# Patient Record
Sex: Female | Born: 1960 | Race: Black or African American | Hispanic: No | Marital: Single | Smoking: Never smoker
Health system: Southern US, Community
[De-identification: ages and names within clinical notes are randomized; demographics above are authoritative.]

## PROBLEM LIST (undated history)

## (undated) DIAGNOSIS — E039 Hypothyroidism, unspecified: Secondary | ICD-10-CM

## (undated) DIAGNOSIS — E079 Disorder of thyroid, unspecified: Secondary | ICD-10-CM

## (undated) DIAGNOSIS — K219 Gastro-esophageal reflux disease without esophagitis: Secondary | ICD-10-CM

## (undated) DIAGNOSIS — E785 Hyperlipidemia, unspecified: Secondary | ICD-10-CM

## (undated) DIAGNOSIS — G4733 Obstructive sleep apnea (adult) (pediatric): Secondary | ICD-10-CM

## (undated) DIAGNOSIS — M1990 Unspecified osteoarthritis, unspecified site: Secondary | ICD-10-CM

## (undated) HISTORY — PX: CHOLECYSTECTOMY: SHX55

## (undated) HISTORY — PX: ABDOMINAL HYSTERECTOMY: SHX81

---

## 1998-03-23 ENCOUNTER — Other Ambulatory Visit: Admission: RE | Admit: 1998-03-23 | Discharge: 1998-03-23 | Payer: Self-pay | Admitting: Internal Medicine

## 1998-05-13 ENCOUNTER — Other Ambulatory Visit: Admission: RE | Admit: 1998-05-13 | Discharge: 1998-05-13 | Payer: Self-pay | Admitting: Family Medicine

## 1998-08-08 ENCOUNTER — Emergency Department (HOSPITAL_COMMUNITY): Admission: EM | Admit: 1998-08-08 | Discharge: 1998-08-08 | Payer: Self-pay | Admitting: Emergency Medicine

## 1998-08-09 ENCOUNTER — Inpatient Hospital Stay (HOSPITAL_COMMUNITY): Admission: EM | Admit: 1998-08-09 | Discharge: 1998-08-12 | Payer: Self-pay | Admitting: Emergency Medicine

## 1998-11-03 ENCOUNTER — Emergency Department (HOSPITAL_COMMUNITY): Admission: EM | Admit: 1998-11-03 | Discharge: 1998-11-03 | Payer: Self-pay | Admitting: Emergency Medicine

## 1998-11-03 ENCOUNTER — Encounter: Payer: Self-pay | Admitting: Emergency Medicine

## 1999-10-02 ENCOUNTER — Emergency Department (HOSPITAL_COMMUNITY): Admission: EM | Admit: 1999-10-02 | Discharge: 1999-10-02 | Payer: Self-pay | Admitting: Emergency Medicine

## 2000-01-20 ENCOUNTER — Other Ambulatory Visit: Admission: RE | Admit: 2000-01-20 | Discharge: 2000-01-20 | Payer: Self-pay | Admitting: Family Medicine

## 2000-02-29 ENCOUNTER — Emergency Department (HOSPITAL_COMMUNITY): Admission: EM | Admit: 2000-02-29 | Discharge: 2000-02-29 | Payer: Self-pay | Admitting: Emergency Medicine

## 2001-12-19 ENCOUNTER — Emergency Department (HOSPITAL_COMMUNITY): Admission: EM | Admit: 2001-12-19 | Discharge: 2001-12-19 | Payer: Self-pay

## 2003-05-11 ENCOUNTER — Emergency Department (HOSPITAL_COMMUNITY): Admission: EM | Admit: 2003-05-11 | Discharge: 2003-05-11 | Payer: Self-pay | Admitting: Emergency Medicine

## 2003-05-11 ENCOUNTER — Encounter: Payer: Self-pay | Admitting: *Deleted

## 2003-09-14 ENCOUNTER — Emergency Department (HOSPITAL_COMMUNITY): Admission: AD | Admit: 2003-09-14 | Discharge: 2003-09-14 | Payer: Self-pay | Admitting: Emergency Medicine

## 2003-09-14 ENCOUNTER — Encounter: Payer: Self-pay | Admitting: Emergency Medicine

## 2004-08-23 ENCOUNTER — Ambulatory Visit: Payer: Self-pay | Admitting: Family Medicine

## 2004-08-24 ENCOUNTER — Ambulatory Visit (HOSPITAL_COMMUNITY): Admission: RE | Admit: 2004-08-24 | Discharge: 2004-08-24 | Payer: Self-pay | Admitting: Internal Medicine

## 2004-09-19 ENCOUNTER — Emergency Department (HOSPITAL_COMMUNITY): Admission: EM | Admit: 2004-09-19 | Discharge: 2004-09-20 | Payer: Self-pay | Admitting: Emergency Medicine

## 2005-03-02 ENCOUNTER — Emergency Department (HOSPITAL_COMMUNITY): Admission: EM | Admit: 2005-03-02 | Discharge: 2005-03-02 | Payer: Self-pay | Admitting: Emergency Medicine

## 2005-05-29 ENCOUNTER — Ambulatory Visit: Payer: Self-pay | Admitting: Family Medicine

## 2005-08-14 ENCOUNTER — Ambulatory Visit: Payer: Self-pay | Admitting: Family Medicine

## 2005-11-15 ENCOUNTER — Ambulatory Visit: Payer: Self-pay | Admitting: Family Medicine

## 2006-02-01 ENCOUNTER — Ambulatory Visit: Payer: Self-pay | Admitting: Family Medicine

## 2006-03-19 ENCOUNTER — Ambulatory Visit: Payer: Self-pay | Admitting: Family Medicine

## 2006-05-18 ENCOUNTER — Ambulatory Visit: Payer: Self-pay | Admitting: Family Medicine

## 2006-06-26 ENCOUNTER — Ambulatory Visit: Payer: Self-pay | Admitting: Family Medicine

## 2006-07-17 ENCOUNTER — Emergency Department (HOSPITAL_COMMUNITY): Admission: EM | Admit: 2006-07-17 | Discharge: 2006-07-17 | Payer: Self-pay | Admitting: Emergency Medicine

## 2006-10-03 ENCOUNTER — Ambulatory Visit: Payer: Self-pay | Admitting: Family Medicine

## 2006-10-08 ENCOUNTER — Ambulatory Visit (HOSPITAL_COMMUNITY): Admission: RE | Admit: 2006-10-08 | Discharge: 2006-10-08 | Payer: Self-pay | Admitting: Internal Medicine

## 2006-10-11 ENCOUNTER — Ambulatory Visit: Payer: Self-pay | Admitting: Family Medicine

## 2006-11-29 ENCOUNTER — Ambulatory Visit: Payer: Self-pay | Admitting: Obstetrics and Gynecology

## 2007-01-31 ENCOUNTER — Ambulatory Visit: Payer: Self-pay | Admitting: Obstetrics & Gynecology

## 2007-03-25 ENCOUNTER — Ambulatory Visit: Payer: Self-pay | Admitting: Family Medicine

## 2007-04-11 ENCOUNTER — Ambulatory Visit: Payer: Self-pay | Admitting: Family Medicine

## 2007-04-25 ENCOUNTER — Ambulatory Visit: Payer: Self-pay | Admitting: Family Medicine

## 2007-05-27 ENCOUNTER — Ambulatory Visit: Payer: Self-pay | Admitting: Family Medicine

## 2007-06-26 ENCOUNTER — Ambulatory Visit: Payer: Self-pay | Admitting: Obstetrics and Gynecology

## 2007-07-02 ENCOUNTER — Ambulatory Visit (HOSPITAL_COMMUNITY): Admission: RE | Admit: 2007-07-02 | Discharge: 2007-07-02 | Payer: Self-pay | Admitting: Obstetrics and Gynecology

## 2007-07-03 ENCOUNTER — Ambulatory Visit: Payer: Self-pay | Admitting: Obstetrics & Gynecology

## 2007-08-08 ENCOUNTER — Ambulatory Visit: Payer: Self-pay | Admitting: Gynecology

## 2007-08-08 ENCOUNTER — Other Ambulatory Visit: Admission: RE | Admit: 2007-08-08 | Discharge: 2007-08-08 | Payer: Self-pay | Admitting: Gynecology

## 2007-08-08 ENCOUNTER — Encounter (INDEPENDENT_AMBULATORY_CARE_PROVIDER_SITE_OTHER): Payer: Self-pay | Admitting: Gynecology

## 2007-08-17 ENCOUNTER — Emergency Department (HOSPITAL_COMMUNITY): Admission: EM | Admit: 2007-08-17 | Discharge: 2007-08-17 | Payer: Self-pay | Admitting: Emergency Medicine

## 2007-08-26 ENCOUNTER — Emergency Department (HOSPITAL_COMMUNITY): Admission: EM | Admit: 2007-08-26 | Discharge: 2007-08-26 | Payer: Self-pay | Admitting: Emergency Medicine

## 2007-09-04 ENCOUNTER — Ambulatory Visit: Payer: Self-pay | Admitting: Internal Medicine

## 2007-09-16 ENCOUNTER — Ambulatory Visit: Payer: Self-pay | Admitting: Gynecology

## 2007-09-16 ENCOUNTER — Inpatient Hospital Stay (HOSPITAL_COMMUNITY): Admission: RE | Admit: 2007-09-16 | Discharge: 2007-09-18 | Payer: Self-pay | Admitting: Gynecology

## 2007-09-16 ENCOUNTER — Encounter (INDEPENDENT_AMBULATORY_CARE_PROVIDER_SITE_OTHER): Payer: Self-pay | Admitting: Gynecology

## 2007-09-19 ENCOUNTER — Ambulatory Visit: Payer: Self-pay | Admitting: *Deleted

## 2007-09-19 ENCOUNTER — Inpatient Hospital Stay (HOSPITAL_COMMUNITY): Admission: AD | Admit: 2007-09-19 | Discharge: 2007-09-19 | Payer: Self-pay | Admitting: Family Medicine

## 2007-10-02 ENCOUNTER — Ambulatory Visit: Payer: Self-pay | Admitting: Obstetrics & Gynecology

## 2007-10-30 ENCOUNTER — Ambulatory Visit: Payer: Self-pay | Admitting: Obstetrics & Gynecology

## 2008-01-20 ENCOUNTER — Ambulatory Visit: Payer: Self-pay | Admitting: Internal Medicine

## 2008-02-05 ENCOUNTER — Ambulatory Visit: Payer: Self-pay | Admitting: Family Medicine

## 2008-07-24 ENCOUNTER — Inpatient Hospital Stay (HOSPITAL_COMMUNITY): Admission: EM | Admit: 2008-07-24 | Discharge: 2008-07-26 | Payer: Self-pay | Admitting: Emergency Medicine

## 2008-08-05 ENCOUNTER — Ambulatory Visit: Payer: Self-pay | Admitting: Obstetrics & Gynecology

## 2008-08-11 ENCOUNTER — Ambulatory Visit (HOSPITAL_COMMUNITY): Admission: RE | Admit: 2008-08-11 | Discharge: 2008-08-11 | Payer: Self-pay | Admitting: Obstetrics and Gynecology

## 2008-09-01 ENCOUNTER — Ambulatory Visit: Payer: Self-pay | Admitting: Internal Medicine

## 2008-09-01 LAB — CONVERTED CEMR LAB
Cholesterol: 224 mg/dL — ABNORMAL HIGH (ref 0–200)
HDL: 48 mg/dL (ref >39)
LDL Cholesterol: 146 mg/dL — ABNORMAL HIGH (ref 0–99)
TSH: 25.069 microintl units/mL — ABNORMAL HIGH (ref 0.350–4.50)
Total CHOL/HDL Ratio: 4.7
Triglycerides: 148 mg/dL (ref <150)
VLDL: 30 mg/dL (ref 0–40)

## 2008-10-08 ENCOUNTER — Ambulatory Visit: Payer: Self-pay | Admitting: Obstetrics and Gynecology

## 2009-01-30 ENCOUNTER — Emergency Department (HOSPITAL_COMMUNITY): Admission: EM | Admit: 2009-01-30 | Discharge: 2009-01-30 | Payer: Self-pay | Admitting: Emergency Medicine

## 2009-02-16 ENCOUNTER — Ambulatory Visit: Payer: Self-pay | Admitting: Sports Medicine

## 2009-02-16 DIAGNOSIS — E039 Hypothyroidism, unspecified: Secondary | ICD-10-CM | POA: Insufficient documentation

## 2009-02-16 DIAGNOSIS — S92919A Unspecified fracture of unspecified toe(s), initial encounter for closed fracture: Secondary | ICD-10-CM | POA: Insufficient documentation

## 2009-02-17 ENCOUNTER — Ambulatory Visit (HOSPITAL_COMMUNITY): Admission: RE | Admit: 2009-02-17 | Discharge: 2009-02-17 | Payer: Self-pay | Admitting: *Deleted

## 2009-03-02 ENCOUNTER — Ambulatory Visit: Payer: Self-pay | Admitting: Sports Medicine

## 2009-03-18 ENCOUNTER — Ambulatory Visit (HOSPITAL_COMMUNITY): Admission: RE | Admit: 2009-03-18 | Discharge: 2009-03-18 | Payer: Self-pay | Admitting: *Deleted

## 2009-03-18 ENCOUNTER — Ambulatory Visit: Payer: Self-pay | Admitting: Sports Medicine

## 2009-03-23 ENCOUNTER — Ambulatory Visit: Payer: Self-pay | Admitting: Family Medicine

## 2009-03-23 LAB — CONVERTED CEMR LAB
ALT: 13 units/L (ref 0–35)
AST: 13 units/L (ref 0–37)
Albumin: 3.8 g/dL (ref 3.5–5.2)
Alkaline Phosphatase: 28 units/L — ABNORMAL LOW (ref 39–117)
BUN: 11 mg/dL (ref 6–23)
CO2: 20 meq/L (ref 19–32)
Calcium: 9.3 mg/dL (ref 8.4–10.5)
Chloride: 109 meq/L (ref 96–112)
Creatinine, Ser: 0.71 mg/dL (ref 0.40–1.20)
Glucose, Bld: 101 mg/dL — ABNORMAL HIGH (ref 70–99)
Potassium: 3.7 meq/L (ref 3.5–5.3)
Sodium: 140 meq/L (ref 135–145)
TSH: 0.01 microintl units/mL — ABNORMAL LOW (ref 0.350–4.500)
Total Bilirubin: 0.3 mg/dL (ref 0.3–1.2)
Total Protein: 7.2 g/dL (ref 6.0–8.3)
Vit D, 25-Hydroxy: 6 ng/mL — ABNORMAL LOW (ref 30–89)

## 2009-04-01 ENCOUNTER — Ambulatory Visit (HOSPITAL_COMMUNITY): Admission: RE | Admit: 2009-04-01 | Discharge: 2009-04-01 | Payer: Self-pay | Admitting: *Deleted

## 2009-04-01 ENCOUNTER — Ambulatory Visit: Payer: Self-pay | Admitting: Sports Medicine

## 2009-04-15 ENCOUNTER — Ambulatory Visit: Payer: Self-pay | Admitting: Sports Medicine

## 2009-04-27 ENCOUNTER — Ambulatory Visit (HOSPITAL_COMMUNITY): Admission: RE | Admit: 2009-04-27 | Discharge: 2009-04-27 | Payer: Self-pay | Admitting: *Deleted

## 2009-04-27 ENCOUNTER — Ambulatory Visit: Payer: Self-pay | Admitting: Family Medicine

## 2009-04-28 ENCOUNTER — Encounter (INDEPENDENT_AMBULATORY_CARE_PROVIDER_SITE_OTHER): Payer: Self-pay | Admitting: Adult Health

## 2009-04-28 LAB — CONVERTED CEMR LAB
ALT: 18 units/L (ref 0–35)
AST: 18 units/L (ref 0–37)
Albumin: 4.1 g/dL (ref 3.5–5.2)
Alkaline Phosphatase: 32 units/L — ABNORMAL LOW (ref 39–117)
BUN: 10 mg/dL (ref 6–23)
Basophils Absolute: 0 10*3/uL (ref 0.0–0.1)
Basophils Relative: 0 % (ref 0–1)
CO2: 22 meq/L (ref 19–32)
Calcium: 9.6 mg/dL (ref 8.4–10.5)
Chloride: 103 meq/L (ref 96–112)
Cholesterol: 188 mg/dL (ref 0–200)
Creatinine, Ser: 0.64 mg/dL (ref 0.40–1.20)
Eosinophils Absolute: 0.1 10*3/uL (ref 0.0–0.7)
Eosinophils Relative: 2 % (ref 0–5)
Glucose, Bld: 38 mg/dL — CL (ref 70–99)
HCT: 39.2 % (ref 36.0–46.0)
HDL: 58 mg/dL (ref >39)
Hemoglobin: 11.5 g/dL — ABNORMAL LOW (ref 12.0–15.0)
LDL Cholesterol: 111 mg/dL — ABNORMAL HIGH (ref 0–99)
Lymphocytes Relative: 40 % (ref 12–46)
Lymphs Abs: 3.3 10*3/uL (ref 0.7–4.0)
MCHC: 29.3 g/dL — ABNORMAL LOW (ref 30.0–36.0)
MCV: 81.3 fL (ref 78.0–100.0)
Monocytes Absolute: 0.3 10*3/uL (ref 0.1–1.0)
Monocytes Relative: 4 % (ref 3–12)
Neutro Abs: 4.4 10*3/uL (ref 1.7–7.7)
Neutrophils Relative %: 55 % (ref 43–77)
Platelets: 262 10*3/uL (ref 150–400)
Potassium: 4.2 meq/L (ref 3.5–5.3)
RBC: 4.82 M/uL (ref 3.87–5.11)
RDW: 15.9 % — ABNORMAL HIGH (ref 11.5–15.5)
Sodium: 142 meq/L (ref 135–145)
T4, Total: 4.5 ug/dL — ABNORMAL LOW (ref 5.0–12.5)
TSH: 1.442 microintl units/mL (ref 0.350–4.500)
Total Bilirubin: 0.3 mg/dL (ref 0.3–1.2)
Total CHOL/HDL Ratio: 3.2
Total Protein: 7.8 g/dL (ref 6.0–8.3)
Triglycerides: 95 mg/dL (ref <150)
VLDL: 19 mg/dL (ref 0–40)
WBC: 8.1 10*3/uL (ref 4.0–10.5)

## 2009-05-04 ENCOUNTER — Emergency Department (HOSPITAL_COMMUNITY): Admission: EM | Admit: 2009-05-04 | Discharge: 2009-05-04 | Payer: Self-pay | Admitting: Family Medicine

## 2009-05-13 ENCOUNTER — Ambulatory Visit: Payer: Self-pay | Admitting: Sports Medicine

## 2009-05-22 ENCOUNTER — Emergency Department (HOSPITAL_COMMUNITY): Admission: EM | Admit: 2009-05-22 | Discharge: 2009-05-22 | Payer: Self-pay | Admitting: Emergency Medicine

## 2009-06-08 ENCOUNTER — Ambulatory Visit: Payer: Self-pay | Admitting: Family Medicine

## 2009-06-09 ENCOUNTER — Ambulatory Visit (HOSPITAL_COMMUNITY): Admission: RE | Admit: 2009-06-09 | Discharge: 2009-06-09 | Payer: Self-pay | Admitting: Internal Medicine

## 2009-07-28 ENCOUNTER — Encounter (INDEPENDENT_AMBULATORY_CARE_PROVIDER_SITE_OTHER): Payer: Self-pay | Admitting: Family Medicine

## 2009-07-28 ENCOUNTER — Ambulatory Visit: Payer: Self-pay | Admitting: Internal Medicine

## 2009-07-28 LAB — CONVERTED CEMR LAB
Microalb, Ur: 0.81 mg/dL (ref 0.00–1.89)
T3 Uptake Ratio: 34.6 % (ref 22.5–37.0)
T4, Total: 7.2 ug/dL (ref 5.0–12.5)
TSH: 8.518 microintl units/mL — ABNORMAL HIGH (ref 0.350–4.500)

## 2009-08-09 ENCOUNTER — Ambulatory Visit (HOSPITAL_COMMUNITY): Admission: RE | Admit: 2009-08-09 | Discharge: 2009-08-09 | Payer: Self-pay | Admitting: Internal Medicine

## 2009-08-11 ENCOUNTER — Ambulatory Visit (HOSPITAL_COMMUNITY): Admission: RE | Admit: 2009-08-11 | Discharge: 2009-08-11 | Payer: Self-pay | Admitting: Internal Medicine

## 2009-08-27 ENCOUNTER — Ambulatory Visit: Payer: Self-pay | Admitting: Internal Medicine

## 2009-08-27 ENCOUNTER — Telehealth (INDEPENDENT_AMBULATORY_CARE_PROVIDER_SITE_OTHER): Payer: Self-pay | Admitting: *Deleted

## 2009-09-10 ENCOUNTER — Ambulatory Visit: Payer: Self-pay | Admitting: Internal Medicine

## 2009-11-02 ENCOUNTER — Encounter (INDEPENDENT_AMBULATORY_CARE_PROVIDER_SITE_OTHER): Payer: Self-pay | Admitting: Adult Health

## 2009-11-02 ENCOUNTER — Ambulatory Visit: Payer: Self-pay | Admitting: Internal Medicine

## 2009-11-02 LAB — CONVERTED CEMR LAB
ALT: 14 units/L (ref 0–35)
AST: 15 units/L (ref 0–37)
Albumin: 4.3 g/dL (ref 3.5–5.2)
Alkaline Phosphatase: 23 units/L — ABNORMAL LOW (ref 39–117)
BUN: 11 mg/dL (ref 6–23)
CO2: 21 meq/L (ref 19–32)
Calcium: 9.7 mg/dL (ref 8.4–10.5)
Chloride: 103 meq/L (ref 96–112)
Cholesterol: 125 mg/dL (ref 0–200)
Creatinine, Ser: 0.75 mg/dL (ref 0.40–1.20)
Glucose, Bld: 78 mg/dL (ref 70–99)
HDL: 46 mg/dL (ref >39)
LDL Cholesterol: 61 mg/dL (ref 0–99)
Potassium: 4 meq/L (ref 3.5–5.3)
Sodium: 137 meq/L (ref 135–145)
T3 Uptake Ratio: 29.5 % (ref 22.5–37.0)
TSH: 7.625 microintl units/mL — ABNORMAL HIGH (ref 0.350–4.500)
Total Bilirubin: 0.4 mg/dL (ref 0.3–1.2)
Total CHOL/HDL Ratio: 2.7
Total Protein: 7.7 g/dL (ref 6.0–8.3)
Triglycerides: 90 mg/dL (ref <150)
VLDL: 18 mg/dL (ref 0–40)

## 2009-12-30 ENCOUNTER — Encounter (INDEPENDENT_AMBULATORY_CARE_PROVIDER_SITE_OTHER): Payer: Self-pay | Admitting: Adult Health

## 2009-12-30 ENCOUNTER — Ambulatory Visit: Payer: Self-pay | Admitting: Internal Medicine

## 2009-12-30 LAB — CONVERTED CEMR LAB
T4, Total: 6.4 ug/dL (ref 5.0–12.5)
TSH: 12.641 microintl units/mL — ABNORMAL HIGH (ref 0.350–4.500)

## 2010-04-26 ENCOUNTER — Encounter (INDEPENDENT_AMBULATORY_CARE_PROVIDER_SITE_OTHER): Payer: Self-pay | Admitting: Adult Health

## 2010-04-26 ENCOUNTER — Ambulatory Visit: Payer: Self-pay | Admitting: Family Medicine

## 2010-04-26 LAB — CONVERTED CEMR LAB
ALT: 16 units/L (ref 0–35)
AST: 16 units/L (ref 0–37)
Albumin: 4.1 g/dL (ref 3.5–5.2)
Alkaline Phosphatase: 33 units/L — ABNORMAL LOW (ref 39–117)
BUN: 11 mg/dL (ref 6–23)
CO2: 23 meq/L (ref 19–32)
Calcium: 9.2 mg/dL (ref 8.4–10.5)
Chloride: 106 meq/L (ref 96–112)
Cholesterol: 143 mg/dL (ref 0–200)
Creatinine, Ser: 0.73 mg/dL (ref 0.40–1.20)
Free T4: 1.36 ng/dL (ref 0.80–1.80)
Glucose, Bld: 72 mg/dL (ref 70–99)
HDL: 47 mg/dL (ref >39)
LDL Cholesterol: 67 mg/dL (ref 0–99)
Potassium: 4.1 meq/L (ref 3.5–5.3)
Sodium: 140 meq/L (ref 135–145)
TSH: 0.105 microintl units/mL — ABNORMAL LOW (ref 0.350–4.500)
Total Bilirubin: 0.2 mg/dL — ABNORMAL LOW (ref 0.3–1.2)
Total CHOL/HDL Ratio: 3
Total Protein: 7.5 g/dL (ref 6.0–8.3)
Triglycerides: 144 mg/dL (ref <150)
VLDL: 29 mg/dL (ref 0–40)
Vit D, 25-Hydroxy: 72 ng/mL (ref 30–89)

## 2010-06-02 ENCOUNTER — Ambulatory Visit: Payer: Self-pay | Admitting: Family Medicine

## 2010-09-08 ENCOUNTER — Emergency Department (HOSPITAL_COMMUNITY): Admission: EM | Admit: 2010-09-08 | Discharge: 2010-09-08 | Payer: Self-pay | Admitting: Family Medicine

## 2010-10-21 ENCOUNTER — Emergency Department (HOSPITAL_COMMUNITY): Admission: EM | Admit: 2010-10-21 | Discharge: 2010-10-21 | Payer: Self-pay | Admitting: Emergency Medicine

## 2010-11-04 ENCOUNTER — Emergency Department (HOSPITAL_COMMUNITY): Admission: EM | Admit: 2010-11-04 | Discharge: 2010-11-04 | Payer: Self-pay | Admitting: Family Medicine

## 2011-01-19 NOTE — Assessment & Plan Note (Signed)
Summary: fu foot/jw   Vital Signs:  Patient profile:   50 year old female BP sitting:   110 / 78  Vitals Entered By: Lillia Pauls CMA (April 15, 2009 2:50 PM)  History of Present Illness: Left great toe fracture f/up - Still having pain, but this is improved.  Pain located at left great toe IP joint.  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  It is improved.  Allergies: No Known Drug Allergies  Physical Exam  General:  Well-developed,well-nourished,in no acute distress; alert,appropriate and cooperative throughout examination Msk:  L 1st toe with swelling of proximal phalanx, mildly TTP at PIP joint line, but nontender proximal phalanx.  Normal alignment with no gross deformity.  Minimal movement of 1st mtp and ip joint. Remainder of toes are normal. No tenderness of metatarsals.  Normal L ankle ROM and without instability.   Impression & Recommendations:  Problem # 1:  FRACTURE, GREAT TOE, LEFT (ICD-826.0) Assessment Improved We will get an x-ray at next visit in 2 weeks, so she was given this order now.  In the mean time, she can try to progress to wearing a regular shoe if this doesn't cause pain.  Future Orders: Diagnostic X-Ray/Fluoroscopy (Diagnostic X-Ray/Flu) ... 04/27/2009  Complete Medication List: 1)  Omeprazole 20 Mg Cpdr (Omeprazole) .... One by mouth daily 2)  Lipitor  3)  Synthroid

## 2011-01-19 NOTE — Assessment & Plan Note (Signed)
Summary: FU FOOT/JW   Vital Signs:  Patient profile:   50 year old female BP sitting:   120 / 76  Vitals Entered By: Lillia Pauls CMA (May 13, 2009 1:42 PM)  History of Present Illness: 50 y/o AF here for f/up of left great toe fracture:  Doing well.  No pain.  Still wearing cast shoe on most days and buddy taping.  No complaints.  Allergies: No Known Drug Allergies  Physical Exam  General:  Well-developed,well-nourished,in no acute distress; alert,appropriate and cooperative throughout examination Msk:  Left foot:  No tenderness of left great toe.  Normal appearing without any erythema or warmth.  Decreased ROM of IP jiont, but normal MP joint ROM and strength.   Impression & Recommendations:  Problem # 1:  FRACTURE, GREAT TOE, LEFT (ICD-826.0) Assessment Improved Doing well.  Follow-up as needed.  No more treatment indicated.  can go back to wearing a regular shoe.  Complete Medication List: 1)  Omeprazole 20 Mg Cpdr (Omeprazole) .... One by mouth daily 2)  Lipitor  3)  Synthroid

## 2011-01-19 NOTE — Progress Notes (Signed)
Summary: Back pain  Phone Note Call from Patient   Caller: Patient Reason for Call: Talk to Nurse Action Taken: Appt Scheduled Today Summary of Call: patient states she had been having back pain for 1 week...states she has not done any lifting and doesn't know of any injury...patient is obese and states it hurts when lying down and then she can't get up...she states the pain is in her mid back and radiated to the left and right side...patient denies any burning or frequency with urination. Initial call taken by: Conchita Paris,  August 27, 2009 11:03 AM

## 2011-01-19 NOTE — Assessment & Plan Note (Signed)
Summary: FU FOOT PAIN/JW   Vital Signs:  Patient profile:   50 year old female Height:      57 inches Weight:      186 pounds BMI:     40.40 Pulse rate:   74 / minute BP sitting:   130 / 81  Vitals Entered By: Lillia Pauls CMA (March 02, 2009 2:48 PM) CC: f/u l 1st toe proximal phalanx fx   History of Present Illness: F/u from L 1st toe proximal phalanx fx (date of injury 01/30/09, now 4.5 weeks out) Wearing post op shoe and buddy taping.  Still with some pain in this toe, particularly at night when not wearing post op shoe.  X-rays on 02/17/09 showed improved alignment from films in ED taken prior to ED reduction, but no interval healing  Allergies: No Known Drug Allergies  Physical Exam  General:  Well-developed,well-nourished,in no acute distress; alert,appropriate and cooperative throughout examination Msk:  L 1st toe with swelling of proximal phalanx, mildly TTP.  Normal alignment with no gross deformity.  Minimal movement of 1st mtp and ip joint. Remainder of toes are normal. No tenderness of metatarsals.  Normal L ankle ROM and without instability. Skin:  no lesions or rashes   Impression & Recommendations:  Problem # 1:  FRACTURE, GREAT TOE, LEFT (ICD-826.0) Assessment Unchanged Still painful, so will continue buddy taping and post op shoe. F/u in 2 weeks with repeat x-rays prior to this visit. She will be 6 weeks out at this point.  If not improving may require referral to ortho.  Complete Medication List: 1)  Omeprazole 20 Mg Cpdr (Omeprazole) .... One by mouth daily 2)  Lipitor  3)  Synthroid   Other Orders: Radiology other (Radiology Other)

## 2011-01-19 NOTE — Assessment & Plan Note (Signed)
Summary: FU FOOT/JW   Vital Signs:  Patient profile:   50 year old female Pulse rate:   93 / minute BP sitting:   113 / 73  Vitals Entered By: Lillia Pauls CMA (March 18, 2009 1:49 PM)  History of Present Illness: 50 y/o AAF with left great toe fracture:  She is doing a little better.  Still wearing post-op shoe.  Still having pain with prolonged standing or being stepped on.  Allergies: No Known Drug Allergies  Physical Exam  General:  Well-developed,well-nourished,in no acute distress; alert,appropriate and cooperative throughout examination obese Msk:  L 1st toe with swelling of proximal phalanx, mildly TTP at joint line and proximal phalanx.  Normal alignment with no gross deformity.  Minimal movement of 1st mtp and ip joint. Remainder of toes are normal. No tenderness of metatarsals.  Normal L ankle ROM and without instability.   Impression & Recommendations:  Problem # 1:  FRACTURE, GREAT TOE, LEFT (ICD-826.0) Assessment Improved Continue to follow.  Her symptoms and x-ray healing are a little slow to resolve.  We will follow 1 q2weeks until significant pain improvement and/or more bony callus formation.  Orders: Diagnostic X-Ray/Fluoroscopy (Diagnostic X-Ray/Flu)  Complete Medication List: 1)  Omeprazole 20 Mg Cpdr (Omeprazole) .... One by mouth daily 2)  Lipitor  3)  Synthroid

## 2011-01-19 NOTE — Assessment & Plan Note (Signed)
Summary: FU L GREAT TOE FX   Vital Signs:  Patient profile:   50 year old female BP sitting:   110 / 70  Vitals Entered By: Lillia Pauls CMA (April 01, 2009 2:49 PM)  History of Present Illness: Clearly feels that great toe on RT hurts less She has faithfully used her cast shoe not much swelling now uses buddy taping  comes for Follw up  Allergies: No Known Drug Allergies  Physical Exam  General:  Well-developed,well-nourished,in no acute distress; alert,appropriate and cooperative throughout examination Msk:  Lt great toe has palp stepoff in prox phalanx this is only very mildly TTP Rotation and angulation of LT great  toe is mildly increased vs RT great toe mild swelling if any no tenderness on palpation of 2nd MT and phalnges first MT and MTP or distal phalanx of first toe on LT  Review of XRay showed mild angulation mild impaction slight rotation there is less distinction of bone margins suggeting early phase of healing   Impression & Recommendations:  Problem # 1:  FRACTURE, GREAT TOE, LEFT (ICD-826.0) this is healing slowly 2/2 degree of angulation  clinically making good progress  ck again in 2 weeks  If better progress to shoe and re XRay in 2 to 4 weeks  Complete Medication List: 1)  Omeprazole 20 Mg Cpdr (Omeprazole) .... One by mouth daily 2)  Lipitor  3)  Synthroid

## 2011-01-19 NOTE — Assessment & Plan Note (Signed)
Summary: NP W/TOE FRACTURE/JW   Vital Signs:  Patient Profile:   50 Years Old Female Weight:      186 pounds Pulse rate:   87 / minute BP sitting:   120 / 63  Vitals Entered By: Lillia Pauls CMA (February 16, 2009 3:30 PM)                                      Chief Complaint:  L 1st toe fx.  History of Present Illness: Injured L great toe on feb 13th after tripping walking dog.  She had a gross deformity and went to ED where she had a oblique fx of her L 1st toe proximal phalanx.  This was reduced by the ED physician, and she has been buddy taping this and wearing a post-op shoe since that time.  Denies any current pain and feels she is much improved. There was no open fx or breaks in the skin.    Current Allergies: No known allergies   Past Medical History:    Hypothyroidism      Physical Exam  General:     Well-developed,well-nourished,in no acute distress; alert,appropriate and cooperative throughout examination Msk:     L 1st toe with swelling of proximal phalanx, mildly TTP.  Normal alignment with no gross deformity.  Minimal movement of 1st mtp and ip joint. Remainder of toes are normal. No tenderness of metatarsals.  Normal L ankle ROM and without instability. Pulses:     brisk capillary refill Extremities:     no edema Neurologic:     intact sensation Skin:     no lesions or rashes Additional Exam:     3 view L 1st toe x-rays (pre-reduction) were reviewed and show an oblique fx of the proximal phalanx with minimal inolvement of the IP joint    Impression & Recommendations:  Problem # 1:  FRACTURE, GREAT TOE, LEFT (ICD-826.0) Assessment: New Will obtain repeat x-ray of her 1st toe to ensure no malalignment  Continue buddy taping and post op shoe until f/u in 2-3 weeks.  Will likely be able to discontinue buddy taping and post op shoe at that visit if still doing well. Orders: Radiology other (Radiology Other)   Complete  Medication List: 1)  Omeprazole 20 Mg Cpdr (Omeprazole) .... One by mouth daily 2)  Lipitor  3)  Synthroid     Prescriptions: OMEPRAZOLE 20 MG CPDR (OMEPRAZOLE) one by mouth daily  #90 x 3   Entered and Authorized by:   Benn Moulder MD   Signed by:   Benn Moulder MD on 02/16/2009   Method used:   Historical   RxID:   [id number]

## 2011-02-28 ENCOUNTER — Encounter (INDEPENDENT_AMBULATORY_CARE_PROVIDER_SITE_OTHER): Payer: Self-pay | Admitting: *Deleted

## 2011-02-28 LAB — CONVERTED CEMR LAB
ALT: 15 units/L (ref 0–35)
AST: 16 units/L (ref 0–37)
Albumin: 4.1 g/dL (ref 3.5–5.2)
Alkaline Phosphatase: 25 units/L — ABNORMAL LOW (ref 39–117)
BUN: 10 mg/dL (ref 6–23)
CO2: 28 meq/L (ref 19–32)
Calcium: 9.8 mg/dL (ref 8.4–10.5)
Chloride: 101 meq/L (ref 96–112)
Cholesterol: 182 mg/dL (ref 0–200)
Creatinine, Ser: 0.71 mg/dL (ref 0.40–1.20)
Glucose, Bld: 85 mg/dL (ref 70–99)
HDL: 52 mg/dL (ref >39)
LDL Cholesterol: 113 mg/dL — ABNORMAL HIGH (ref 0–99)
Potassium: 4.8 meq/L (ref 3.5–5.3)
Sodium: 140 meq/L (ref 135–145)
TSH: 0.215 microintl units/mL — ABNORMAL LOW (ref 0.350–4.500)
Total Bilirubin: 0.4 mg/dL (ref 0.3–1.2)
Total CHOL/HDL Ratio: 3.5
Total Protein: 7.5 g/dL (ref 6.0–8.3)
Triglycerides: 85 mg/dL (ref <150)
VLDL: 17 mg/dL (ref 0–40)

## 2011-03-01 LAB — DIFFERENTIAL
Basophils Absolute: 0 10*3/uL (ref 0.0–0.1)
Basophils Relative: 0 % (ref 0–1)
Eosinophils Absolute: 0.1 10*3/uL (ref 0.0–0.7)
Eosinophils Relative: 1 % (ref 0–5)
Lymphocytes Relative: 25 % (ref 12–46)
Lymphs Abs: 2.3 10*3/uL (ref 0.7–4.0)
Monocytes Absolute: 0.6 10*3/uL (ref 0.1–1.0)
Monocytes Relative: 7 % (ref 3–12)
Neutro Abs: 6.2 10*3/uL (ref 1.7–7.7)
Neutrophils Relative %: 67 % (ref 43–77)

## 2011-03-01 LAB — COMPREHENSIVE METABOLIC PANEL
ALT: 19 U/L (ref 0–35)
AST: 21 U/L (ref 0–37)
Albumin: 3.5 g/dL (ref 3.5–5.2)
Alkaline Phosphatase: 28 U/L — ABNORMAL LOW (ref 39–117)
BUN: 8 mg/dL (ref 6–23)
CO2: 25 mEq/L (ref 19–32)
Calcium: 9.4 mg/dL (ref 8.4–10.5)
Chloride: 110 mEq/L (ref 96–112)
Creatinine, Ser: 0.8 mg/dL (ref 0.4–1.2)
GFR calc Af Amer: >60 mL/min (ref >60)
GFR calc non Af Amer: >60 mL/min (ref >60)
Glucose, Bld: 106 mg/dL — ABNORMAL HIGH (ref 70–99)
Potassium: 4 mEq/L (ref 3.5–5.1)
Sodium: 141 mEq/L (ref 135–145)
Total Bilirubin: 0.3 mg/dL (ref 0.3–1.2)
Total Protein: 7.6 g/dL (ref 6.0–8.3)

## 2011-03-01 LAB — CBC
HCT: 35.5 % — ABNORMAL LOW (ref 36.0–46.0)
Hemoglobin: 11.4 g/dL — ABNORMAL LOW (ref 12.0–15.0)
MCH: 23.8 pg — ABNORMAL LOW (ref 26.0–34.0)
MCHC: 32.1 g/dL (ref 30.0–36.0)
MCV: 74.3 fL — ABNORMAL LOW (ref 78.0–100.0)
Platelets: 307 10*3/uL (ref 150–400)
RBC: 4.78 MIL/uL (ref 3.87–5.11)
RDW: 14.1 % (ref 11.5–15.5)
WBC: 9.2 10*3/uL (ref 4.0–10.5)

## 2011-03-01 LAB — LIPASE, BLOOD: Lipase: 23 U/L (ref 11–59)

## 2011-03-01 LAB — URINE MICROSCOPIC-ADD ON

## 2011-03-01 LAB — URINALYSIS, ROUTINE W REFLEX MICROSCOPIC
Bilirubin Urine: NEGATIVE
Glucose, UA: NEGATIVE mg/dL
Ketones, ur: NEGATIVE mg/dL
Leukocytes, UA: NEGATIVE
Nitrite: NEGATIVE
Protein, ur: NEGATIVE mg/dL
Specific Gravity, Urine: 1.018 (ref 1.005–1.030)
Urobilinogen, UA: 0.2 mg/dL (ref 0.0–1.0)
pH: 7.5 (ref 5.0–8.0)

## 2011-03-27 LAB — POCT RAPID STREP A (OFFICE): Streptococcus, Group A Screen (Direct): NEGATIVE

## 2011-03-28 ENCOUNTER — Inpatient Hospital Stay (INDEPENDENT_AMBULATORY_CARE_PROVIDER_SITE_OTHER)
Admission: RE | Admit: 2011-03-28 | Discharge: 2011-03-28 | Disposition: A | Discharge status: Home / Self Care | Payer: Medicaid Other | Source: Ambulatory Visit | Attending: Family Medicine | Admitting: Family Medicine

## 2011-03-28 DIAGNOSIS — J45909 Unspecified asthma, uncomplicated: Secondary | ICD-10-CM

## 2011-03-28 LAB — POCT RAPID STREP A (OFFICE): Streptococcus, Group A Screen (Direct): NEGATIVE

## 2011-05-02 NOTE — Group Therapy Note (Signed)
NAMEFRADY, TADDEO           ACCOUNT NO.:  [id number]   MEDICAL RECORD NO.:  [id number]          PATIENT TYPE:  WOC   LOCATION:  [facility]                   FACILITY:  [facility]   PHYSICIAN:  Elsie Lincoln, MD      DATE OF BIRTH:  02/17/1961   DATE OF SERVICE:  06/26/2007                                  CLINIC NOTE   The patient is a 50 year old female with a large 24 weeks' size  myomatous uterus who received 1 dose of Lupron in January and apparently  underwent some chest pain and got a workup from [facility] that said  that she could undergo surgery; however, I do have the [facility]  records, and I could not decipher anything that looks like a stress test  or anything like that.  The patient says she only had an EKG.  The  patient needs a mammogram, endometrial biopsy, and a Pap smear done.  She is on her period today and would like to wait until next week to do  the just the endometrial biopsy today, but she would rather just not be  examined while she is on her menses.  I do believe that the Lupron shot  has worn off at this point given that her uterus is still very large.  Other things that need to be done prior to the hysterectomy is a  conversation with the [facility] doctor who sees her.  I cannot  decipher if she has had an adequate workup prior to major surgery, so we  will look into that.  The patient to come back in 1 week.           ______________________________  Elsie Lincoln, MD     KL/MEDQ  D:  06/26/2007  T:  06/27/2007  Job:  [id number]

## 2011-05-02 NOTE — Group Therapy Note (Signed)
[name], [name]           ACCOUNT NO.:  [id number]   MEDICAL RECORD NO.:  [id number]          PATIENT TYPE:  WOC   LOCATION:  [facility]                   FACILITY:  [facility]   PHYSICIAN:  Deirdre Poe, CNM       DATE OF BIRTH:  06-Jul-1961   DATE OF SERVICE:  08/05/2008                                  CLINIC NOTE   REASON FOR VISIT:  Follow-up of left ovarian cyst.   HISTORY:  This is a 50 year old G3, P 1-0-2-1, who was followed by [place] is  2007, for her myomatous uterus and in 2008, had a TAH by Dr. Silas Flood.  She  is here today due to discovery of an incidental finding of a left  ovarian cyst by CT scan.  She was an inpatient at [facility]  about a week ago in Dr. Benjaman Lobe service due to abdominal pain with  intractable vomiting.  She was felt to have probable gastritis and  gastric ulcer.  She was also noted to have iron deficiency anemia, has  hypothyroidism and is morbidly obese.  Her CT scan done on August 24, 2008, was remarkable for some stable left renal cysts, probable stone in  the upper pole of the left kidney, which was nonobstructing, but she is  status post cholecystectomy and hysterectomy.  She had a 4.0 x 2.9 x 3.5  cystic lesion, thought to be arising from the left ovary with a small  amount of adjacent free fluid in the left adnexa and in the cul-de-sac.  Their recommendation was to do a follow-up pelvic ultrasound in 6 weeks.  Since her hospital discharge, she is no longer having any nausea,  vomiting or abdominal pain.   PHYSICAL EXAMINATION:  GENERAL:  Obese AA female in NAD.  HEENT:  Poor dentition.  Abbreviated exam is done.  Please see complete  H&P done a week ago at [facility].  VITAL SIGNS:  Of note, today her BP is 131/86 and weight 182.5.  ABDOMEN:  Soft, obese, nontender.  No masses palpated.  Bimanual pelvic  exam is done, however, due to body habitus unable to clearly outline the  uterus or ovaries not felt.  She did have no  tenderness on exam or on  cervical motion.   ASSESSMENT:  Probable left adnexal cyst, question type.   PLAN:  In discussion with Dr. Penne Lash, the patient was sent to have a  pelvic ultrasound to see if this is indeed an ovarian cyst and whether  or not has resolved.  She will get that done in about a week and then  will return to [place] in about 2 weeks.           ______________________________  Caren Griffins, CNM     DP/MEDQ  D:  08/05/2008  T:  08/05/2008  Job:  [id number]

## 2011-05-02 NOTE — Op Note (Signed)
[name], [name]           ACCOUNT NO.:  [id number]   MEDICAL RECORD NO.:  [id number]           PATIENT TYPE:   LOCATION:                                [facility]   PHYSICIAN:  Ginger Carne, MD  DATE OF BIRTH:  June 24, 1961   DATE OF PROCEDURE:  09/16/2007  DATE OF DISCHARGE:                               OPERATIVE REPORT   PREOPERATIVE DIAGNOSIS:  A 22-week leiomyomatous uterus.   POSTOPERATIVE DIAGNOSIS:  A 22-week leiomyomatous uterus.   PROCEDURE:  Total abdominal hysterectomy with preservation of both tubes  and ovaries.   SURGEON:  Ginger Carne, MD.   ASSISTANT:  None.   COMPLICATIONS:  None immediate.   ESTIMATED BLOOD LOSS:  275 mL.   SPECIMEN:  Uterus and cervix to pathology.   ANESTHESIA:  General.   OPERATIVE FINDINGS:  A 22-week size leiomyomatous uterus was observed.  The tubes and ovaries were normal.  Large and small bowel grossly  normal.  Ureters identified visually in their pelvic course.   OPERATIVE PROCEDURE:  The patient prepped and draped in usual fashion  and placed in the supine position.  Betadine solution used for  antiseptic and the patient was catheterized prior to the procedure.   Afterwards, a vertical infraumbilical incision was made and the abdomen  opened.  The round ligaments were clamped, cut and ligated with 0 Vicryl  suture.  The utero-ovarian ligaments were doubly clamped, cut and  ligated with transfixed 0 Vicryl sutures.  Ureters identified on either  side visually and the anterior and posterior peritoneal reflections were  dissected off the uterus proper.  Using a standard Richardson fashion,  the uterine vasculature was clamped, cut and ligated with 0 Vicryl  suture.  At the junction of the cervix and the vagina, the corpus and  cervix were then removed.  The angles of the vagina were sutured with  separate and interrupted 0 Vicryl suture and the remainder closed with  running 0 Vicryl suture.  Copious  irrigation utilized, no active  bleeding noted anywhere in the pelvis including the IP ligaments.  Bleeding points hemostatically checked.  Blood clots removed.  Closure  of the  fascia in one layer with double looped 0 PDS running suture and 2-0  plain for the subcutaneous layer and skin staples for the skin edges.  Instrument and sponge count were correct.  The patient tolerated the  procedure well and returned to the post anesthesia recovery room in  excellent condition.      Ginger Carne, MD  Electronically Signed     SHB/MEDQ  D:  09/16/2007  T:  09/16/2007  Job:  [id number]

## 2011-05-02 NOTE — H&P (Signed)
[name], BONIFAS           ACCOUNT NO.:  [id number]   MEDICAL RECORD NO.:  [id number]           PATIENT TYPE:  INP   LOCATION:                                [facility]   PHYSICIAN:  Ginger Carne, MD       DATE OF BIRTH:   DATE OF ADMISSION:  09/16/2007  DATE OF DISCHARGE:                              HISTORY & PHYSICAL   REASON FOR HOSPITALIZATION:  A 24-week leiomyomatous uterus with  menorrhagia.   PRESENT HISTORY:  This patient is a 50 year old morbidly obese African-  American female, gravida 3, para 1-0-2-1 who was initially seen in the  early spring of 2008.  At that time she was having abdominal pain and  bleeding. Physical examination and ultrasound confirmed a 24-week  leiomyomatous uterus.  She was given one course of Depo Lupron for  control of bleeding.  An endometrial biopsy obtained in August 2008  revealed benign secretory endometrium without neoplasia or hyperplasia.  Pap smear was also normal.  The patient wishes to have definitive  management of said mass.  It has caused discomfort in her lower back as  well as lower abdomen.   PAST MEDICAL HISTORY:  1. The patient is morbidly obese  2. History of hypothyroidism.  3. She described history of chest pain which she was evaluated by the      cardiology, Dr. Reyes Ivan.  She had undergone a nuclear stress study      which was normal, normal perfusion, normal LV systolic function,      ejection fraction of 71% with clearance for her surgery.  This      testing was performed in July 2008.   PAST OB/GYN HISTORY:  The patient has had one cesarean section, two  first trimester incomplete abortions, and a postpartum sterilization.   PAST SURGICAL HISTORY:  1. Cholecystectomy in 2000.  2. Dental extraction.  3. Bilateral breast biopsies.   ALLERGIES:  No known allergies to drugs, latex or iodine.   MEDICATIONS:  Synthroid 150 mcg daily.   REVIEW OF SYSTEMS:  A 14-point comprehensive Review of Systems  within  normal limits.   FAMILY HISTORY:  Her mother, sister and brother have type 2 diabetes.  Her mother and grandmother had myocardial infarctions. Mother and  grandmother have had hypertension, and a sister has had a  thromboembolism.   SOCIAL HISTORY:  Patient is a nonsmoker.  Denies alcohol or illicit drug  abuse.  She is unemployed.   PHYSICAL EXAMINATION:  GENERAL:  Pleasant female in no acute distress.  VITAL SIGNS:  Weight 184 pounds, height 4 feet 9 inches, blood pressure  125/83, pulse 101 and regular, temperature 98.1.  HEENT:  Grossly normal.  BREASTS:  Exam without masses, discharge, thickenings or tenderness.  CHEST:  Clear to percussion and auscultation.  CARDIOVASCULAR:  Exam without murmurs or enlargements.  Regular rate and  rhythm.  EXTREMITIES, LYMPHATIC, SKIN, NEUROLOGICAL, MUSCULOSKELETAL:  Systems  all within normal limits.  ABDOMEN:  Reveals a 24-week size mass consistent with leiomyomatous  uterus.  No evidence of hepatosplenomegaly.  PELVIC:  External genitalia, vulva  and vagina normal.  Cervix smooth  without erosions or lesions.  Uterus palpates to approximately 22-24-  week size.  Both adnexa are difficult to appreciate.   IMPRESSION:  Symptomatic leiomyomatous uterus.   PLAN:  The patient will undergo total abdominal hysterectomy with  preservation of both tubes and ovaries.  The patient understands that,  due to the nature of her fibroid, it is sometimes difficult to preserve  both ovaries, and one or both may be ultimately sacrificed if adhesions  warrant difficult preservation.  Risks including possible injuries to  ureter, bowel, bladder, hemorrhage requiring blood transfusion,  infection, postoperative infection, wound dehiscence or pulmonary  complications were also discussed and understood by said patient.  She  is, therefore, scheduled for the September 16, 2007, for a total  abdominal hysterectomy.  The patient verbalized understanding of  same.      Ginger Carne, MD  Electronically Signed     SHB/MEDQ  D:  09/12/2007  T:  09/12/2007  Job:  [id number]

## 2011-05-02 NOTE — Discharge Summary (Signed)
[name], [name]           ACCOUNT NO.:  [id number]   MEDICAL RECORD NO.:  [id number]          PATIENT TYPE:  INP   LOCATION:  9318                          [facility]   PHYSICIAN:  Ginger Carne, MD  DATE OF BIRTH:  03/09/1961   DATE OF ADMISSION:  09/16/2007  DATE OF DISCHARGE:  09/18/2007                               DISCHARGE SUMMARY   REASON FOR ADMISSION:  22-week leiomyomatous uterus.   PROCEDURE:  Total abdominal hysterectomy with preservation of both tubes  and ovaries.   DISCHARGE DIAGNOSIS:  22-week leiomyomatous uterus.   HOSPITAL COURSE:  This is a 50 year old African-American female who  underwent the aforementioned procedure on September 16, 2007.  The  patient's intraoperative course was uneventful.  She had a postoperative  unremarkable course.  She was afebrile, creatinine 0.62, H&H 26.4 and  8.8, from a preoperative of 33.9 and 10.9.  Calves without tenderness,  lungs clear, abdomen soft, incision dry, and scant vaginal flow.  She  had passed flatus prior to discharge.   The patient was discharged with routine postoperative instructions  including contacting the office for fever, chills, temperature above  100.4 degrees Fahrenheit, increasing abdominal pain, incisional drainage  or erythema, vaginal bleeding, GI or GU complaints.  The patient was  prescribed Vicodin 1-2 every 4-6 hours as needed for pain and she will  continue her home medications including Levothyroxine 150 mcg daily.  She was scheduled to return to the GYN Clinic at [facility] on  October 02, 2007, for stable removal.  All questions answered to the  satisfactory of said patient and the patient verbalized understanding of  the same.  Further instructions were included in the discharge paper  work.      Ginger Carne, MD  Electronically Signed     SHB/MEDQ  D:  09/18/2007  T:  09/18/2007  Job:  [id number]

## 2011-05-02 NOTE — H&P (Signed)
[name], [name]           ACCOUNT NO.:  [id number]   MEDICAL RECORD NO.:  [id number]          PATIENT TYPE:  INP   LOCATION:  5506                         FACILITY:  [facility]   PHYSICIAN:  Lonia Blood, M.D.       DATE OF BIRTH:  [birth year]   DATE OF ADMISSION:  07/24/2008  DATE OF DISCHARGE:                              HISTORY & PHYSICAL   PRIMARY CARE PHYSICIAN:  Dineen Kid. Reche Dixon, MD with [facility].   CHIEF COMPLAINT:  Abdominal pain.   HISTORY OF PRESENT ILLNESS:  Mrs. Louth is a 50 year old woman with  history of peptic ulcer disease who presented today to the emergency  room complaining of about 24 hours onset of persistent severe epigastric  pain associated with intractable nausea and vomiting.  The patient  reports first that her vomitus was mainly food, but now she says that at  times is blood tinged.  She has been taking some Advil for some back  pains recently.   PAST MEDICAL HISTORY:  1. Hypothyroidism.  2. Hysterectomy.  3. Cholecystectomy.  4. Obesity.  5. C-section.   HOME MEDICATIONS:  Synthroid and Advil.   SOCIAL HISTORY:  The patient is on disability, she does not know why.  She does not smoke cigarettes and does not drink alcohol.  Lives with  her daughter.   Family history is positive for diabetes mellitus and coronary artery  disease.   REVIEW OF SYSTEMS:  Negative for chest pain.  Negative for shortness of  breath.  Negative for focal weakness or numbness.  Negative for  headaches.  All other systems reviewed and negative or per HPI.   PHYSICAL EXAMINATION:  VITAL SIGNS:  Upon admission indicates a  temperature of 97.1, blood pressure 128/86, pulse 110, respirations 22,  and saturation 97% on room air.  GENERAL APPEARANCE:  She is one of morbidly obese African American woman  in some moderate distress due to pain, sitting on the stretcher, alert  and oriented to place, person, and time.  HEAD:  Normocephalic and  atraumatic.  EYES:  Pupils equal, round, and reactive to light and accommodation.  Extraocular movements intact.  THROAT:  Clear.  NECK:  Supple.  No JVD.  CHEST:  Clear without wheezes, rhonchi, or crackles.  HEART:  Regular rate and rhythm without murmurs, rubs, or gallop.  ABDOMEN:  The patient's abdomen is soft.  The bowel sounds are present  with some tenderness in the epigastrium, but no rebound and no guarding.  No palpable hepatosplenomegaly.  LOWER EXTREMITY:  Without edema.  SKIN:  Warm and dry without any suspicious looking rashes.   Laboratory values at the time of admission indicate a sodium 139,  potassium 3.9, chloride 107, BUN 11, creatinine 0.9, calcium 1.1, white  blood cell count 14.6, hemoglobin 13.5, platelet count 319, and MCV 76.  Urinalysis positive for some minimal leukocyte esterase.  Liver function  testing indicates an alkaline phosphatase of 28, protein 8.8 albumin  4.1.  AST, ALT, and bilirubin within normal limits.  Lipase is 17.  CT  scan of abdomen and pelvis indicates a complex  4-cm left adnexal cyst,  but no other acute findings.   ASSESSMENT AND PLAN:  Acute abdominal pain with intractable nausea and  vomiting.  The differential diagnosis includes peptic ulcer disease,  gastritis versus possible early appendicitis.  The CT scan could not  visualize the appendix that well.  Ms. Altamirano will be admitted to [facility].  She will be placed on intravenous fluids, intravenous  proton pump inhibitors, and symptomatic treatment.  Her leukocytosis  will be monitored, and she will have serial abdominal exams.  As the  patient is afebrile and without peritoneal signs, I have not put her on  an antibiotic yet.      Lonia Blood, M.D.  Electronically Signed     SL/MEDQ  D:  07/24/2008  T:  07/25/2008  Job:  [id number]   cc:   Dala Dock

## 2011-05-02 NOTE — Discharge Summary (Signed)
NAMESHARAN, MCENANEY           ACCOUNT NO.:  [id number]   MEDICAL RECORD NO.:  [id number]          PATIENT TYPE:  INP   LOCATION:  5506                         FACILITY:  [facility]   PHYSICIAN:  Lonia Blood, M.D.       DATE OF BIRTH:  [birth year]   DATE OF ADMISSION:  07/24/2008  DATE OF DISCHARGE:  07/26/2008                               DISCHARGE SUMMARY   PRIMARY CARE PHYSICIAN:  Dineen Kid. Reche Dixon, MD   DISCHARGE DIAGNOSES:  1. Abdominal pain - resolved.  2. Probable gastritis/gastric ulcer, nonsteroidal anti-inflammatory      drug induced.  3. Iron-deficiency anemia.  4. Hypothyroidism.  5. Status post hysterectomy.  6. Status post cholecystectomy.  7. Morbid obesity.  8. Complex cyst in the left adnexa, needs Gynecology followup to be      arranged through [facility]  9. Nausea and vomiting, resolved.   DISCHARGE MEDICATIONS:  Omeprazole 40 mg daily and Synthroid 150 mcg  daily.   CONDITION ON DISCHARGE:  [name] is discharged in good condition.  She is able to tolerate the regular diet without any symptoms.  The  patient is told to follow up with [facility] on Monday,  July 27, 2008.  The patient will need her omeprazole refilled and she  will need an appointment with Gynecology scheduled for her adnexal cyst.   PROCEDURE DURING THIS ADMISSION:  The patient had a CT scan of her  abdomen and pelvis, findings of adnexal cyst.   CONSULTATION:  No consultations obtained.   ADMISSION HISTORY AND PHYSICAL:  Refer to dictated H&P done by Dr. Lavera Guise  on July 24, 2008.   HOSPITAL COURSE:  1. Abdominal pain, nausea, and vomiting.  [name] was admitted via      the emergency room with complaints of epigastric pain, nausea, and      vomiting.  She endorsed the use of nonsteroidal anti-inflammatory      drugs for headaches.  The patient was placed on proton pump      inhibitor intravenously and clear liquid diet, and she slowly did  improve.  By hospital day #2, she was able to tolerate a regular      diet and her PPI was switched to oral form.  She remained without      any recurrent symptoms and today, July 26, 2008, she is discharged      with      instructions to follow up at [facility].  2. Incidental finding of a left adnexal cyst.  This will need close      attention and follow up most likely with a pelvic ultrasound      through the Gynecology Clinic.  [facility] primary care physician      to arrange.      Lonia Blood, M.D.  Electronically Signed     SL/MEDQ  D:  07/26/2008  T:  07/26/2008  Job:  [id number]

## 2011-05-05 NOTE — Group Therapy Note (Signed)
[name], [name] NO.:  [id number]   MEDICAL RECORD NO.:  [id number]          PATIENT TYPE:  WOC   LOCATION:  [facility]                   FACILITY:  [facility]   PHYSICIAN:  Argentina Donovan, MD        DATE OF BIRTH:  10/01/1961   DATE OF SERVICE:                                  CLINIC NOTE   The patient is a 50 year old African American female gravida 3, para 1-0-  2-1, with a child 30 years old who was seen at [facility] and has  always had normal Pap smears and came in complaining of right sided  pain.  They got an ultrasound and palpated the abdomen.  She has  enlarged uterine fibroids that measure almost 20 cm in diameter and  extend up to the xiphoid process in this 4 feet 9 inches 178 pound lady.  She takes Synthroid but no other medication. She has no known allergies.  She has never undergone any surgery and she is in general good health.   My plan is to give her a shot of Depo-Medrol 11.25, follow her up in two  months. At that time, go ahead and schedule her for abdominal  hysterectomy once the uterus has had a chance to shrink down somewhat.   DIAGNOSIS:  Symptomatic fibroids.  She has no other complaints of  gastrointestinal symptoms or urinary tract symptoms.           ______________________________  Argentina Donovan, MD     PR/MEDQ  D:  11/29/2006  T:  11/29/2006  Job:  [id number]

## 2011-05-05 NOTE — Assessment & Plan Note (Signed)
NAMEDESTRY, BEZDEK           ACCOUNT NO.:  [id number]   MEDICAL RECORD NO.:  [id number]          PATIENT TYPE:  WOC   LOCATION:  [facility]         FACILITY:  [facility]   PHYSICIAN:  Allie Bossier, MD        DATE OF BIRTH:  1961-09-30   DATE OF SERVICE:                                  CLINIC NOTE   [name] is a 50 year old singe, black, gravida 3, para 1, abortus 2 who  was seen here for evaluation of abdominal pain and bleeding.  At that  time an ultrasound and physical exam showed a 20-cm fibroid uterus.  She  was given an 11.25 mg of Lupron on December 03, 2006.  She has several  weeks of bleeding but since then her bleeding has stopped.  Her pain,  however, has increased.  She does wish to have the uterus removed and  possibly the ovaries and tubes as well.   PAST MEDICAL HISTORY:  1. Morbid obesity.  2. Hypothyroidism.  3. She describes chest pain for the last 2 years and has seen a      physician at [facility] on [address].  It does not sound      as if she has had a workup yet for this problem.   PAST SURGICAL HISTORY:  1. Cholecystectomy in 2000__________.  2. Dental extraction.  3. Caesarian section.  4. Post __________ sterilization.  5. Dilation and curettage x2 after miscarriages.  6. Bilateral breast biopsies.   REVIEW OF SYSTEMS:  She is unemployed.  She denies dyspareunia.  She has  been monogamous for the last 20 years.  Her daughter currently lives  with her, however, her significant other does not.   MEDICATIONS:  1. She takes 125 mcg of Synthroid.  2. Lupron was given December 03, 2006.   ALLERGIES:  No known drug allergies.  No latex allergy.   PHYSICAL EXAMINATION:  HEENT:  Normal.  HEART:  Regular rate and rhythm.  LUNGS:  Clear to auscultation bilaterally.  BREAST:  Normal.  EXTERNAL GENITALIA:  Deferred.  ABDOMEN:  Shows that her uterus has now shrunk to just below her  umbilicus.   ASSESSMENT/PLAN:  Symptomatic uterine  fibroids responding well to  Lupron.  We will plan for a hysterectomy in the near future as soon as a  workup of chest pain is completed.  We have requested records from  [facility] and further cardiology workup may be necessary.      Allie Bossier, MD     MCD/MEDQ  D:  01/31/2007  T:  01/31/2007  Job:  [id number]

## 2011-05-21 ENCOUNTER — Inpatient Hospital Stay (INDEPENDENT_AMBULATORY_CARE_PROVIDER_SITE_OTHER)
Admission: RE | Admit: 2011-05-21 | Discharge: 2011-05-21 | Disposition: A | Discharge status: Home / Self Care | Payer: Medicaid Other | Source: Ambulatory Visit | Attending: Emergency Medicine | Admitting: Emergency Medicine

## 2011-05-21 DIAGNOSIS — J069 Acute upper respiratory infection, unspecified: Secondary | ICD-10-CM

## 2011-07-11 ENCOUNTER — Ambulatory Visit (INDEPENDENT_AMBULATORY_CARE_PROVIDER_SITE_OTHER): Payer: Medicaid Other

## 2011-07-11 ENCOUNTER — Inpatient Hospital Stay (INDEPENDENT_AMBULATORY_CARE_PROVIDER_SITE_OTHER)
Admission: RE | Admit: 2011-07-11 | Discharge: 2011-07-11 | Disposition: A | Discharge status: Home / Self Care | Payer: Medicaid Other | Source: Ambulatory Visit | Attending: Emergency Medicine | Admitting: Emergency Medicine

## 2011-07-11 DIAGNOSIS — M75100 Unspecified rotator cuff tear or rupture of unspecified shoulder, not specified as traumatic: Secondary | ICD-10-CM

## 2011-07-11 DIAGNOSIS — M799 Soft tissue disorder, unspecified: Secondary | ICD-10-CM

## 2011-09-15 LAB — CBC
HCT: 32.2 — ABNORMAL LOW
HCT: 33.5 — ABNORMAL LOW
HCT: 33.6 — ABNORMAL LOW
HCT: 41.4
Hemoglobin: 10.5 — ABNORMAL LOW
Hemoglobin: 10.7 — ABNORMAL LOW
Hemoglobin: 10.8 — ABNORMAL LOW
Hemoglobin: 13.5
MCHC: 32
MCHC: 32.2
MCHC: 32.5
MCHC: 32.7
MCV: 76.7 — ABNORMAL LOW
MCV: 77.1 — ABNORMAL LOW
MCV: 77.2 — ABNORMAL LOW
MCV: 77.4 — ABNORMAL LOW
Platelets: 254
Platelets: 264
Platelets: 270
Platelets: 319
RBC: 4.18
RBC: 4.34
RBC: 4.34
RBC: 5.39 — ABNORMAL HIGH
RDW: 14.5
RDW: 14.6
RDW: 14.9
RDW: 15
WBC: 14.6 — ABNORMAL HIGH
WBC: 6
WBC: 8.1
WBC: 8.9

## 2011-09-15 LAB — DIFFERENTIAL
Basophils Absolute: 0
Basophils Relative: 0
Eosinophils Absolute: 0.1
Eosinophils Relative: 0
Lymphocytes Relative: 17
Lymphs Abs: 2.5
Monocytes Absolute: 0.4
Monocytes Relative: 3
Neutro Abs: 11.6 — ABNORMAL HIGH
Neutrophils Relative %: 80 — ABNORMAL HIGH

## 2011-09-15 LAB — LIPASE, BLOOD: Lipase: 17

## 2011-09-15 LAB — URINE MICROSCOPIC-ADD ON

## 2011-09-15 LAB — POCT I-STAT, CHEM 8
BUN: 11
Calcium, Ion: 1.16
Chloride: 107
Creatinine, Ser: 0.9
Glucose, Bld: 121 — ABNORMAL HIGH
HCT: 45
Hemoglobin: 15.3 — ABNORMAL HIGH
Potassium: 3.9
Sodium: 139
TCO2: 24

## 2011-09-15 LAB — BASIC METABOLIC PANEL
BUN: 5 — ABNORMAL LOW
BUN: 7
CO2: 22
CO2: 22
Calcium: 8.3 — ABNORMAL LOW
Calcium: 8.4
Chloride: 111
Chloride: 111
Creatinine, Ser: 0.6
Creatinine, Ser: 0.74
GFR calc Af Amer: >60
GFR calc Af Amer: >60
GFR calc non Af Amer: >60
GFR calc non Af Amer: >60
Glucose, Bld: 86
Glucose, Bld: 89
Potassium: 3.8
Potassium: 3.9
Sodium: 138
Sodium: 139

## 2011-09-15 LAB — URINALYSIS, ROUTINE W REFLEX MICROSCOPIC
Bilirubin Urine: NEGATIVE
Glucose, UA: NEGATIVE
Hgb urine dipstick: NEGATIVE
Ketones, ur: 15 — AB
Nitrite: NEGATIVE
Protein, ur: NEGATIVE
Specific Gravity, Urine: 1.021
Urobilinogen, UA: 0.2
pH: 8.5 — ABNORMAL HIGH

## 2011-09-15 LAB — HEPATIC FUNCTION PANEL
ALT: 20
AST: 24
Albumin: 4.1
Alkaline Phosphatase: 28 — ABNORMAL LOW
Bilirubin, Direct: 0.2
Indirect Bilirubin: 0.7
Total Bilirubin: 0.9
Total Protein: 8.8 — ABNORMAL HIGH

## 2011-09-15 LAB — FERRITIN: Ferritin: 133 (ref 10–291)

## 2011-09-15 LAB — URINE CULTURE: Colony Count: 8000

## 2011-09-28 LAB — COMPREHENSIVE METABOLIC PANEL
ALT: 17
AST: 19
Albumin: 3.2 — ABNORMAL LOW
Alkaline Phosphatase: 22 — ABNORMAL LOW
BUN: 4 — ABNORMAL LOW
CO2: 26
Calcium: 9.1
Chloride: 104
Creatinine, Ser: 0.62
GFR calc Af Amer: >60
GFR calc non Af Amer: >60
Glucose, Bld: 104 — ABNORMAL HIGH
Potassium: 3.5
Sodium: 136
Total Bilirubin: 0.7
Total Protein: 6.8

## 2011-09-28 LAB — CBC
HCT: 29 — ABNORMAL LOW
HCT: 26.4 — ABNORMAL LOW
HCT: 33.9 — ABNORMAL LOW
Hemoglobin: 9.6 — ABNORMAL LOW
Hemoglobin: 10.9 — ABNORMAL LOW
Hemoglobin: 8.8 — ABNORMAL LOW
MCHC: 33
MCHC: 32.2
MCHC: 33.2
MCV: 74.6 — ABNORMAL LOW
MCV: 75 — ABNORMAL LOW
MCV: 75.7 — ABNORMAL LOW
Platelets: 367
Platelets: 318
Platelets: 354
RBC: 3.89
RBC: 3.52 — ABNORMAL LOW
RBC: 4.48
RDW: 14.2 — ABNORMAL HIGH
RDW: 14.3 — ABNORMAL HIGH
RDW: 14.5 — ABNORMAL HIGH
WBC: 12.2 — ABNORMAL HIGH
WBC: 13.2 — ABNORMAL HIGH
WBC: 9.5

## 2011-09-28 LAB — BASIC METABOLIC PANEL
BUN: 3 — ABNORMAL LOW
CO2: 26
Calcium: 9.1
Chloride: 107
Creatinine, Ser: 0.62
GFR calc Af Amer: >60
GFR calc non Af Amer: >60
Glucose, Bld: 114 — ABNORMAL HIGH
Potassium: 4.1
Sodium: 138

## 2011-09-28 LAB — HCG, SERUM, QUALITATIVE: Preg, Serum: NEGATIVE

## 2011-11-07 ENCOUNTER — Emergency Department (HOSPITAL_COMMUNITY)
Admission: EM | Admit: 2011-11-07 | Discharge: 2011-11-08 | Disposition: A | Discharge status: Home / Self Care | Payer: Medicaid Other | Attending: Emergency Medicine | Admitting: Emergency Medicine

## 2011-11-07 ENCOUNTER — Emergency Department (INDEPENDENT_AMBULATORY_CARE_PROVIDER_SITE_OTHER)
Admission: EM | Admit: 2011-11-07 | Discharge: 2011-11-07 | Disposition: A | Discharge status: Home / Self Care | Payer: Medicaid Other | Source: Home / Self Care | Attending: Family Medicine | Admitting: Family Medicine

## 2011-11-07 ENCOUNTER — Encounter: Payer: Self-pay | Admitting: Emergency Medicine

## 2011-11-07 ENCOUNTER — Encounter: Payer: Self-pay | Admitting: *Deleted

## 2011-11-07 ENCOUNTER — Emergency Department (INDEPENDENT_AMBULATORY_CARE_PROVIDER_SITE_OTHER): Payer: Medicaid Other

## 2011-11-07 ENCOUNTER — Other Ambulatory Visit: Payer: Self-pay

## 2011-11-07 ENCOUNTER — Emergency Department (HOSPITAL_COMMUNITY): Payer: Medicaid Other

## 2011-11-07 DIAGNOSIS — R509 Fever, unspecified: Secondary | ICD-10-CM | POA: Insufficient documentation

## 2011-11-07 DIAGNOSIS — J45909 Unspecified asthma, uncomplicated: Secondary | ICD-10-CM | POA: Insufficient documentation

## 2011-11-07 DIAGNOSIS — M79609 Pain in unspecified limb: Secondary | ICD-10-CM | POA: Insufficient documentation

## 2011-11-07 DIAGNOSIS — R059 Cough, unspecified: Secondary | ICD-10-CM | POA: Insufficient documentation

## 2011-11-07 DIAGNOSIS — J189 Pneumonia, unspecified organism: Secondary | ICD-10-CM

## 2011-11-07 DIAGNOSIS — J45901 Unspecified asthma with (acute) exacerbation: Secondary | ICD-10-CM

## 2011-11-07 DIAGNOSIS — R0602 Shortness of breath: Secondary | ICD-10-CM | POA: Insufficient documentation

## 2011-11-07 DIAGNOSIS — R110 Nausea: Secondary | ICD-10-CM | POA: Insufficient documentation

## 2011-11-07 DIAGNOSIS — R5381 Other malaise: Secondary | ICD-10-CM | POA: Insufficient documentation

## 2011-11-07 DIAGNOSIS — Z79899 Other long term (current) drug therapy: Secondary | ICD-10-CM | POA: Insufficient documentation

## 2011-11-07 DIAGNOSIS — R079 Chest pain, unspecified: Secondary | ICD-10-CM | POA: Insufficient documentation

## 2011-11-07 DIAGNOSIS — R05 Cough: Secondary | ICD-10-CM | POA: Insufficient documentation

## 2011-11-07 DIAGNOSIS — E785 Hyperlipidemia, unspecified: Secondary | ICD-10-CM

## 2011-11-07 HISTORY — DX: Hyperlipidemia, unspecified: E78.5

## 2011-11-07 HISTORY — DX: Disorder of thyroid, unspecified: E07.9

## 2011-11-07 HISTORY — DX: Gastro-esophageal reflux disease without esophagitis: K21.9

## 2011-11-07 LAB — URINALYSIS, ROUTINE W REFLEX MICROSCOPIC
Bilirubin Urine: NEGATIVE
Glucose, UA: NEGATIVE mg/dL
Hgb urine dipstick: NEGATIVE
Ketones, ur: NEGATIVE mg/dL
Nitrite: NEGATIVE
Protein, ur: NEGATIVE mg/dL
Specific Gravity, Urine: 1.023 (ref 1.005–1.030)
Urobilinogen, UA: 1 mg/dL (ref 0.0–1.0)
pH: 8.5 — ABNORMAL HIGH (ref 5.0–8.0)

## 2011-11-07 LAB — CBC
HCT: 36.9 % (ref 36.0–46.0)
Hemoglobin: 12.2 g/dL (ref 12.0–15.0)
MCH: 24.8 pg — ABNORMAL LOW (ref 26.0–34.0)
MCHC: 33.1 g/dL (ref 30.0–36.0)
MCV: 75.2 fL — ABNORMAL LOW (ref 78.0–100.0)
Platelets: 302 10*3/uL (ref 150–400)
RBC: 4.91 MIL/uL (ref 3.87–5.11)
RDW: 13.7 % (ref 11.5–15.5)
WBC: 11.6 10*3/uL — ABNORMAL HIGH (ref 4.0–10.5)

## 2011-11-07 LAB — BASIC METABOLIC PANEL
BUN: 11 mg/dL (ref 6–23)
CO2: 24 mEq/L (ref 19–32)
Calcium: 10.2 mg/dL (ref 8.4–10.5)
Chloride: 100 mEq/L (ref 96–112)
Creatinine, Ser: 0.84 mg/dL (ref 0.50–1.10)
GFR calc Af Amer: >90 mL/min (ref >90)
GFR calc non Af Amer: 80 mL/min — ABNORMAL LOW (ref >90)
Glucose, Bld: 99 mg/dL (ref 70–99)
Potassium: 3.7 mEq/L (ref 3.5–5.1)
Sodium: 135 mEq/L (ref 135–145)

## 2011-11-07 LAB — URINE MICROSCOPIC-ADD ON

## 2011-11-07 LAB — POCT I-STAT TROPONIN I: Troponin i, poc: 0 ng/mL (ref 0.00–0.08)

## 2011-11-07 LAB — HEPATIC FUNCTION PANEL
ALT: 13 U/L (ref 0–35)
AST: 19 U/L (ref 0–37)
Albumin: 4 g/dL (ref 3.5–5.2)
Alkaline Phosphatase: 30 U/L — ABNORMAL LOW (ref 39–117)
Bilirubin, Direct: <0.1 mg/dL (ref 0.0–0.3)
Total Bilirubin: 0.3 mg/dL (ref 0.3–1.2)
Total Protein: 9 g/dL — ABNORMAL HIGH (ref 6.0–8.3)

## 2011-11-07 LAB — LIPASE, BLOOD: Lipase: 27 U/L (ref 11–59)

## 2011-11-07 MED ORDER — IPRATROPIUM BROMIDE 0.02 % IN SOLN
0.5000 mg | Freq: Once | RESPIRATORY_TRACT | Status: AC
Start: 2011-11-07 — End: 2011-11-07
  Administered 2011-11-07: 0.5 mg via RESPIRATORY_TRACT

## 2011-11-07 MED ORDER — ONDANSETRON HCL 4 MG/2ML IJ SOLN
4.0000 mg | Freq: Once | INTRAMUSCULAR | Status: AC
  Administered 2011-11-08: 4 mg via INTRAVENOUS
  Filled 2011-11-07: qty 2

## 2011-11-07 MED ORDER — ACETAMINOPHEN 325 MG PO TABS
650.0000 mg | ORAL_TABLET | Freq: Once | ORAL | Status: AC
  Administered 2011-11-08: 650 mg via ORAL
  Filled 2011-11-07: qty 2

## 2011-11-07 MED ORDER — ALBUTEROL SULFATE (5 MG/ML) 0.5% IN NEBU
5.0000 mg | INHALATION_SOLUTION | Freq: Once | RESPIRATORY_TRACT | Status: AC
Start: 2011-11-07 — End: 2011-11-07
  Administered 2011-11-07: 5 mg via RESPIRATORY_TRACT

## 2011-11-07 MED ORDER — MORPHINE SULFATE 4 MG/ML IJ SOLN
INTRAMUSCULAR | Status: AC
  Filled 2011-11-07: qty 1

## 2011-11-07 MED ORDER — ONDANSETRON HCL 4 MG/2ML IJ SOLN: 4.0000 mg | Freq: Once | INTRAMUSCULAR | Status: DC

## 2011-11-07 MED ORDER — METHYLPREDNISOLONE ACETATE 80 MG/ML IJ SUSP
INTRAMUSCULAR | Status: AC
  Filled 2011-11-07: qty 1

## 2011-11-07 MED ORDER — ONDANSETRON HCL 4 MG/2ML IJ SOLN
INTRAMUSCULAR | Status: AC
  Administered 2011-11-07: 4 mg via INTRAVENOUS
  Filled 2011-11-07: qty 2

## 2011-11-07 MED ORDER — METHYLPREDNISOLONE 4 MG PO KIT: PACK | ORAL | Status: AC

## 2011-11-07 MED ORDER — METHYLPREDNISOLONE ACETATE 40 MG/ML IJ SUSP
80.0000 mg | Freq: Once | INTRAMUSCULAR | Status: AC
  Administered 2011-11-07: 80 mg via INTRAMUSCULAR

## 2011-11-07 MED ORDER — ALBUTEROL SULFATE (5 MG/ML) 0.5% IN NEBU
INHALATION_SOLUTION | RESPIRATORY_TRACT | Status: AC
  Filled 2011-11-07: qty 1

## 2011-11-07 MED ORDER — MORPHINE SULFATE 4 MG/ML IJ SOLN
4.0000 mg | Freq: Once | INTRAMUSCULAR | Status: AC
  Administered 2011-11-07: 4 mg via INTRAVENOUS

## 2011-11-07 NOTE — ED Notes (Signed)
Morphine and Zofran given from verbal order then PA entered it into Epic

## 2011-11-07 NOTE — ED Provider Notes (Signed)
History     CSN: [id number] Arrival date & time: 11/07/2011  7:57 PM   First MD Initiated Contact with Patient 11/07/11 2124      Chief Complaint  Patient presents with  . Chest Pain    (Consider location/radiation/quality/duration/timing/severity/associated sxs/prior treatment) HPI Comments: Patient is poor historian -- h/o chest pain, abdominal pain, fever onset earlier today. Pain radiates into all extremities. She denies URI symptoms, ear pain, sore throat. She states mild cough. Chest pain is worsened with palpation. She states abdominal pain with nausea, no vomiting. She denies diarrhea or constipation. She denies blood in stool. She denies blood in urine or dysuria. No treatments prior to arrival.   Patient is a 50 y.o. female presenting with chest pain. The history is provided by the patient and the spouse.  Chest Pain The chest pain began 6 - 12 hours ago. Chest pain occurs constantly. The quality of the pain is described as aching. Exacerbated by: palpation. Primary symptoms include a fever, shortness of breath, abdominal pain and nausea. Pertinent negatives for primary symptoms include no cough, no wheezing, no palpitations and no vomiting. She tried nothing for the symptoms.     Past Medical History  Diagnosis Date  . Asthma     History reviewed. No pertinent past surgical history.  No family history on file.  History  Substance Use Topics  . Smoking status: Never Smoker   . Smokeless tobacco: Not on file  . Alcohol Use: No    OB History    Grav Para Term Preterm Abortions TAB SAB Ect Mult Living                  Review of Systems  Constitutional: Positive for fever. Negative for chills.  HENT: Negative for sore throat and rhinorrhea.   Eyes: Negative for discharge.  Respiratory: Positive for shortness of breath. Negative for cough and wheezing.   Cardiovascular: Positive for chest pain. Negative for palpitations and leg swelling.  Gastrointestinal:  Positive for nausea and abdominal pain. Negative for vomiting, diarrhea and constipation.  Genitourinary: Negative for dysuria and hematuria.  Musculoskeletal: Negative for myalgias.  Skin: Negative for rash.  Neurological: Negative for headaches.  Psychiatric/Behavioral: Negative for confusion.    Allergies  Review of patient's allergies indicates no known allergies.  Home Medications   Current Outpatient Rx  Name Route Sig Dispense Refill  . FLUTICASONE PROPIONATE 50 MCG/ACT NA SUSP Nasal Place 2 sprays into the nose daily.      Marland Kitchen LEVOTHYROXINE SODIUM 137 MCG PO TABS Oral Take 137 mcg by mouth daily.      Marland Kitchen OMEPRAZOLE 40 MG PO CPDR Oral Take 40 mg by mouth daily.      Marland Kitchen SIMVASTATIN 20 MG PO TABS Oral Take 20 mg by mouth at bedtime.        BP 126/87  Pulse 117  Temp(Src) 100.5 F (38.1 C) (Oral)  Resp 24  SpO2 96%  Physical Exam  Nursing note and vitals reviewed. Constitutional: She is oriented to person, place, and time. She appears well-developed and well-nourished.  HENT:  Head: Normocephalic and atraumatic.  Eyes: Pupils are equal, round, and reactive to light. Right eye exhibits no discharge. Left eye exhibits no discharge.  Neck: Normal range of motion. Neck supple.  Cardiovascular: Normal rate and regular rhythm.  Exam reveals no gallop and no friction rub.   No murmur heard. Pulmonary/Chest: Effort normal and breath sounds normal. No respiratory distress. She has no wheezes. She exhibits  tenderness.       Tenderness to palp over chest wall  Abdominal: Soft. She exhibits no distension. There is tenderness. There is no rebound and no guarding.       Diffuse abdominal pain, seemingly worse in LUQ and epigastrium. No Murphy's sign.   Musculoskeletal: Normal range of motion. She exhibits tenderness. She exhibits no edema.       Pt diffusely tender over bilateral LE, L > R. No gross swelling. 2+ pedal pulses bilaterally.   Neurological: She is alert and oriented to  person, place, and time.  Skin: Skin is warm and dry. No rash noted.  Psychiatric: She has a normal mood and affect.    ED Course  Procedures (including critical care time)  Labs Reviewed  CBC - Abnormal; Notable for the following:    WBC 11.6 (*)    MCV 75.2 (*)    MCH 24.8 (*)    All other components within normal limits  BASIC METABOLIC PANEL - Abnormal; Notable for the following:    GFR calc non Af Amer 80 (*)    All other components within normal limits  HEPATIC FUNCTION PANEL - Abnormal; Notable for the following:    Total Protein 9.0 (*)    Alkaline Phosphatase 30 (*)    All other components within normal limits  URINALYSIS, ROUTINE W REFLEX MICROSCOPIC - Abnormal; Notable for the following:    Appearance CLOUDY (*)    pH 8.5 (*)    Leukocytes, UA MODERATE (*)    All other components within normal limits  URINE MICROSCOPIC-ADD ON - Abnormal; Notable for the following:    Squamous Epithelial / LPF FEW (*)    All other components within normal limits  POCT I-STAT TROPONIN I  LIPASE, BLOOD  URINE CULTURE   Dg Chest 2 View  11/07/2011  *RADIOLOGY REPORT*  Clinical Data: Left-sided chest pain.  Lethargy.  Generalized weakness.  Cough.  CHEST - 2 VIEW 11/07/2011:  Comparison: None.  Findings: Suboptimal inspiration due to body habitus which accounts for crowded bronchovascular markings at the bases and accentuates the cardiac silhouette.  Taking this into account, cardiac silhouette upper normal in size to borderline enlarged.  Lungs clear.  Pulmonary vascularity normal.  No pleural effusions. Degenerative changes involving the thoracic spine.  IMPRESSION: Suboptimal inspiration.  Borderline heart size.  No acute cardiopulmonary disease.  Original Report Authenticated By: Arnell Sieving, M.D.   Ct Abdomen Pelvis W Contrast  11/08/2011  *RADIOLOGY REPORT*  Clinical Data: Upper abdominal pain.  CT ABDOMEN AND PELVIS WITH CONTRAST  Technique:  Multidetector CT imaging of the  abdomen and pelvis was performed following the standard protocol during bolus administration of intravenous contrast.  Contrast: 80mL OMNIPAQUE IOHEXOL 300 MG/ML IV SOLN  Comparison: None.  Findings: Heart size upper normal limits to mildly enlarged. Bibasilar opacities.  Punctate calcifications within the liver may represent prior granulomas infection.  Status post cholecystectomy.  There is mild intra and extrahepatic biliary ductal dilatation.  No obstructing lesion is identified.  There is prominence of the main pancreatic duct within the head is well, measuring up to 5 mm.  Unremarkable spleen and adrenal glands.  Bilateral renal hypodensities are too small to further characterize.  There are bilateral nonobstructing renal stones.  No hydronephrosis or hydroureter.  Unable to follow the ureteral courses in their entirety however no definite ureteral stones.  No bowel obstruction or CT evidence for colitis.  Normal appendix.  Partially decompressed bladder with a nonspecific  wall thickening. Absent uterus.  No adnexal mass. No lymphadenopathy. Fat containing left para umbilical hernia.  There is scattered atherosclerotic calcification of the aorta and its branches. No aneurysmal dilatation.  The no acute osseous abnormality.  IMPRESSION:  Bibasilar opacities may represent atelectasis, aspiration, or pneumonia.  Intra and extrahepatic biliary ductal dilatation is nonspecific status post cholecystectomy.  However, there is also mild pancreatic ductal prominence within the head of the pancreas. Therefore, ERCP or MRCP follow-up is recommended to exclude an ampullary lesion.  Bilateral nonobstructing renal stones.  No hydronephrosis.  Bladder wall thickening is nonspecific given incomplete distension. Correlate with urinalysis.  Original Report Authenticated By: Waneta Martins, M.D.     1. CAP (community acquired pneumonia)     9:42 PM Pt seen and examined.  Labs, pain medicine ordered.   11:14 PM  Patient was discussed with Rolan Bucco, MD  Pt seen by Dr. Fredderick Phenix. Given diffuse pain and fever, will CT abd/pelvis to eval for source.   1:52 AM Handoff to Dr. Anitra Lauth who will f/u on CT, reexamine pt, and dispo as appropriate.    MDM          Carolee Rota, PA 11/08/11 [phone]

## 2011-11-07 NOTE — ED Notes (Signed)
PT. REPORTS LEFT CHEST PAIN ONSET THIS EVENING WITH , SLIGHT , PRODUCTIVE COUGH , SLIGHT NAUSEA .

## 2011-11-07 NOTE — ED Notes (Signed)
Patient states her chest pain started about an hour ago radiates to the back.  Seen at the Urgent Care earlier today and given Solumedrol  Patient very restless and unable to stay still

## 2011-11-07 NOTE — ED Provider Notes (Addendum)
History     CSN: [id number] Arrival date & time: 11/07/2011 12:26 PM   First MD Initiated Contact with Patient 11/07/11 1153      Chief Complaint  Patient presents with  . Cough    (Consider location/radiation/quality/duration/timing/severity/associated sxs/prior treatment) Patient is a 50 y.o. female presenting with shortness of breath. The history is provided by the patient.  Shortness of Breath  The current episode started 5 to 7 days ago (seen by lmd 2 d ago , given nose spray without relief.). The onset was gradual. The problem has been unchanged. The problem is moderate. Associated symptoms include rhinorrhea, shortness of breath and wheezing. Recently, medical care has been given by the PCP. Services received include medications given.    Past Medical History  Diagnosis Date  . GERD (gastroesophageal reflux disease)   . Thyroid disease   . Asthma   . Hyperlipemia 11/07/2011     Past Surgical History  Procedure Date  . Abdominal hysterectomy     Family History  Problem Relation Age of Onset  . Heart failure Mother   . Diabetes Mother     History  Substance Use Topics  . Smoking status: Never Smoker   . Smokeless tobacco: Not on file  . Alcohol Use: No    OB History    Grav Para Term Preterm Abortions TAB SAB Ect Mult Living                  Review of Systems  HENT: Positive for rhinorrhea.   Respiratory: Positive for shortness of breath and wheezing.     Allergies  Review of patient's allergies indicates no known allergies.  Home Medications   Current Outpatient Rx  Name Route Sig Dispense Refill  . ALBUTEROL SULFATE HFA 108 (90 BASE) MCG/ACT IN AERS Inhalation Inhale 2 puffs into the lungs every 6 (six) hours as needed.      Marland Kitchen LEVOTHYROXINE SODIUM 137 MCG PO TABS Oral Take 137 mcg by mouth daily.      Marland Kitchen OMEPRAZOLE 40 MG PO CPDR Oral Take 40 mg by mouth daily.      Marland Kitchen SIMVASTATIN 20 MG PO TABS Oral Take 20 mg by mouth at bedtime.      Marland Kitchen  FLUTICASONE PROPIONATE 50 MCG/ACT NA SUSP Nasal Place 2 sprays into the nose daily.      Marland Kitchen LEVOTHYROXINE SODIUM 137 MCG PO TABS Oral Take 137 mcg by mouth daily.      Marland Kitchen OMEPRAZOLE 40 MG PO CPDR Oral Take 40 mg by mouth daily.      Marland Kitchen SIMVASTATIN 20 MG PO TABS Oral Take 20 mg by mouth at bedtime.        BP 152/86  Pulse 102  Temp(Src) 98.6 F (37 C) (Oral)  Resp 20  SpO2 100%  Physical Exam  Constitutional: She is oriented to person, place, and time. She appears well-developed and well-nourished.  HENT:  Head: Normocephalic.  Right Ear: External ear normal.  Left Ear: External ear normal.  Mouth/Throat: Oropharynx is clear and moist.  Eyes: Conjunctivae and EOM are normal. Pupils are equal, round, and reactive to light.  Neck: Normal range of motion. Neck supple.  Pulmonary/Chest: She has wheezes.  Neurological: She is alert and oriented to person, place, and time.  Skin: Skin is warm and dry.    ED Course  Procedures (including critical care time)  Labs Reviewed - No data to display No results found.   1. Asthma exacerbation, mild  MDM  X-rays reviewed and report per radiologist.         Barkley Bruns, MD 11/07/11 1512  Barkley Bruns, MD 11/07/11 1512  Linna Hoff, MD 04/12/12 1253

## 2011-11-07 NOTE — ED Provider Notes (Signed)
I examined and evaluated pt.  She presents with pain all over.  Primarily seems to be in upper abdomen, but also tender to chest on palpation, has pain to arms/legs.  Fever today.  No vomiting.  No BM in 2 days.  Pain started today.  Doubt ACS, may be viral, but with pain in abd, will likely check labs, CT abd/pelvis  Rolan Bucco, MD 11/07/11 2309

## 2011-11-07 NOTE — ED Notes (Signed)
Cough  /  Congestion  Asthma  Symptoms        Short of  Breath   Wheezing  Slightly        Cough  Is  Productive        Pt  Is  Awake  Alert  Oriented   Sitting  Upright on  Exam table   Somewhat  Short of b breath  But  Speaking in complete  sentances

## 2011-11-08 ENCOUNTER — Emergency Department (HOSPITAL_COMMUNITY): Payer: Medicaid Other

## 2011-11-08 MED ORDER — IOHEXOL 300 MG/ML  SOLN
80.0000 mL | Freq: Once | INTRAMUSCULAR | Status: AC | PRN
  Administered 2011-11-08: 80 mL via INTRAVENOUS

## 2011-11-08 MED ORDER — MOXIFLOXACIN HCL IN NACL 400 MG/250ML IV SOLN
400.0000 mg | Freq: Once | INTRAVENOUS | Status: AC
  Administered 2011-11-08: 400 mg via INTRAVENOUS
  Filled 2011-11-08: qty 250

## 2011-11-08 MED ORDER — MOXIFLOXACIN HCL 400 MG PO TABS: 400.0000 mg | ORAL_TABLET | Freq: Every day | ORAL | Status: AC

## 2011-11-08 NOTE — ED Provider Notes (Signed)
History     CSN: [id number] Arrival date & time: 11/07/2011  7:57 PM   First MD Initiated Contact with Patient 11/07/11 2124      Chief Complaint  Patient presents with  . Chest Pain    (Consider location/radiation/quality/duration/timing/severity/associated sxs/prior treatment) HPI  Past Medical History  Diagnosis Date  . Asthma   . Hyperlipemia 11/07/2011     History reviewed. No pertinent past surgical history.  History reviewed. No pertinent family history.  History  Substance Use Topics  . Smoking status: Never Smoker   . Smokeless tobacco: Not on file  . Alcohol Use: No    OB History    Grav Para Term Preterm Abortions TAB SAB Ect Mult Living                  Review of Systems  Allergies  Review of patient's allergies indicates no known allergies.  Home Medications   Current Outpatient Rx  Name Route Sig Dispense Refill  . FLUTICASONE PROPIONATE 50 MCG/ACT NA SUSP Nasal Place 2 sprays into the nose daily.      Marland Kitchen LEVOTHYROXINE SODIUM 137 MCG PO TABS Oral Take 137 mcg by mouth daily.      Marland Kitchen OMEPRAZOLE 40 MG PO CPDR Oral Take 40 mg by mouth daily.      Marland Kitchen SIMVASTATIN 20 MG PO TABS Oral Take 20 mg by mouth at bedtime.        BP 123/80  Pulse 85  Temp(Src) 99.8 F (37.7 C) (Oral)  Resp 25  Ht 5\' 2"  (1.575 m)  Wt 185 lb (83.915 kg)  BMI 33.84 kg/m2  SpO2 97%  Physical Exam  ED Course  Procedures (including critical care time)  Labs Reviewed  CBC - Abnormal; Notable for the following:    WBC 11.6 (*)    MCV 75.2 (*)    MCH 24.8 (*)    All other components within normal limits  BASIC METABOLIC PANEL - Abnormal; Notable for the following:    GFR calc non Af Amer 80 (*)    All other components within normal limits  HEPATIC FUNCTION PANEL - Abnormal; Notable for the following:    Total Protein 9.0 (*)    Alkaline Phosphatase 30 (*)    All other components within normal limits  URINALYSIS, ROUTINE W REFLEX MICROSCOPIC - Abnormal; Notable for  the following:    Appearance CLOUDY (*)    pH 8.5 (*)    Leukocytes, UA MODERATE (*)    All other components within normal limits  URINE MICROSCOPIC-ADD ON - Abnormal; Notable for the following:    Squamous Epithelial / LPF FEW (*)    All other components within normal limits  POCT I-STAT TROPONIN I  LIPASE, BLOOD  I-STAT TROPONIN I  URINE CULTURE   Dg Chest 2 View  11/07/2011  *RADIOLOGY REPORT*  Clinical Data: Left-sided chest pain.  Lethargy.  Generalized weakness.  Cough.  CHEST - 2 VIEW 11/07/2011:  Comparison: None.  Findings: Suboptimal inspiration due to body habitus which accounts for crowded bronchovascular markings at the bases and accentuates the cardiac silhouette.  Taking this into account, cardiac silhouette upper normal in size to borderline enlarged.  Lungs clear.  Pulmonary vascularity normal.  No pleural effusions. Degenerative changes involving the thoracic spine.  IMPRESSION: Suboptimal inspiration.  Borderline heart size.  No acute cardiopulmonary disease.  Original Report Authenticated By: Arnell Sieving, M.D.   Ct Abdomen Pelvis W Contrast  11/08/2011  *RADIOLOGY REPORT*  Clinical  Data: Upper abdominal pain.  CT ABDOMEN AND PELVIS WITH CONTRAST  Technique:  Multidetector CT imaging of the abdomen and pelvis was performed following the standard protocol during bolus administration of intravenous contrast.  Contrast: 80mL OMNIPAQUE IOHEXOL 300 MG/ML IV SOLN  Comparison: None.  Findings: Heart size upper normal limits to mildly enlarged. Bibasilar opacities.  Punctate calcifications within the liver may represent prior granulomas infection.  Status post cholecystectomy.  There is mild intra and extrahepatic biliary ductal dilatation.  No obstructing lesion is identified.  There is prominence of the main pancreatic duct within the head is well, measuring up to 5 mm.  Unremarkable spleen and adrenal glands.  Bilateral renal hypodensities are too small to further  characterize.  There are bilateral nonobstructing renal stones.  No hydronephrosis or hydroureter.  Unable to follow the ureteral courses in their entirety however no definite ureteral stones.  No bowel obstruction or CT evidence for colitis.  Normal appendix.  Partially decompressed bladder with a nonspecific wall thickening. Absent uterus.  No adnexal mass. No lymphadenopathy. Fat containing left para umbilical hernia.  There is scattered atherosclerotic calcification of the aorta and its branches. No aneurysmal dilatation.  The no acute osseous abnormality.  IMPRESSION:  Bibasilar opacities may represent atelectasis, aspiration, or pneumonia.  Intra and extrahepatic biliary ductal dilatation is nonspecific status post cholecystectomy.  However, there is also mild pancreatic ductal prominence within the head of the pancreas. Therefore, ERCP or MRCP follow-up is recommended to exclude an ampullary lesion.  Bilateral nonobstructing renal stones.  No hydronephrosis.  Bladder wall thickening is nonspecific given incomplete distension. Correlate with urinalysis.  Original Report Authenticated By: Waneta Martins, M.D.     No diagnosis found.    MDM   Ct with signs of PNA which is probably the cause of pt's sx.  Will treat with aveox and [facility] or return here for worsening sx.        Gwyneth Sprout, MD 11/08/11 [phone]

## 2011-11-08 NOTE — ED Notes (Signed)
Fax rcvd from [facility] [phone] regard Rx E prescribed by Dr Lacretia Nicks. [name] for Avelox not being covered by pts insurance.  Chart reviewed by Dr Effie Shy new Rx written for "Levaquin 500 mg # 10 1 every Day until gone"  New Rx called to pharmacy by current Clinical research associate.

## 2011-11-09 NOTE — ED Provider Notes (Signed)
Medical screening examination/treatment/procedure(s) were performed by non-physician practitioner and as supervising physician I was immediately available for consultation/collaboration.   Kionte Baumgardner, MD 11/09/11 0006 

## 2011-11-10 LAB — URINE CULTURE
Colony Count: 70000
Culture  Setup Time: 201211210912

## 2011-11-11 NOTE — ED Notes (Signed)
+   urine culture. Treated with Avelox, sensitive to same per protocol MD.

## 2011-12-28 ENCOUNTER — Encounter: Payer: Self-pay | Admitting: *Deleted

## 2012-12-31 ENCOUNTER — Encounter (HOSPITAL_COMMUNITY): Payer: Self-pay | Admitting: *Deleted

## 2012-12-31 ENCOUNTER — Emergency Department (HOSPITAL_COMMUNITY)
Admission: EM | Admit: 2012-12-31 | Discharge: 2012-12-31 | Disposition: A | Discharge status: Home / Self Care | Payer: Medicaid Other | Source: Home / Self Care

## 2012-12-31 ENCOUNTER — Emergency Department (INDEPENDENT_AMBULATORY_CARE_PROVIDER_SITE_OTHER): Payer: Medicaid Other

## 2012-12-31 DIAGNOSIS — J111 Influenza due to unidentified influenza virus with other respiratory manifestations: Secondary | ICD-10-CM

## 2012-12-31 MED ORDER — PHENYLEPHRINE-CHLORPHEN-DM 10-4-12.5 MG/5ML PO LIQD: 5.0000 mL | ORAL | Status: DC | PRN

## 2012-12-31 MED ORDER — OSELTAMIVIR PHOSPHATE 75 MG PO CAPS: 75.0000 mg | ORAL_CAPSULE | Freq: Two times a day (BID) | ORAL | Status: DC

## 2012-12-31 NOTE — ED Provider Notes (Signed)
History     CSN: [id number]  Arrival date & time 12/31/12  1033   First MD Initiated Contact with Patient 12/31/12 1108      Chief Complaint  Patient presents with  . Influenza    (Consider location/radiation/quality/duration/timing/severity/associated sxs/prior treatment) HPI Comments: As above, patient became ill last night around 9:00 with the onset of bodyaches, headaches sore throat, upper respiratory congestion malaise and fatigue. She says she has mild shortness of breath but also has a history of asthma. She states she has facial pain and dizziness. She denies any known fever. Denies GI or GU symptoms   Past Medical History  Diagnosis Date  . GERD (gastroesophageal reflux disease)   . Thyroid disease   . Asthma   . Hyperlipemia 11/07/2011     Past Surgical History  Procedure Date  . Abdominal hysterectomy     Family History  Problem Relation Age of Onset  . Heart failure Mother   . Diabetes Mother     History  Substance Use Topics  . Smoking status: Never Smoker   . Smokeless tobacco: Not on file  . Alcohol Use: No    OB History    Grav Para Term Preterm Abortions TAB SAB Ect Mult Living                  Review of Systems  Constitutional: Positive for fever and activity change. Negative for chills, appetite change and fatigue.  HENT: Positive for congestion and sore throat. Negative for facial swelling, rhinorrhea, trouble swallowing, neck pain, neck stiffness and postnasal drip.   Eyes: Negative.   Respiratory: Positive for cough and shortness of breath.   Cardiovascular: Negative.   Gastrointestinal: Negative.   Genitourinary: Negative.   Musculoskeletal: Positive for myalgias.  Skin: Negative for pallor and rash.  Neurological: Negative.     Allergies  Review of patient's allergies indicates no known allergies.  Home Medications   Current Outpatient Rx  Name  Route  Sig  Dispense  Refill  . ALBUTEROL SULFATE HFA 108 (90 BASE) MCG/ACT IN  AERS   Inhalation   Inhale 2 puffs into the lungs every 6 (six) hours as needed.           Marland Kitchen FLUTICASONE PROPIONATE 50 MCG/ACT NA SUSP   Nasal   Place 2 sprays into the nose daily.           Marland Kitchen LEVOTHYROXINE SODIUM 137 MCG PO TABS   Oral   Take 137 mcg by mouth daily.           Marland Kitchen LEVOTHYROXINE SODIUM 137 MCG PO TABS   Oral   Take 137 mcg by mouth daily.           Marland Kitchen OMEPRAZOLE 40 MG PO CPDR   Oral   Take 40 mg by mouth daily.           Marland Kitchen OMEPRAZOLE 40 MG PO CPDR   Oral   Take 40 mg by mouth daily.           Marland Kitchen SIMVASTATIN 20 MG PO TABS   Oral   Take 20 mg by mouth at bedtime.           Marland Kitchen SIMVASTATIN 20 MG PO TABS   Oral   Take 20 mg by mouth at bedtime.             BP 112/65  Pulse 81  Temp 98.9 F (37.2 C) (Oral)  Resp 16  SpO2 99%  Physical Exam  Nursing note and vitals reviewed. Constitutional: She is oriented to person, place, and time. She appears well-developed and well-nourished. No distress.  HENT:       Bilateral TMs are normal Oropharynx mildly red with clear PND. No exudates  Eyes: EOM are normal.  Neck: Normal range of motion. Neck supple.  Cardiovascular: Normal rate, regular rhythm and normal heart sounds.   Pulmonary/Chest: Effort normal. No respiratory distress. She has rales.       No wheezes but there are scattered areas of crackles.  Musculoskeletal: Normal range of motion. She exhibits no edema.  Lymphadenopathy:    She has no cervical adenopathy.  Neurological: She is alert and oriented to person, place, and time.  Skin: Skin is warm and dry. No rash noted.  Psychiatric: She has a normal mood and affect.    ED Course  Procedures (including critical care time)  Labs Reviewed - No data to display Dg Chest 2 View  12/31/2012  *RADIOLOGY REPORT*  Clinical Data: Cough, shortness of breath  CHEST - 2 VIEW  Comparison: 11/07/2011  Findings: Lungs are essentially clear.  No focal consolidation. No pleural effusion or  pneumothorax.  Heart is top normal in size/mildly enlarged.  Degenerative changes of the visualized thoracolumbar spine.  IMPRESSION: No evidence of acute cardiopulmonary disease.   Original Report Authenticated By: Charline Bills, M.D.      1. Influenza-like illness       MDM  Home to rest. Drink plenty of fluids and stay well-hydrated Norell CS 1/2-1 teaspoon q. 4-6 hours when necessary cough and congestion Tamiflu 75 mg twice a day for 5 day For any worsening, new symptoms problems fever this is not respond to Tylenol or shortness of breath or other problems may return.         Hayden Rasmussen, NP 12/31/12 1320

## 2012-12-31 NOTE — ED Notes (Signed)
Pt reports flu like symptoms - headache,body ache, ear pain, throat pain, congestion - started suddenly last night

## 2013-01-04 NOTE — ED Provider Notes (Signed)
Medical screening examination/treatment/procedure(s) were performed by resident physician or non-physician practitioner and as supervising physician I was immediately available for consultation/collaboration.   Kourtlyn Charlet DOUGLAS MD.    Sayre Mazor D Jane Birkel, MD 01/04/13 1726 

## 2013-08-11 ENCOUNTER — Emergency Department (INDEPENDENT_AMBULATORY_CARE_PROVIDER_SITE_OTHER): Payer: Medicaid Other

## 2013-08-11 ENCOUNTER — Encounter (HOSPITAL_COMMUNITY): Payer: Self-pay | Admitting: Emergency Medicine

## 2013-08-11 ENCOUNTER — Emergency Department (HOSPITAL_COMMUNITY)
Admission: EM | Admit: 2013-08-11 | Discharge: 2013-08-11 | Disposition: A | Discharge status: Home / Self Care | Payer: Medicaid Other | Source: Home / Self Care | Attending: Family Medicine | Admitting: Family Medicine

## 2013-08-11 DIAGNOSIS — M62838 Other muscle spasm: Secondary | ICD-10-CM

## 2013-08-11 DIAGNOSIS — T148XXA Other injury of unspecified body region, initial encounter: Secondary | ICD-10-CM

## 2013-08-11 MED ORDER — HYDROCODONE-ACETAMINOPHEN 5-325 MG PO TABS
ORAL_TABLET | ORAL | Status: AC
  Filled 2013-08-11: qty 1

## 2013-08-11 MED ORDER — HYDROCODONE-ACETAMINOPHEN 5-325 MG PO TABS
1.0000 | ORAL_TABLET | Freq: Once | ORAL | Status: AC
  Administered 2013-08-11: 1 via ORAL

## 2013-08-11 MED ORDER — IBUPROFEN 600 MG PO TABS: 600.0000 mg | ORAL_TABLET | Freq: Three times a day (TID) | ORAL | Status: DC

## 2013-08-11 MED ORDER — KETOROLAC TROMETHAMINE 30 MG/ML IJ SOLN
INTRAMUSCULAR | Status: AC
  Filled 2013-08-11: qty 1

## 2013-08-11 MED ORDER — CYCLOBENZAPRINE HCL 5 MG PO TABS: 5.0000 mg | ORAL_TABLET | Freq: Two times a day (BID) | ORAL | Status: DC | PRN

## 2013-08-11 MED ORDER — KETOROLAC TROMETHAMINE 30 MG/ML IJ SOLN
30.0000 mg | Freq: Once | INTRAMUSCULAR | Status: AC
  Administered 2013-08-11: 30 mg via INTRAMUSCULAR

## 2013-08-11 NOTE — ED Provider Notes (Signed)
CSN: [id number]     Arrival date & time 08/11/13  0865 History     None    Chief Complaint  Patient presents with  . Arm Pain    right arm pain since yesterday. denies injury   (Consider location/radiation/quality/duration/timing/severity/associated sxs/prior Treatment) HPI Comments: Pt denies fall or injury but does report she was cleaning 2 days ago, vacuuming, cleaning under things. Yesterday pain began gradually in R upper back/shoulder, and has spread to entire RUE.   Patient is a 52 y.o. female presenting with arm pain. The history is provided by the patient.  Arm Pain This is a new problem. The current episode started yesterday. The problem occurs constantly. The problem has been gradually worsening. Pertinent negatives include no chest pain, no abdominal pain and no shortness of breath. Nothing (passive or active ROM) aggravates the symptoms. Nothing relieves the symptoms. She has tried nothing for the symptoms.    Past Medical History  Diagnosis Date  . GERD (gastroesophageal reflux disease)   . Thyroid disease   . Asthma   . Hyperlipemia 11/07/2011    Past Surgical History  Procedure Laterality Date  . Abdominal hysterectomy    . Cholecystectomy     Family History  Problem Relation Age of Onset  . Heart failure Mother   . Diabetes Mother    History  Substance Use Topics  . Smoking status: Never Smoker   . Smokeless tobacco: Not on file  . Alcohol Use: No   OB History   Grav Para Term Preterm Abortions TAB SAB Ect Mult Living                 Review of Systems  Constitutional: Negative for fever and chills.  Respiratory: Negative for shortness of breath.   Cardiovascular: Negative for chest pain.  Gastrointestinal: Negative for abdominal pain.  Musculoskeletal:       RUE/arm/neck/upper back pain  Skin: Negative for color change and rash.  Neurological: Negative for weakness and numbness.    Allergies  Review of patient's allergies indicates no known  allergies.  Home Medications   Current Outpatient Rx  Name  Route  Sig  Dispense  Refill  . levothyroxine (SYNTHROID, LEVOTHROID) 137 MCG tablet   Oral   Take 137 mcg by mouth daily.           Marland Kitchen omeprazole (PRILOSEC) 40 MG capsule   Oral   Take 40 mg by mouth daily.           Marland Kitchen albuterol (PROVENTIL HFA;VENTOLIN HFA) 108 (90 BASE) MCG/ACT inhaler   Inhalation   Inhale 2 puffs into the lungs every 6 (six) hours as needed.           . cyclobenzaprine (FLEXERIL) 5 MG tablet   Oral   Take 1 tablet (5 mg total) by mouth 2 (two) times daily as needed for muscle spasms.   20 tablet   0   . fluticasone (FLONASE) 50 MCG/ACT nasal spray   Nasal   Place 2 sprays into the nose daily.           Marland Kitchen ibuprofen (ADVIL,MOTRIN) 600 MG tablet   Oral   Take 1 tablet (600 mg total) by mouth 3 (three) times daily.   21 tablet   0   . levothyroxine (SYNTHROID, LEVOTHROID) 137 MCG tablet   Oral   Take 137 mcg by mouth daily.           Marland Kitchen omeprazole (PRILOSEC) 40 MG capsule  Oral   Take 40 mg by mouth daily.           Marland Kitchen oseltamivir (TAMIFLU) 75 MG capsule   Oral   Take 1 capsule (75 mg total) by mouth 2 (two) times daily. X 5 days   10 capsule   0   . Phenylephrine-Chlorphen-DM 09-21-11.5 MG/5ML LIQD   Oral   Take 5 mLs by mouth every 4 (four) hours as needed.   120 mL   0   . simvastatin (ZOCOR) 20 MG tablet   Oral   Take 20 mg by mouth at bedtime.           . simvastatin (ZOCOR) 20 MG tablet   Oral   Take 20 mg by mouth at bedtime.            BP 145/90  Pulse 85  Temp(Src) 98.4 F (36.9 C) (Oral)  Resp 16  SpO2 99% Physical Exam  Constitutional: She appears well-developed and well-nourished.  Appears in pain  Pulmonary/Chest: Effort normal.  Musculoskeletal:       Right shoulder: She exhibits decreased range of motion, tenderness and bony tenderness. She exhibits no swelling, no effusion and no deformity.       Cervical back: She exhibits tenderness.  She exhibits normal range of motion, no bony tenderness and no swelling.  R AC joint tender to palp, otherwise no bony tenderness to R shoulder  Neurological: She has normal strength. No sensory deficit.  Unable to completely test strength in RUE as using or moving the arm even slightly causes pain  Skin: Skin is warm, dry and intact. No rash noted.    ED Course   Procedures (including critical care time)  Labs Reviewed - No data to display Dg Shoulder Right  08/11/2013   *RADIOLOGY REPORT*  Clinical Data: Right shoulder pain without injury.  RIGHT SHOULDER - 2+ VIEW  Comparison: 07/11/2011  Findings: Mild degenerative changes of the acromioclavicular joint are seen.  No fracture or dislocation is noted.  IMPRESSION: No acute abnormality noted.   Original Report Authenticated By: Alcide Clever, M.D.   1. Muscle strain   2. Muscle spasm     MDM  Pt feels significantly better after toradol 30mg  IM and hydrocodone 5/325 x1 here at [facility]. X-ray neg, although oa at [facility] joint probably explains tenderness to palp in that area.  Rx flexeril 5mg  BID prn #20 and ibuprofen 600mg  TID #21. Pt to f/u with pcp if no improvement with this plan.   Cathlyn Parsons, NP 08/11/13 1018

## 2013-08-11 NOTE — ED Notes (Signed)
Reports severe right arm pain. States pain is sharp and radiates up through shoulder blade.  Unable able to raise arm above head with out severe pain. Denies injury.   Pt has not tried any otc meds for pain.

## 2013-08-12 NOTE — ED Provider Notes (Signed)
Medical screening examination/treatment/procedure(s) were performed by non-physician practitioner and as supervising physician I was immediately available for consultation/collaboration.   MORENO-COLL,Kelsie Kramp; MD  Terez Freimark Moreno-Coll, MD 08/12/13 1040 

## 2014-02-07 ENCOUNTER — Encounter (HOSPITAL_COMMUNITY): Payer: Self-pay | Admitting: Emergency Medicine

## 2014-02-07 ENCOUNTER — Emergency Department (HOSPITAL_COMMUNITY)
Admission: EM | Admit: 2014-02-07 | Discharge: 2014-02-07 | Disposition: A | Discharge status: Home / Self Care | Payer: Medicaid Other | Attending: Emergency Medicine | Admitting: Emergency Medicine

## 2014-02-07 DIAGNOSIS — E785 Hyperlipidemia, unspecified: Secondary | ICD-10-CM | POA: Insufficient documentation

## 2014-02-07 DIAGNOSIS — J45909 Unspecified asthma, uncomplicated: Secondary | ICD-10-CM | POA: Insufficient documentation

## 2014-02-07 DIAGNOSIS — Z79899 Other long term (current) drug therapy: Secondary | ICD-10-CM | POA: Insufficient documentation

## 2014-02-07 DIAGNOSIS — E079 Disorder of thyroid, unspecified: Secondary | ICD-10-CM | POA: Insufficient documentation

## 2014-02-07 DIAGNOSIS — K219 Gastro-esophageal reflux disease without esophagitis: Secondary | ICD-10-CM | POA: Insufficient documentation

## 2014-02-07 DIAGNOSIS — B86 Scabies: Secondary | ICD-10-CM | POA: Insufficient documentation

## 2014-02-07 DIAGNOSIS — R21 Rash and other nonspecific skin eruption: Secondary | ICD-10-CM

## 2014-02-07 MED ORDER — DEXAMETHASONE SODIUM PHOSPHATE 10 MG/ML IJ SOLN
10.0000 mg | Freq: Once | INTRAMUSCULAR | Status: AC
  Administered 2014-02-07: 10 mg via INTRAMUSCULAR
  Filled 2014-02-07: qty 1

## 2014-02-07 MED ORDER — PERMETHRIN 5 % EX CREA: TOPICAL_CREAM | CUTANEOUS | Status: DC

## 2014-02-07 NOTE — Discharge Instructions (Signed)
Scabies  Scabies are small bugs (mites) that burrow under the skin and cause red bumps and severe itching. These bugs can only be seen with a microscope. Scabies are highly contagious. They can spread easily from person to person by direct contact. They are also spread through sharing clothing or linens that have the scabies mites living in them. It is not unusual for an entire family to become infected through shared towels, clothing, or bedding.   HOME CARE INSTRUCTIONS    Your caregiver may prescribe a cream or lotion to kill the mites. If cream is prescribed, massage the cream into the entire body from the neck to the bottom of both feet. Also massage the cream into the scalp and face if your child is less than 1 year old. Avoid the eyes and mouth. Do not wash your hands after application.   Leave the cream on for 8 to 12 hours. Your child should bathe or shower after the 8 to 12 hour application period. Sometimes it is helpful to apply the cream to your child right before bedtime.   One treatment is usually effective and will eliminate approximately 95% of infestations. For severe cases, your caregiver may decide to repeat the treatment in 1 week. Everyone in your household should be treated with one application of the cream.   New rashes or burrows should not appear within 24 to 48 hours after successful treatment. However, the itching and rash may last for 2 to 4 weeks after successful treatment. Your caregiver may prescribe a medicine to help with the itching or to help the rash go away more quickly.   Scabies can live on clothing or linens for up to 3 days. All of your child's recently used clothing, towels, stuffed toys, and bed linens should be washed in hot water and then dried in a dryer for at least 20 minutes on high heat. Items that cannot be washed should be enclosed in a plastic bag for at least 3 days.   To help relieve itching, bathe your child in a cool bath or apply cool washcloths to the  affected areas.   Your child may return to school after treatment with the prescribed cream.  SEEK MEDICAL CARE IF:    The itching persists longer than 4 weeks after treatment.   The rash spreads or becomes infected. Signs of infection include red blisters or yellow-tan crust.  Document Released: 12/04/2005 Document Revised: 02/26/2012 Document Reviewed: 04/14/2009  [employer] Patient Information 2014 [employer]  Rash  A rash is a change in the color or texture of your skin. There are many different types of rashes. You may have other problems that accompany your rash.  CAUSES    Infections.   Allergic reactions. This can include allergies to pets or foods.   Certain medicines.   Exposure to certain chemicals, soaps, or cosmetics.   Heat.   Exposure to poisonous plants.   Tumors, both cancerous and noncancerous.  SYMPTOMS    Redness.   Scaly skin.   Itchy skin.   Dry or cracked skin.   Bumps.   Blisters.   Pain.  DIAGNOSIS   Your caregiver may do a physical exam to determine what type of rash you have. A skin sample (biopsy) may be taken and examined under a microscope.  TREATMENT   Treatment depends on the type of rash you have. Your caregiver may prescribe certain medicines. For serious conditions, you may need to see a skin doctor (dermatologist).    HOME CARE INSTRUCTIONS    Avoid the substance that caused your rash.   Do not scratch your rash. This can cause infection.   You may take cool baths to help stop itching.   Only take over-the-counter or prescription medicines as directed by your caregiver.   Keep all follow-up appointments as directed by your caregiver.  SEEK IMMEDIATE MEDICAL CARE IF:   You have increasing pain, swelling, or redness.   You have a fever.   You have new or severe symptoms.   You have body aches, diarrhea, or vomiting.   Your rash is not better after 3 days.  MAKE SURE YOU:   Understand these instructions.   Will watch your condition.   Will get help  right away if you are not doing well or get worse.  Document Released: 11/24/2002 Document Revised: 02/26/2012 Document Reviewed: 09/18/2011  [employer] Patient Information 2014 [employer].

## 2014-02-07 NOTE — ED Provider Notes (Signed)
CSN: [id number]     Arrival date & time 02/07/14  1704 History   This chart was scribed for non-physician practitioner, Fuller Canadaobert Garion Wempe-PA, working with Hurman HornJohn M Bednar, MD by Smiley HousemanFallon Davis, ED Scribe. This patient was seen in room TR02C/TR02C and the patient's care was started at 5:21 PM.  Chief Complaint  Patient presents with  . Rash    The history is provided by the patient. No language interpreter was used.   HPI Comments: [name] is a 53 y.o. female with a h/o of asthma and hyperlipemia presents to the Emergency Department complaining of constant itching onset 3 months ago.  Pt reports she has marks on her chest, abdomen, arms, axillary, and feet.  She reports these marks itch to the point of scratching them constantly.  Pt states she has tried OTC medications without relief.  She reports when she washes her hands they begin to burn.  Pt states she has never been diagnosed with diabetes, but states her mother had diabetes.   Past Medical History  Diagnosis Date  . GERD (gastroesophageal reflux disease)   . Thyroid disease   . Asthma   . Hyperlipemia 11/07/2011    Past Surgical History  Procedure Laterality Date  . Abdominal hysterectomy    . Cholecystectomy     Family History  Problem Relation Age of Onset  . Heart failure Mother   . Diabetes Mother    History  Substance Use Topics  . Smoking status: Never Smoker   . Smokeless tobacco: Not on file  . Alcohol Use: No   OB History   Grav Para Term Preterm Abortions TAB SAB Ect Mult Living                 Review of Systems  Constitutional: Negative for fever and chills.  Respiratory: Negative for chest tightness and shortness of breath.   Cardiovascular: Negative for chest pain.  Gastrointestinal: Negative for nausea, vomiting and diarrhea.  Skin: Positive for rash.  Neurological: Negative for headaches.  Psychiatric/Behavioral: Negative for behavioral problems and confusion.   Allergies  Review of  patient's allergies indicates no known allergies.  Home Medications   Current Outpatient Rx  Name  Route  Sig  Dispense  Refill  . albuterol (PROVENTIL HFA;VENTOLIN HFA) 108 (90 BASE) MCG/ACT inhaler   Inhalation   Inhale 2 puffs into the lungs every 6 (six) hours as needed.           . cyclobenzaprine (FLEXERIL) 5 MG tablet   Oral   Take 1 tablet (5 mg total) by mouth 2 (two) times daily as needed for muscle spasms.   20 tablet   0   . fluticasone (FLONASE) 50 MCG/ACT nasal spray   Nasal   Place 2 sprays into the nose daily.           Marland Kitchen. ibuprofen (ADVIL,MOTRIN) 600 MG tablet   Oral   Take 1 tablet (600 mg total) by mouth 3 (three) times daily.   21 tablet   0   . levothyroxine (SYNTHROID, LEVOTHROID) 137 MCG tablet   Oral   Take 137 mcg by mouth daily.           Marland Kitchen. levothyroxine (SYNTHROID, LEVOTHROID) 137 MCG tablet   Oral   Take 137 mcg by mouth daily.           Marland Kitchen. omeprazole (PRILOSEC) 40 MG capsule   Oral   Take 40 mg by mouth daily.           .Marland Kitchen  omeprazole (PRILOSEC) 40 MG capsule   Oral   Take 40 mg by mouth daily.           Marland Kitchen oseltamivir (TAMIFLU) 75 MG capsule   Oral   Take 1 capsule (75 mg total) by mouth 2 (two) times daily. X 5 days   10 capsule   0   . Phenylephrine-Chlorphen-DM 09-21-11.5 MG/5ML LIQD   Oral   Take 5 mLs by mouth every 4 (four) hours as needed.   120 mL   0   . simvastatin (ZOCOR) 20 MG tablet   Oral   Take 20 mg by mouth at bedtime.           . simvastatin (ZOCOR) 20 MG tablet   Oral   Take 20 mg by mouth at bedtime.            Triage Vitals: BP 118/86  Pulse 97  Temp(Src) 97.8 F (36.6 C) (Oral)  Resp 18  Ht 4\' 9"  (1.448 m)  SpO2 96%  Physical Exam  Nursing note and vitals reviewed. Constitutional: She is oriented to person, place, and time. She appears well-developed and well-nourished. No distress.  HENT:  Head: Normocephalic and atraumatic.  No jaundice.    Eyes: Conjunctivae and EOM are  normal. Right eye exhibits no discharge. Left eye exhibits no discharge.  Neck: Neck supple. No tracheal deviation present.  Cardiovascular: Normal rate.   Pulmonary/Chest: Effort normal. No respiratory distress.  Musculoskeletal: Normal range of motion.  Neurological: She is alert and oriented to person, place, and time.  Skin: Skin is warm and dry. No rash noted.  No obvious rash, scattered bug bites, no cellulitis, no abscess.  Psychiatric: She has a normal mood and affect. Her behavior is normal. Judgment and thought content normal.    ED Course  Procedures (including critical care time) DIAGNOSTIC STUDIES: Oxygen Saturation is 96% on RA, adequate by my interpretation.    COORDINATION OF CARE: 5:28 PM-Will order Decadron.  Will discharge with Elimite cream.  Patient informed of current plan of treatment and evaluation and agrees with plan.    MDM   Final diagnoses:  Rash   Discussed diagnosis & treatment of scabies with patient and/or parent/guardian.  They have been advised to followup with her primary care doctor 2 weeks after treatment.  They have also been advised to clean entire household, including washing sheets in warm water.   The use of permethrin cream was discussed as well, they were told to use cream from the neck down & leave on for 8-12 hours.  They've been advised to repeat treatment in one week if new eruptions occur. Patient/parent/guardian verbalized understanding.    I personally performed the services described in this documentation, which was scribed in my presence. The recorded information has been reviewed and is accurate.     Roxy Horseman, PA-C 02/08/14 0009

## 2014-02-07 NOTE — ED Notes (Signed)
Pt in stating she was dx with scabies three months ago and symptoms have not resolved, here due to continued rash.

## 2014-02-08 NOTE — ED Provider Notes (Signed)
Medical screening examination/treatment/procedure(s) were performed by non-physician practitioner and as supervising physician I was immediately available for consultation/collaboration.   Chellie Vanlue M Tyse Auriemma, MD 02/08/14 0256 

## 2014-12-15 ENCOUNTER — Encounter (HOSPITAL_COMMUNITY): Payer: Self-pay | Admitting: Emergency Medicine

## 2014-12-15 DIAGNOSIS — R0789 Other chest pain: Secondary | ICD-10-CM | POA: Diagnosis not present

## 2014-12-15 DIAGNOSIS — Z7951 Long term (current) use of inhaled steroids: Secondary | ICD-10-CM | POA: Insufficient documentation

## 2014-12-15 DIAGNOSIS — K219 Gastro-esophageal reflux disease without esophagitis: Secondary | ICD-10-CM | POA: Insufficient documentation

## 2014-12-15 DIAGNOSIS — E079 Disorder of thyroid, unspecified: Secondary | ICD-10-CM | POA: Diagnosis not present

## 2014-12-15 DIAGNOSIS — J45909 Unspecified asthma, uncomplicated: Secondary | ICD-10-CM | POA: Insufficient documentation

## 2014-12-15 DIAGNOSIS — Z79899 Other long term (current) drug therapy: Secondary | ICD-10-CM | POA: Diagnosis not present

## 2014-12-15 DIAGNOSIS — R079 Chest pain, unspecified: Secondary | ICD-10-CM | POA: Diagnosis present

## 2014-12-15 DIAGNOSIS — E785 Hyperlipidemia, unspecified: Secondary | ICD-10-CM | POA: Diagnosis not present

## 2014-12-15 LAB — BASIC METABOLIC PANEL
Anion gap: 8 (ref 5–15)
BUN: 12 mg/dL (ref 6–23)
CO2: 27 mmol/L (ref 19–32)
Calcium: 10 mg/dL (ref 8.4–10.5)
Chloride: 104 mEq/L (ref 96–112)
Creatinine, Ser: 0.69 mg/dL (ref 0.50–1.10)
GFR calc Af Amer: >90 mL/min (ref >90)
GFR calc non Af Amer: >90 mL/min (ref >90)
Glucose, Bld: 121 mg/dL — ABNORMAL HIGH (ref 70–99)
Potassium: 3.8 mmol/L (ref 3.5–5.1)
Sodium: 139 mmol/L (ref 135–145)

## 2014-12-15 LAB — CBC
HCT: 34.2 % — ABNORMAL LOW (ref 36.0–46.0)
Hemoglobin: 10.7 g/dL — ABNORMAL LOW (ref 12.0–15.0)
MCH: 22.9 pg — ABNORMAL LOW (ref 26.0–34.0)
MCHC: 31.3 g/dL (ref 30.0–36.0)
MCV: 73.2 fL — ABNORMAL LOW (ref 78.0–100.0)
Platelets: 292 10*3/uL (ref 150–400)
RBC: 4.67 MIL/uL (ref 3.87–5.11)
RDW: 14.3 % (ref 11.5–15.5)
WBC: 8.3 10*3/uL (ref 4.0–10.5)

## 2014-12-15 LAB — I-STAT TROPONIN, ED: Troponin i, poc: 0 ng/mL (ref 0.00–0.08)

## 2014-12-15 NOTE — ED Notes (Signed)
Patient arrives with complaint of penetrating chest pain which she feels in her back. States that it began today and has been persistent throughout the day. Additionally reports dizziness. States no history of the same.

## 2014-12-16 ENCOUNTER — Emergency Department (HOSPITAL_COMMUNITY): Payer: Medicaid Other

## 2014-12-16 ENCOUNTER — Emergency Department (HOSPITAL_COMMUNITY)
Admission: EM | Admit: 2014-12-16 | Discharge: 2014-12-16 | Disposition: A | Discharge status: Home / Self Care | Payer: Medicaid Other | Attending: Emergency Medicine | Admitting: Emergency Medicine

## 2014-12-16 DIAGNOSIS — R0789 Other chest pain: Secondary | ICD-10-CM

## 2014-12-16 DIAGNOSIS — R079 Chest pain, unspecified: Secondary | ICD-10-CM

## 2014-12-16 LAB — D-DIMER, QUANTITATIVE: D-Dimer, Quant: 0.41 ug/mL-FEU (ref 0.00–0.48)

## 2014-12-16 MED ORDER — HYDROCODONE-ACETAMINOPHEN 5-325 MG PO TABS
2.0000 | ORAL_TABLET | Freq: Once | ORAL | Status: AC
  Administered 2014-12-16: 2 via ORAL
  Filled 2014-12-16: qty 2

## 2014-12-16 MED ORDER — FERROUS SULFATE 325 (65 FE) MG PO TABS: 325.0000 mg | ORAL_TABLET | Freq: Every day | ORAL | Status: DC

## 2014-12-16 MED ORDER — HYDROCODONE-ACETAMINOPHEN 5-325 MG PO TABS
1.0000 | ORAL_TABLET | Freq: Once | ORAL | Status: AC
  Administered 2014-12-16: 1 via ORAL
  Filled 2014-12-16: qty 1

## 2014-12-16 NOTE — ED Notes (Signed)
Pt. Left with all belongings 

## 2014-12-16 NOTE — ED Provider Notes (Signed)
CSN: [id number]     Arrival date & time 12/15/14  2252 History   First MD Initiated Contact with Patient 12/16/14 0155     This chart was scribed for Doug SouSam Aarushi Hemric, MD by Arlan OrganAshley Leger, ED Scribe. This patient was seen in room A12C/A12C and the patient's care was started 1:57 AM.   Chief Complaint  Patient presents with  . Chest Pain  . Back Pain   HPI  HPI Comments: [name] is a 53 y.o. female with a PMHx of GERD, HTN, thyroid disease, and hyperlipidemia who presents to the Emergency Department complaining of chest pain that radiates to the back and both arms onset 12/28 at approximately 1 PM. Pain is exacerbated when moving the L arm, ambulation, changing positions, and deep breathing. She has tried prescribed Hydrocodone without any improvement for symptoms. No other associated symptoms at this time. Mother has a history of MI while in 4430's. No tobacco use or alcohol consumption. She denies any illicit drug use. No known allergies to medications. No associated shortness of breath nausea or sweatiness.  Past Medical History  Diagnosis Date  . GERD (gastroesophageal reflux disease)   . Thyroid disease   . Asthma   . Hyperlipemia 11/07/2011    Past Surgical History  Procedure Laterality Date  . Abdominal hysterectomy    . Cholecystectomy     Family History  Problem Relation Age of Onset  . Heart failure Mother   . Diabetes Mother    History  Substance Use Topics  . Smoking status: Never Smoker   . Smokeless tobacco: Not on file  . Alcohol Use: No   OB History    No data available     Review of Systems  Constitutional: Negative.   HENT: Negative.   Respiratory: Negative.   Cardiovascular: Positive for chest pain.  Gastrointestinal: Negative.   Musculoskeletal: Negative.   Skin: Negative.   Neurological: Negative.   Psychiatric/Behavioral: Negative.   All other systems reviewed and are negative.     Allergies  Review of patient's allergies indicates  no known allergies.  Home Medications   Prior to Admission medications   Medication Sig Start Date End Date Taking? Authorizing Provider  albuterol (PROVENTIL HFA;VENTOLIN HFA) 108 (90 BASE) MCG/ACT inhaler Inhale 2 puffs into the lungs every 6 (six) hours as needed.      Historical Provider, MD  cyclobenzaprine (FLEXERIL) 5 MG tablet Take 1 tablet (5 mg total) by mouth 2 (two) times daily as needed for muscle spasms. 08/11/13   Cathlyn ParsonsAngela M Kabbe, NP  fluticasone (FLONASE) 50 MCG/ACT nasal spray Place 2 sprays into the nose daily.      Historical Provider, MD  ibuprofen (ADVIL,MOTRIN) 600 MG tablet Take 1 tablet (600 mg total) by mouth 3 (three) times daily. 08/11/13   Cathlyn ParsonsAngela M Kabbe, NP  levothyroxine (SYNTHROID, LEVOTHROID) 137 MCG tablet Take 137 mcg by mouth daily.      Historical Provider, MD  levothyroxine (SYNTHROID, LEVOTHROID) 137 MCG tablet Take 137 mcg by mouth daily.      Historical Provider, MD  omeprazole (PRILOSEC) 40 MG capsule Take 40 mg by mouth daily.      Historical Provider, MD  omeprazole (PRILOSEC) 40 MG capsule Take 40 mg by mouth daily.      Historical Provider, MD  oseltamivir (TAMIFLU) 75 MG capsule Take 1 capsule (75 mg total) by mouth 2 (two) times daily. X 5 days 12/31/12   Hayden Rasmussenavid Mabe, NP  permethrin (ELIMITE) 5 % cream  Apply to entire body other than face - let sit for 14 hours then wash off, may repeat in 1 week if still having symptoms 02/07/14   Roxy Horseman, PA-C  Phenylephrine-Chlorphen-DM 09-21-11.5 MG/5ML LIQD Take 5 mLs by mouth every 4 (four) hours as needed. 12/31/12   Hayden Rasmussen, NP  simvastatin (ZOCOR) 20 MG tablet Take 20 mg by mouth at bedtime.      Historical Provider, MD  simvastatin (ZOCOR) 20 MG tablet Take 20 mg by mouth at bedtime.      Historical Provider, MD   Triage Vitals: BP 110/50 mmHg  Pulse 81  Temp(Src) 97.8 F (36.6 C) (Oral)  Resp 25  Ht 4\' 9"  (1.448 m)  Wt 185 lb (83.915 kg)  BMI 40.02 kg/m2  SpO2 100%   Physical Exam   Constitutional: She appears well-developed and well-nourished.  HENT:  Head: Normocephalic and atraumatic.  Eyes: Conjunctivae are normal. Pupils are equal, round, and reactive to light.  Neck: Neck supple. No tracheal deviation present. No thyromegaly present.  Cardiovascular: Normal rate and regular rhythm.   No murmur heard. Pulmonary/Chest: Effort normal and breath sounds normal. She exhibits tenderness.  Chest pain is easily reproducible with forcible abduction of left arm. Back pain is reproducible when patient sits up from a supine position. Radial pulses 2+ bilaterally.  Abdominal: Soft. Bowel sounds are normal. She exhibits no distension. There is no tenderness.  Musculoskeletal: Normal range of motion. She exhibits no edema or tenderness.  Neurological: She is alert. Coordination normal.  Skin: Skin is warm and dry. No rash noted.  Psychiatric: She has a normal mood and affect.  Nursing note and vitals reviewed.   ED Course  Procedures (including critical care time)  DIAGNOSTIC STUDIES: Oxygen Saturation is 100% on RA, Normal by my interpretation.    COORDINATION OF CARE: 1:55 AM-Discussed treatment plan with pt at bedside and pt agreed to plan.     Labs Review Labs Reviewed  CBC - Abnormal; Notable for the following:    Hemoglobin 10.7 (*)    HCT 34.2 (*)    MCV 73.2 (*)    MCH 22.9 (*)    All other components within normal limits  BASIC METABOLIC PANEL - Abnormal; Notable for the following:    Glucose, Bld 121 (*)    All other components within normal limits  I-STAT TROPOININ, ED    Imaging Review No results found.   EKG Interpretation   Date/Time:  Tuesday December 15 2014 23:00:14 EST Ventricular Rate:  102 PR Interval:  158 QRS Duration: 70 QT Interval:  332 QTC Calculation: 432 R Axis:   14 Text Interpretation:  Sinus tachycardia Otherwise normal ECG No  significant change since last tracing Confirmed by Ethelda Chick  MD, Sincere Berlanga  [phone]) on  12/16/2014 12:49:54 AM      chest x-ray viewed by me  Results for orders placed or performed during the hospital encounter of 12/16/14  CBC  Result Value Ref Range   WBC 8.3 4.0 - 10.5 K/uL   RBC 4.67 3.87 - 5.11 MIL/uL   Hemoglobin 10.7 (L) 12.0 - 15.0 g/dL   HCT 60.4 (L) 54.0 - 98.1 %   MCV 73.2 (L) 78.0 - 100.0 fL   MCH 22.9 (L) 26.0 - 34.0 pg   MCHC 31.3 30.0 - 36.0 g/dL   RDW 19.1 47.8 - 29.5 %   Platelets 292 150 - 400 K/uL  Basic metabolic panel  Result Value Ref Range   Sodium 139 135 -  145 mmol/L   Potassium 3.8 3.5 - 5.1 mmol/L   Chloride 104 96 - 112 mEq/L   CO2 27 19 - 32 mmol/L   Glucose, Bld 121 (H) 70 - 99 mg/dL   BUN 12 6 - 23 mg/dL   Creatinine, Ser 1.610.69 0.50 - 1.10 mg/dL   Calcium 09.610.0 8.4 - 04.510.5 mg/dL   GFR calc non Af Amer >90 >90 mL/min   GFR calc Af Amer >90 >90 mL/min   Anion gap 8 5 - 15  D-dimer, quantitative  Result Value Ref Range   D-Dimer, Quant 0.41 0.00 - 0.48 ug/mL-FEU  I-stat troponin, ED (not at [facility])  Result Value Ref Range   Troponin i, poc 0.00 0.00 - 0.08 ng/mL   Comment 3           Dg Chest 2 View  12/16/2014   CLINICAL DATA:  Back pain chest pain, shortness of breath for 1 day.  EXAM: CHEST  2 VIEW  COMPARISON:  Chest radiograph December 31, 2012  FINDINGS: Cardiac silhouette is upper limits of normal in size, unchanged. Mediastinal silhouette is nonsuspicious. Fullness of LEFT hila, unchanged and may reflect pulmonary artery. Strandy densities lingula. No pleural effusion. No pneumothorax. Surgical clips in the included right abdomen likely reflect cholecystectomy. Mild degenerative change of the thoracic spine.  IMPRESSION: Strandy densities in lingula may reflect atelectasis, possibly early pneumonia.  Borderline cardiomegaly.   Electronically Signed   By: Awilda Metroourtnay  Bloomer   On: 12/16/2014 02:34   245 am pain not improved after tx with norco. Injection offered, she declined. Additional norco ordered. 3:20 AM pain is improved patient  feels ready to go home MDM  strongly doubt pneumonia. No cough no fever no shortness of breath . Lungs clear auscultation no leukocytosis A she does exhibit mild microcytic anemia which she has had in the past  Final diagnoses:  None   Doubt ACS strongly, highly atypical story, pain mist consistent with musculskeletal pain. Low pretest clinical probability for pe , neg ddimer. Patient has prescription for hydrocodone. She can take 1-2 tablets every 4-6 hours as needed for pain. Follow-up with Dr.Osei-bonsu as outpaitient. rx iron sulfate Dx #1 atypical chest pain #842microcytic anemia  I personally performed the services described in this documentation, which was scribed in my presence. The recorded information has been reviewed and is accurate.    Doug SouSam Eunie Lawn, MD 12/16/14 [phone]

## 2014-12-16 NOTE — Discharge Instructions (Signed)
Chest Pain (Nonspecific) You can take your hydrocodone tablets 1 or 2 every 4-6 hours as needed for severe pain or take Tylenol for mild pain. Don't take the hydrocodone tablets together with Tylenol, as the combination could be dangerous. You are mildly anemic. Your blood hemoglobin is 10.7 today which is low. You've been prescribed iron tablets which should help to build up your blood count. These tablets may cause your bowel movements to become black. Call Dr.Osei -bonsu today to schedule an office appointment. Your blood count should be rechecked in about a month. He may want to see you in the office sooner than that. Tell his office staff that you were seen in the emergency department tonight. It is often hard to give a specific diagnosis for the cause of chest pain. There is always a chance that your pain could be related to something serious, such as a heart attack or a blood clot in the lungs. You need to follow up with your health care provider for further evaluation. CAUSES   Heartburn.  Pneumonia or bronchitis.  Anxiety or stress.  Inflammation around your heart (pericarditis) or lung (pleuritis or pleurisy).  A blood clot in the lung.  A collapsed lung (pneumothorax). It can develop suddenly on its own (spontaneous pneumothorax) or from trauma to the chest.  Shingles infection (herpes zoster virus). The chest wall is composed of bones, muscles, and cartilage. Any of these can be the source of the pain.  The bones can be bruised by injury.  The muscles or cartilage can be strained by coughing or overwork.  The cartilage can be affected by inflammation and become sore (costochondritis). DIAGNOSIS  Lab tests or other studies may be needed to find the cause of your pain. Your health care provider may have you take a test called an ambulatory electrocardiogram (ECG). An ECG records your heartbeat patterns over a 24-hour period. You may also have other tests, such as:  Transthoracic  echocardiogram (TTE). During echocardiography, sound waves are used to evaluate how blood flows through your heart.  Transesophageal echocardiogram (TEE).  Cardiac monitoring. This allows your health care provider to monitor your heart rate and rhythm in real time.  Holter monitor. This is a portable device that records your heartbeat and can help diagnose heart arrhythmias. It allows your health care provider to track your heart activity for several days, if needed.  Stress tests by exercise or by giving medicine that makes the heart beat faster. TREATMENT   Treatment depends on what may be causing your chest pain. Treatment may include:  Acid blockers for heartburn.  Anti-inflammatory medicine.  Pain medicine for inflammatory conditions.  Antibiotics if an infection is present.  You may be advised to change lifestyle habits. This includes stopping smoking and avoiding alcohol, caffeine, and chocolate.  You may be advised to keep your head raised (elevated) when sleeping. This reduces the chance of acid going backward from your stomach into your esophagus. Most of the time, nonspecific chest pain will improve within 2-3 days with rest and mild pain medicine.  HOME CARE INSTRUCTIONS   If antibiotics were prescribed, take them as directed. Finish them even if you start to feel better.  For the next few days, avoid physical activities that bring on chest pain. Continue physical activities as directed.  Do not use any tobacco products, including cigarettes, chewing tobacco, or electronic cigarettes.  Avoid drinking alcohol.  Only take medicine as directed by your health care provider.  Follow your health  care provider's suggestions for further testing if your chest pain does not go away.  Keep any follow-up appointments you made. If you do not go to an appointment, you could develop lasting (chronic) problems with pain. If there is any problem keeping an appointment, call to  reschedule. SEEK MEDICAL CARE IF:   Your chest pain does not go away, even after treatment.  You have a rash with blisters on your chest.  You have a fever. SEEK IMMEDIATE MEDICAL CARE IF:   You have increased chest pain or pain that spreads to your arm, neck, jaw, back, or abdomen.  You have shortness of breath.  You have an increasing cough, or you cough up blood.  You have severe back or abdominal pain.  You feel nauseous or vomit.  You have severe weakness.  You faint.  You have chills. This is an emergency. Do not wait to see if the pain will go away. Get medical help at once. Call your local emergency services (911 in [place] Do not drive yourself to the hospital. MAKE SURE YOU:   Understand these instructions.  Will watch your condition.  Will get help right away if you are not doing well or get worse. Document Released: 09/13/2005 Document Revised: 12/09/2013 Document Reviewed: 07/09/2008 [facility] Patient Information 2015 [employer] [place]. This information is not intended to replace advice given to you by your health care provider. Make sure you discuss any questions you have with your health care provider.

## 2015-09-01 ENCOUNTER — Encounter: Payer: Self-pay | Admitting: Gastroenterology

## 2015-10-21 ENCOUNTER — Encounter: Payer: Self-pay | Admitting: Internal Medicine

## 2015-11-03 ENCOUNTER — Encounter: Payer: Medicaid Other | Admitting: Gastroenterology

## 2016-02-04 ENCOUNTER — Other Ambulatory Visit: Payer: Self-pay | Admitting: Internal Medicine

## 2016-02-04 DIAGNOSIS — Z1231 Encounter for screening mammogram for malignant neoplasm of breast: Secondary | ICD-10-CM

## 2016-03-17 ENCOUNTER — Ambulatory Visit
Admission: RE | Admit: 2016-03-17 | Discharge: 2016-03-17 | Disposition: A | Discharge status: Home / Self Care | Payer: Medicaid Other | Source: Ambulatory Visit | Attending: Internal Medicine | Admitting: Internal Medicine

## 2016-03-17 DIAGNOSIS — Z1231 Encounter for screening mammogram for malignant neoplasm of breast: Secondary | ICD-10-CM

## 2017-01-21 ENCOUNTER — Emergency Department (HOSPITAL_COMMUNITY): Payer: Medicaid Other

## 2017-01-21 ENCOUNTER — Encounter (HOSPITAL_COMMUNITY): Payer: Self-pay | Admitting: Oncology

## 2017-01-21 ENCOUNTER — Emergency Department (HOSPITAL_COMMUNITY)
Admission: EM | Admit: 2017-01-21 | Discharge: 2017-01-21 | Disposition: A | Discharge status: Home / Self Care | Payer: Medicaid Other | Attending: Emergency Medicine | Admitting: Emergency Medicine

## 2017-01-21 DIAGNOSIS — Z79899 Other long term (current) drug therapy: Secondary | ICD-10-CM | POA: Insufficient documentation

## 2017-01-21 DIAGNOSIS — J45909 Unspecified asthma, uncomplicated: Secondary | ICD-10-CM | POA: Insufficient documentation

## 2017-01-21 DIAGNOSIS — E039 Hypothyroidism, unspecified: Secondary | ICD-10-CM | POA: Insufficient documentation

## 2017-01-21 DIAGNOSIS — R1011 Right upper quadrant pain: Secondary | ICD-10-CM | POA: Insufficient documentation

## 2017-01-21 DIAGNOSIS — R109 Unspecified abdominal pain: Secondary | ICD-10-CM

## 2017-01-21 LAB — CBC
HCT: 35.1 % — ABNORMAL LOW (ref 36.0–46.0)
Hemoglobin: 11.4 g/dL — ABNORMAL LOW (ref 12.0–15.0)
MCH: 25 pg — ABNORMAL LOW (ref 26.0–34.0)
MCHC: 32.5 g/dL (ref 30.0–36.0)
MCV: 77 fL — ABNORMAL LOW (ref 78.0–100.0)
Platelets: 234 10*3/uL (ref 150–400)
RBC: 4.56 MIL/uL (ref 3.87–5.11)
RDW: 14.1 % (ref 11.5–15.5)
WBC: 9.8 10*3/uL (ref 4.0–10.5)

## 2017-01-21 LAB — COMPREHENSIVE METABOLIC PANEL
ALT: 22 U/L (ref 14–54)
AST: 27 U/L (ref 15–41)
Albumin: 4.2 g/dL (ref 3.5–5.0)
Alkaline Phosphatase: 26 U/L — ABNORMAL LOW (ref 38–126)
Anion gap: 8 (ref 5–15)
BUN: 20 mg/dL (ref 6–20)
CO2: 22 mmol/L (ref 22–32)
Calcium: 9.5 mg/dL (ref 8.9–10.3)
Chloride: 108 mmol/L (ref 101–111)
Creatinine, Ser: 0.78 mg/dL (ref 0.44–1.00)
GFR calc Af Amer: >60 mL/min (ref >60)
GFR calc non Af Amer: >60 mL/min (ref >60)
Glucose, Bld: 114 mg/dL — ABNORMAL HIGH (ref 65–99)
Potassium: 3.6 mmol/L (ref 3.5–5.1)
Sodium: 138 mmol/L (ref 135–145)
Total Bilirubin: 0.5 mg/dL (ref 0.3–1.2)
Total Protein: 7.8 g/dL (ref 6.5–8.1)

## 2017-01-21 LAB — URINALYSIS, ROUTINE W REFLEX MICROSCOPIC
Bilirubin Urine: NEGATIVE
Glucose, UA: NEGATIVE mg/dL
Hgb urine dipstick: NEGATIVE
Ketones, ur: 20 mg/dL — AB
Leukocytes, UA: NEGATIVE
Nitrite: NEGATIVE
Protein, ur: 100 mg/dL — AB
Specific Gravity, Urine: 1.03 (ref 1.005–1.030)
pH: 6 (ref 5.0–8.0)

## 2017-01-21 LAB — LIPASE, BLOOD: Lipase: 14 U/L (ref 11–51)

## 2017-01-21 LAB — URINALYSIS, MICROSCOPIC (REFLEX)

## 2017-01-21 MED ORDER — IOPAMIDOL (ISOVUE-300) INJECTION 61%
INTRAVENOUS | Status: AC
  Filled 2017-01-21: qty 100

## 2017-01-21 MED ORDER — SODIUM CHLORIDE 0.9 % IV BOLUS (SEPSIS)
1000.0000 mL | Freq: Once | INTRAVENOUS | Status: AC
  Administered 2017-01-21: 1000 mL via INTRAVENOUS

## 2017-01-21 MED ORDER — ONDANSETRON HCL 8 MG PO TABS: 8.0000 mg | ORAL_TABLET | Freq: Three times a day (TID) | ORAL | 0 refills | Status: DC | PRN

## 2017-01-21 MED ORDER — OXYCODONE-ACETAMINOPHEN 5-325 MG PO TABS: 1.0000 | ORAL_TABLET | ORAL | 0 refills | Status: DC | PRN

## 2017-01-21 MED ORDER — IOPAMIDOL (ISOVUE-300) INJECTION 61%
100.0000 mL | Freq: Once | INTRAVENOUS | Status: AC | PRN
  Administered 2017-01-21: 100 mL via INTRAVENOUS

## 2017-01-21 MED ORDER — MORPHINE SULFATE (PF) 4 MG/ML IV SOLN
4.0000 mg | Freq: Once | INTRAVENOUS | Status: AC
  Administered 2017-01-21: 4 mg via INTRAVENOUS
  Filled 2017-01-21: qty 1

## 2017-01-21 MED ORDER — DEXTROSE 5 % IV SOLN
1.0000 g | Freq: Once | INTRAVENOUS | Status: AC
  Administered 2017-01-21: 1 g via INTRAVENOUS
  Filled 2017-01-21: qty 10

## 2017-01-21 MED ORDER — ONDANSETRON HCL 4 MG/2ML IJ SOLN
4.0000 mg | Freq: Once | INTRAMUSCULAR | Status: AC
  Administered 2017-01-21: 4 mg via INTRAVENOUS
  Filled 2017-01-21: qty 2

## 2017-01-21 MED ORDER — ONDANSETRON HCL 4 MG/2ML IJ SOLN
4.0000 mg | Freq: Once | INTRAMUSCULAR | Status: AC | PRN
  Administered 2017-01-21: 4 mg via INTRAVENOUS
  Filled 2017-01-21: qty 2

## 2017-01-21 NOTE — ED Triage Notes (Signed)
Pt bib [facility] from home d/t abdominal pain x 3 hours.  Pt reported N/V as well to EMS.

## 2017-01-21 NOTE — ED Provider Notes (Signed)
[facility]-EMERGENCY DEPT Provider Note   CSN: [id number] Arrival date & time: 01/21/17  0551     History   Chief Complaint Chief Complaint  Patient presents with  . Abdominal Pain    HPI [name] is a 57 y.o. female.  Patient presents with left-sided abdominal pain since 6 PM last evening with associated nausea. She is status post cholecystectomy. She did have a bowel movement yesterday. She has been eating but not as much. No fever, sweats, chills, chest pain, dyspnea, dysuria. Her pain is mild to moderate.      Past Medical History:  Diagnosis Date  . Asthma   . GERD (gastroesophageal reflux disease)   . Hyperlipemia 11/07/2011   . Thyroid disease     Patient Active Problem List   Diagnosis Date Noted  . HYPOTHYROIDISM 02/16/2009  . FRACTURE, GREAT TOE, LEFT 02/16/2009    Past Surgical History:  Procedure Laterality Date  . ABDOMINAL HYSTERECTOMY    . CHOLECYSTECTOMY      OB History    No data available       Home Medications    Prior to Admission medications   Medication Sig Start Date End Date Taking? Authorizing Provider  albuterol (PROVENTIL HFA;VENTOLIN HFA) 108 (90 BASE) MCG/ACT inhaler Inhale 2 puffs into the lungs every 6 (six) hours as needed for wheezing or shortness of breath.    Yes Historical Provider, MD  atorvastatin (LIPITOR) 40 MG tablet Take 40 mg by mouth daily.   Yes Historical Provider, MD  fluticasone (FLONASE) 50 MCG/ACT nasal spray Place 2 sprays into the nose daily as needed for allergies.    Yes Historical Provider, MD  hydrochlorothiazide (MICROZIDE) 12.5 MG capsule Take 12.5 mg by mouth daily.   Yes Historical Provider, MD  levothyroxine (SYNTHROID, LEVOTHROID) 137 MCG tablet Take 137 mcg by mouth daily.     Yes Historical Provider, MD  omeprazole (PRILOSEC) 20 MG capsule Take 20 mg by mouth daily.   Yes Historical Provider, MD  cyclobenzaprine (FLEXERIL) 5 MG tablet Take 1 tablet (5 mg total) by mouth 2 (two) times daily  as needed for muscle spasms. Patient not taking: Reported on 01/21/2017 08/11/13   Cathlyn Parsons, NP  ferrous sulfate 325 (65 FE) MG tablet Take 1 tablet (325 mg total) by mouth daily. Patient not taking: Reported on 01/21/2017 12/16/14   Doug Sou, MD  ibuprofen (ADVIL,MOTRIN) 600 MG tablet Take 1 tablet (600 mg total) by mouth 3 (three) times daily. Patient not taking: Reported on 12/16/2014 08/11/13   Cathlyn Parsons, NP  ondansetron (ZOFRAN) 8 MG tablet Take 1 tablet (8 mg total) by mouth every 8 (eight) hours as needed for nausea or vomiting. 01/21/17   Donnetta Hutching, MD  oseltamivir (TAMIFLU) 75 MG capsule Take 1 capsule (75 mg total) by mouth 2 (two) times daily. X 5 days Patient not taking: Reported on 12/16/2014 12/31/12   Hayden Rasmussen, NP  oxyCODONE-acetaminophen (PERCOCET/ROXICET) 5-325 MG tablet Take 1-2 tablets by mouth every 4 (four) hours as needed for severe pain. 01/21/17   Donnetta Hutching, MD  permethrin (ELIMITE) 5 % cream Apply to entire body other than face - let sit for 14 hours then wash off, may repeat in 1 week if still having symptoms Patient not taking: Reported on 12/16/2014 02/07/14   Roxy Horseman, PA-C  Phenylephrine-Chlorphen-DM 09-21-11.5 MG/5ML LIQD Take 5 mLs by mouth every 4 (four) hours as needed. Patient not taking: Reported on 12/16/2014 12/31/12   Hayden Rasmussen, NP  Family History Family History  Problem Relation Age of Onset  . Heart failure Mother   . Diabetes Mother     Social History Social History  Substance Use Topics  . Smoking status: Never Smoker  . Smokeless tobacco: Never Used  . Alcohol use No     Allergies   Patient has no known allergies.   Review of Systems Review of Systems  All other systems reviewed and are negative.    Physical Exam Updated Vital Signs BP (!) 94/53   Pulse 94   Temp 98.7 F (37.1 C) (Rectal)   Resp 22   Ht 4\' 9"  (1.448 m)   Wt 190 lb (86.2 kg)   SpO2 93%   BMI 41.12 kg/m   Physical Exam  Constitutional:  She is oriented to person, place, and time. She appears well-developed and well-nourished.  HENT:  Head: Normocephalic and atraumatic.  Eyes: Conjunctivae are normal.  Neck: Neck supple.  Cardiovascular: Normal rate and regular rhythm.   Pulmonary/Chest: Effort normal and breath sounds normal.  Abdominal:  Min left upper quadrant tenderness  Musculoskeletal: Normal range of motion.  Neurological: She is alert and oriented to person, place, and time.  Skin: Skin is warm and dry.  Psychiatric: She has a normal mood and affect. Her behavior is normal.  Nursing note and vitals reviewed.    ED Treatments / Results  Labs (all labs ordered are listed, but only abnormal results are displayed) Labs Reviewed  COMPREHENSIVE METABOLIC PANEL - Abnormal; Notable for the following:       Result Value   Glucose, Bld 114 (*)    Alkaline Phosphatase 26 (*)    All other components within normal limits  CBC - Abnormal; Notable for the following:    Hemoglobin 11.4 (*)    HCT 35.1 (*)    MCV 77.0 (*)    MCH 25.0 (*)    All other components within normal limits  URINALYSIS, ROUTINE W REFLEX MICROSCOPIC - Abnormal; Notable for the following:    Ketones, ur 20 (*)    Protein, ur 100 (*)    All other components within normal limits  URINALYSIS, MICROSCOPIC (REFLEX) - Abnormal; Notable for the following:    Bacteria, UA MANY (*)    Squamous Epithelial / LPF 0-5 (*)    All other components within normal limits  URINE CULTURE  LIPASE, BLOOD    EKG  EKG Interpretation None       Radiology Ct Abdomen Pelvis W Contrast  Result Date: 01/21/2017 CLINICAL DATA:  Right upper quadrant pain. EXAM: CT ABDOMEN AND PELVIS WITH CONTRAST TECHNIQUE: Multidetector CT imaging of the abdomen and pelvis was performed using the standard protocol following bolus administration of intravenous contrast. CONTRAST:  ISOVUE-300 IOPAMIDOL (ISOVUE-300) INJECTION 61% COMPARISON:  July 24, 2008 FINDINGS: Lower  chest: No acute abnormality. Hepatobiliary: The patient is status post cholecystectomy with intra and extrahepatic biliary duct prominence identified. Mild hepatic steatosis. No focal masses in the liver. The portal vein is normal. Pancreas: Unremarkable. No pancreatic ductal dilatation or surrounding inflammatory changes. Spleen: Normal in size without focal abnormality. Adrenals/Urinary Tract: The adrenal glands are normal. Bilateral renal cysts are noted. Stones are seen in both kidneys with no hydronephrosis. A representative stone in the lower pole the right measures 5.8 mm. No ureterectasis or ureteral stones. The bladder is normal in appearance. Stomach/Bowel: The stomach and small bowel are normal. The colon and appendix are normal. Vascular/Lymphatic: Atherosclerosis is seen in the non  aneurysmal aorta. No dissection. No adenopathy. Reproductive: Patient is status post hysterectomy. The ovaries appear to have been retained with no adnexal masses. Other: Large fat containing ventral hernia on image 47. No free air or free fluid. Musculoskeletal: No acute or significant osseous findings. IMPRESSION: 1. Intra and extrahepatic biliary duct prominence may simply be due to previous cholecystectomy. Recommend correlation with labs. 2. Nonobstructive renal stones. 3. Atherosclerosis in the abdominal aorta. 4. Large fat containing ventral hernia. Electronically Signed   By: Gerome Samavid  Williams III M.D   On: 01/21/2017 08:12    Procedures Procedures (including critical care time)  Medications Ordered in ED Medications  ondansetron [facility](ZOFRAN) injection 4 mg (4 mg Intravenous Given 01/21/17 0625)  sodium chloride 0.9 % bolus 1,000 mL (0 mLs Intravenous Stopped 01/21/17 0820)  ondansetron (ZOFRAN) injection 4 mg (4 mg Intravenous Given 01/21/17 0743)  morphine 4 MG/ML injection 4 mg (4 mg Intravenous Given 01/21/17 0742)  iopamidol (ISOVUE-300) 61 % injection 100 mL (100 mLs Intravenous Contrast Given 01/21/17 0744)    sodium chloride 0.9 % bolus 1,000 mL (0 mLs Intravenous Stopped 01/21/17 1216)  ondansetron (ZOFRAN) injection 4 mg (4 mg Intravenous Given 01/21/17 1110)  morphine 4 MG/ML injection 4 mg (4 mg Intravenous Given 01/21/17 1110)  cefTRIAXone (ROCEPHIN) 1 g in dextrose 5 % 50 mL IVPB (0 g Intravenous Stopped 01/21/17 1216)     Initial Impression / Assessment and Plan / ED Course  I have reviewed the triage vital signs and the nursing notes.  Pertinent labs & imaging results that were available during my care of the patient were reviewed by me and considered in my medical decision making (see chart for details).     No acute abdomen. CT scan shows no acute pathology. Patient feels better after IV fluids and pain management. Discharge medications Zofran 8 mg and Percocet. She will return if worse.  Final Clinical Impressions(s) / ED Diagnoses   Final diagnoses:  Abdominal pain, unspecified abdominal location    New Prescriptions New Prescriptions   ONDANSETRON (ZOFRAN) 8 MG TABLET    Take 1 tablet (8 mg total) by mouth every 8 (eight) hours as needed for nausea or vomiting.   OXYCODONE-ACETAMINOPHEN (PERCOCET/ROXICET) 5-325 MG TABLET    Take 1-2 tablets by mouth every 4 (four) hours as needed for severe pain.     Donnetta HutchingBrian Tanelle Lanzo, MD 01/22/17 [id number]

## 2017-01-21 NOTE — Discharge Instructions (Signed)
Scan showed no life-threatening condition. Medication for pain and nausea. Recommend clear liquids tonight.

## 2017-01-21 NOTE — ED Provider Notes (Signed)
[facility]-EMERGENCY DEPT Provider Note   CSN: [id number] Arrival date & time: 01/21/17  0551     History   Chief Complaint Chief Complaint  Patient presents with  . Abdominal Pain    HPI [name] is a 56 y.o. female.  Patient reports upper abdominal pain since 6 PM yesterday with associated vomiting. She has had a cholecystectomy in the past. Recent bowel movement. No fever, sweats, chest pain, dyspnea, dysuria. Severity of symptoms mild to moderate. Nothing makes symptoms better or worse.      Past Medical History:  Diagnosis Date  . Asthma   . GERD (gastroesophageal reflux disease)   . Hyperlipemia 11/07/2011   . Thyroid disease     Patient Active Problem List   Diagnosis Date Noted  . HYPOTHYROIDISM 02/16/2009  . FRACTURE, GREAT TOE, LEFT 02/16/2009    Past Surgical History:  Procedure Laterality Date  . ABDOMINAL HYSTERECTOMY    . CHOLECYSTECTOMY      OB History    No data available       Home Medications    Prior to Admission medications   Medication Sig Start Date End Date Taking? Authorizing Provider  albuterol (PROVENTIL HFA;VENTOLIN HFA) 108 (90 BASE) MCG/ACT inhaler Inhale 2 puffs into the lungs every 6 (six) hours as needed for wheezing or shortness of breath.    Yes Historical Provider, MD  atorvastatin (LIPITOR) 40 MG tablet Take 40 mg by mouth daily.   Yes Historical Provider, MD  fluticasone (FLONASE) 50 MCG/ACT nasal spray Place 2 sprays into the nose daily as needed for allergies.    Yes Historical Provider, MD  hydrochlorothiazide (MICROZIDE) 12.5 MG capsule Take 12.5 mg by mouth daily.   Yes Historical Provider, MD  levothyroxine (SYNTHROID, LEVOTHROID) 137 MCG tablet Take 137 mcg by mouth daily.     Yes Historical Provider, MD  omeprazole (PRILOSEC) 20 MG capsule Take 20 mg by mouth daily.   Yes Historical Provider, MD  cyclobenzaprine (FLEXERIL) 5 MG tablet Take 1 tablet (5 mg total) by mouth 2 (two) times daily as needed for  muscle spasms. Patient not taking: Reported on 01/21/2017 08/11/13   Cathlyn Parsons, NP  ferrous sulfate 325 (65 FE) MG tablet Take 1 tablet (325 mg total) by mouth daily. Patient not taking: Reported on 01/21/2017 12/16/14   Doug Sou, MD  ibuprofen (ADVIL,MOTRIN) 600 MG tablet Take 1 tablet (600 mg total) by mouth 3 (three) times daily. Patient not taking: Reported on 12/16/2014 08/11/13   Cathlyn Parsons, NP  ondansetron (ZOFRAN) 8 MG tablet Take 1 tablet (8 mg total) by mouth every 8 (eight) hours as needed for nausea or vomiting. 01/21/17   Donnetta Hutching, MD  oseltamivir (TAMIFLU) 75 MG capsule Take 1 capsule (75 mg total) by mouth 2 (two) times daily. X 5 days Patient not taking: Reported on 12/16/2014 12/31/12   Hayden Rasmussen, NP  oxyCODONE-acetaminophen (PERCOCET/ROXICET) 5-325 MG tablet Take 1-2 tablets by mouth every 4 (four) hours as needed for severe pain. 01/21/17   Donnetta Hutching, MD  permethrin (ELIMITE) 5 % cream Apply to entire body other than face - let sit for 14 hours then wash off, may repeat in 1 week if still having symptoms Patient not taking: Reported on 12/16/2014 02/07/14   Roxy Horseman, PA-C  Phenylephrine-Chlorphen-DM 09-21-11.5 MG/5ML LIQD Take 5 mLs by mouth every 4 (four) hours as needed. Patient not taking: Reported on 12/16/2014 12/31/12   Hayden Rasmussen, NP    Family History Family  History  Problem Relation Age of Onset  . Heart failure Mother   . Diabetes Mother     Social History Social History  Substance Use Topics  . Smoking status: Never Smoker  . Smokeless tobacco: Never Used  . Alcohol use No     Allergies   Patient has no known allergies.   Review of Systems Review of Systems  All other systems reviewed and are negative.    Physical Exam Updated Vital Signs BP (!) 94/53   Pulse 94   Temp 98.7 F (37.1 C) (Rectal)   Resp 22   Ht 4\' 9"  (1.448 m)   Wt 190 lb (86.2 kg)   SpO2 93%   BMI 41.12 kg/m   Physical Exam  Constitutional: She is  oriented to person, place, and time. She appears well-developed and well-nourished.  HENT:  Head: Normocephalic and atraumatic.  Eyes: Conjunctivae are normal.  Neck: Neck supple.  Cardiovascular: Normal rate and regular rhythm.   Pulmonary/Chest: Effort normal and breath sounds normal.  Abdominal:  Minimal upper abdominal tenderness.  Musculoskeletal: Normal range of motion.  Neurological: She is alert and oriented to person, place, and time.  Skin: Skin is warm and dry.  Psychiatric: She has a normal mood and affect. Her behavior is normal.  Nursing note and vitals reviewed.    ED Treatments / Results  Labs (all labs ordered are listed, but only abnormal results are displayed) Labs Reviewed  COMPREHENSIVE METABOLIC PANEL - Abnormal; Notable for the following:       Result Value   Glucose, Bld 114 (*)    Alkaline Phosphatase 26 (*)    All other components within normal limits  CBC - Abnormal; Notable for the following:    Hemoglobin 11.4 (*)    HCT 35.1 (*)    MCV 77.0 (*)    MCH 25.0 (*)    All other components within normal limits  URINALYSIS, ROUTINE W REFLEX MICROSCOPIC - Abnormal; Notable for the following:    Ketones, ur 20 (*)    Protein, ur 100 (*)    All other components within normal limits  URINALYSIS, MICROSCOPIC (REFLEX) - Abnormal; Notable for the following:    Bacteria, UA MANY (*)    Squamous Epithelial / LPF 0-5 (*)    All other components within normal limits  URINE CULTURE  LIPASE, BLOOD    EKG  EKG Interpretation None       Radiology Ct Abdomen Pelvis W Contrast  Result Date: 01/21/2017 CLINICAL DATA:  Right upper quadrant pain. EXAM: CT ABDOMEN AND PELVIS WITH CONTRAST TECHNIQUE: Multidetector CT imaging of the abdomen and pelvis was performed using the standard protocol following bolus administration of intravenous contrast. CONTRAST:  100mL ISOVUE-300 IOPAMIDOL (ISOVUE-300) INJECTION 61% COMPARISON:  July 24, 2008 FINDINGS: Lower chest: No  acute abnormality. Hepatobiliary: The patient is status post cholecystectomy with intra and extrahepatic biliary duct prominence identified. Mild hepatic steatosis. No focal masses in the liver. The portal vein is normal. Pancreas: Unremarkable. No pancreatic ductal dilatation or surrounding inflammatory changes. Spleen: Normal in size without focal abnormality. Adrenals/Urinary Tract: The adrenal glands are normal. Bilateral renal cysts are noted. Stones are seen in both kidneys with no hydronephrosis. A representative stone in the lower pole the right measures 5.8 mm. No ureterectasis or ureteral stones. The bladder is normal in appearance. Stomach/Bowel: The stomach and small bowel are normal. The colon and appendix are normal. Vascular/Lymphatic: Atherosclerosis is seen in the non aneurysmal aorta. No dissection.  No adenopathy. Reproductive: Patient is status post hysterectomy. The ovaries appear to have been retained with no adnexal masses. Other: Large fat containing ventral hernia on image 47. No free air or free fluid. Musculoskeletal: No acute or significant osseous findings. IMPRESSION: 1. Intra and extrahepatic biliary duct prominence may simply be due to previous cholecystectomy. Recommend correlation with labs. 2. Nonobstructive renal stones. 3. Atherosclerosis in the abdominal aorta. 4. Large fat containing ventral hernia. Electronically Signed   By: Gerome Sam III M.D   On: 01/21/2017 08:12    Procedures Procedures (including critical care time)  Medications Ordered in ED Medications  ondansetron [facility]) injection 4 mg (4 mg Intravenous Given 01/21/17 0625)  sodium chloride 0.9 % bolus 1,000 mL (0 mLs Intravenous Stopped 01/21/17 0820)  ondansetron (ZOFRAN) injection 4 mg (4 mg Intravenous Given 01/21/17 0743)  morphine 4 MG/ML injection 4 mg (4 mg Intravenous Given 01/21/17 0742)  iopamidol (ISOVUE-300) 61 % injection 100 mL (100 mLs Intravenous Contrast Given 01/21/17 0744)  sodium chloride  0.9 % bolus 1,000 mL (0 mLs Intravenous Stopped 01/21/17 1216)  ondansetron (ZOFRAN) injection 4 mg (4 mg Intravenous Given 01/21/17 1110)  morphine 4 MG/ML injection 4 mg (4 mg Intravenous Given 01/21/17 1110)  cefTRIAXone (ROCEPHIN) 1 g in dextrose 5 % 50 mL IVPB (0 g Intravenous Stopped 01/21/17 1216)     Initial Impression / Assessment and Plan / ED Course  I have reviewed the triage vital signs and the nursing notes.  Pertinent labs & imaging results that were available during my care of the patient were reviewed by me and considered in my medical decision making (see chart for details).    No acute abdomen. White count and liver functions normal. CT of abdomen/pelvis show no acute findings. Discharge medications Percocet and Zofran 8 mg   Final Clinical Impressions(s) / ED Diagnoses   Final diagnoses:  Abdominal pain, unspecified abdominal location    New Prescriptions Discharge Medication List as of 01/21/2017  3:08 PM    START taking these medications   Details  ondansetron (ZOFRAN) 8 MG tablet Take 1 tablet (8 mg total) by mouth every 8 (eight) hours as needed for nausea or vomiting., Starting Sun 01/21/2017, Print    oxyCODONE-acetaminophen (PERCOCET/ROXICET) 5-325 MG tablet Take 1-2 tablets by mouth every 4 (four) hours as needed for severe pain., Starting Sun 01/21/2017, Print         Donnetta Hutching, MD 01/21/17 [phone]

## 2017-01-21 NOTE — ED Notes (Signed)
Patient transported to CT 

## 2017-01-22 LAB — URINE CULTURE

## 2018-02-18 IMAGING — CT CT ABD-PELV W/ CM
2 of 5 series · 16 of 46 positions shown, 18 images · IV contrast (ISOVUE)
Comparison: July 24, 2008

CLINICAL DATA: Right upper quadrant pain.

EXAM:
CT ABDOMEN AND PELVIS WITH CONTRAST
TECHNIQUE: Multidetector CT imaging of the abdomen and pelvis was performed
using the standard protocol following bolus administration of
intravenous contrast.
CONTRAST:  100mL 1JUUOA-O55 IOPAMIDOL (1JUUOA-O55) INJECTION 61%

[Series 2: abd/pel with · axial · 0.82mm/px · z∈[-389,-34]mm · 13 of 83 slices shown, 15 images]
[im 6/83  soft-tissue]
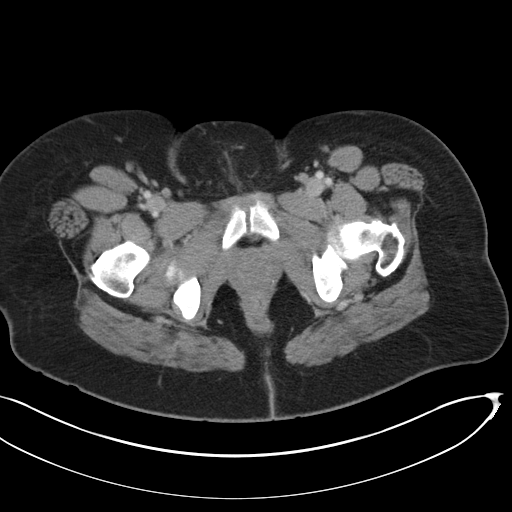
[im 6/83  bone]
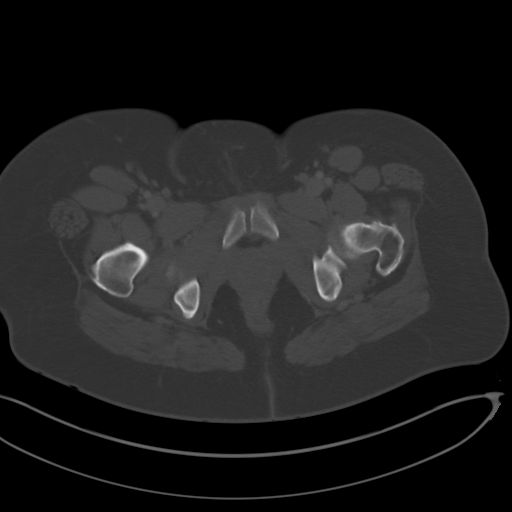
[im 12/83  soft-tissue]
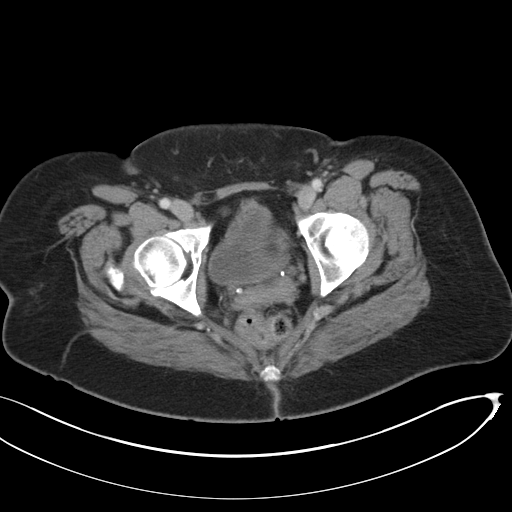
[im 18/83  soft-tissue]
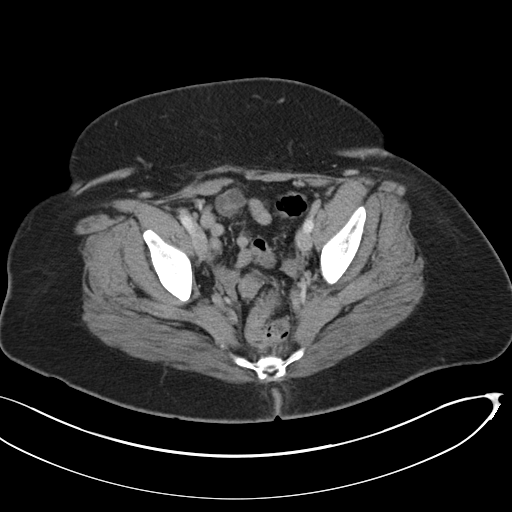
[im 24/83  soft-tissue]
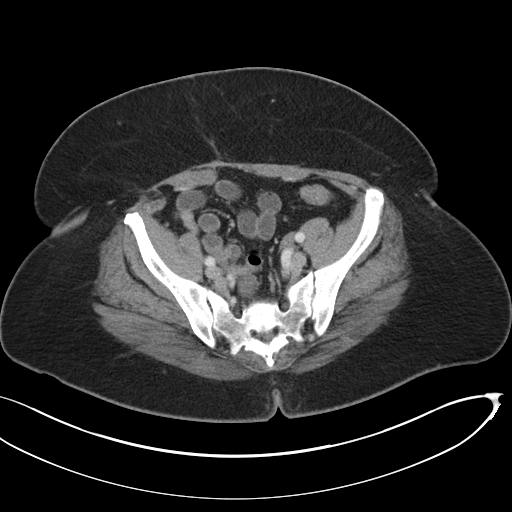
[im 30/83  soft-tissue]
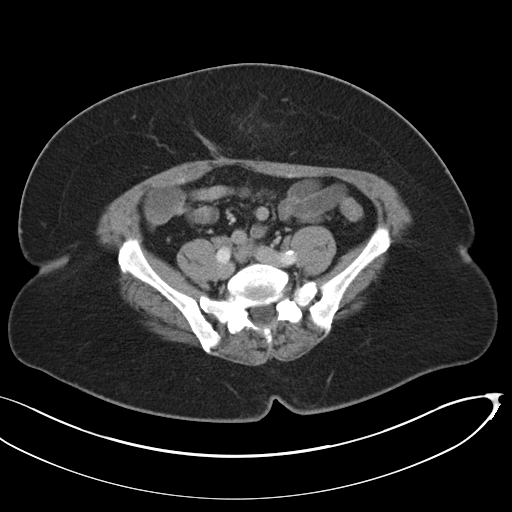
[im 36/83  soft-tissue]
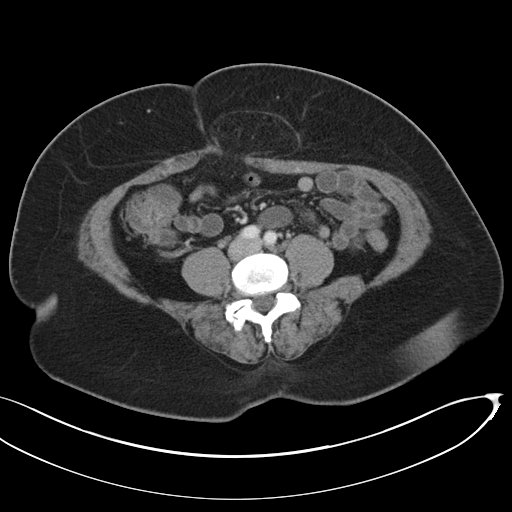
[im 42/83  soft-tissue]
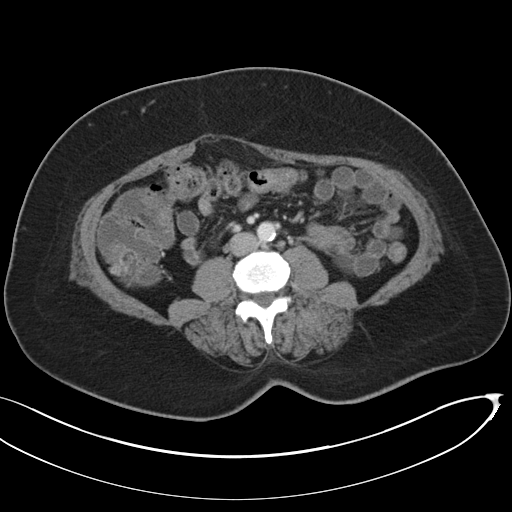
[im 47/83  soft-tissue]
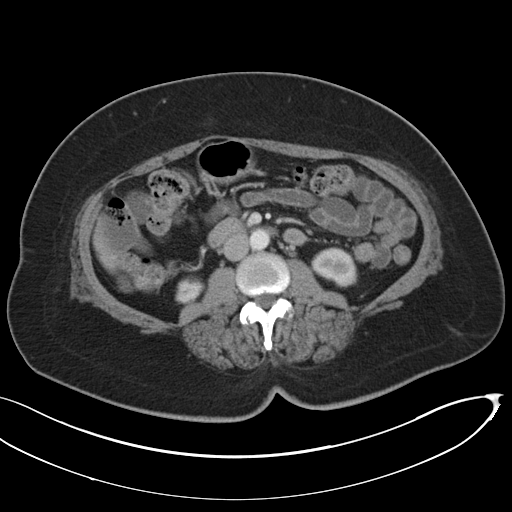
[im 53/83  soft-tissue]
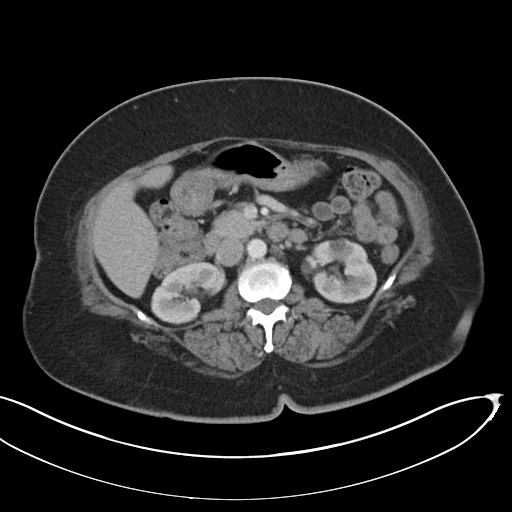
[im 53/83  bone]
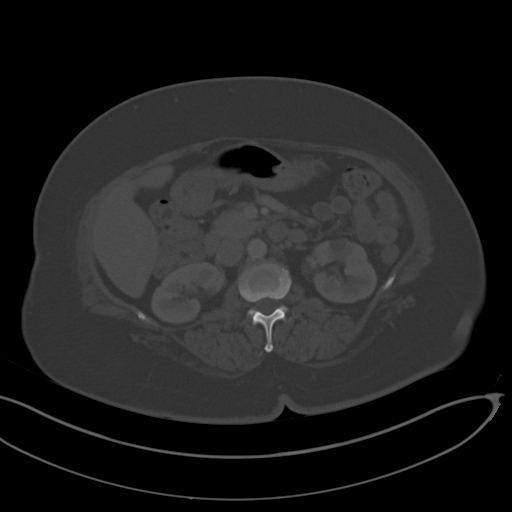
[im 59/83  soft-tissue]
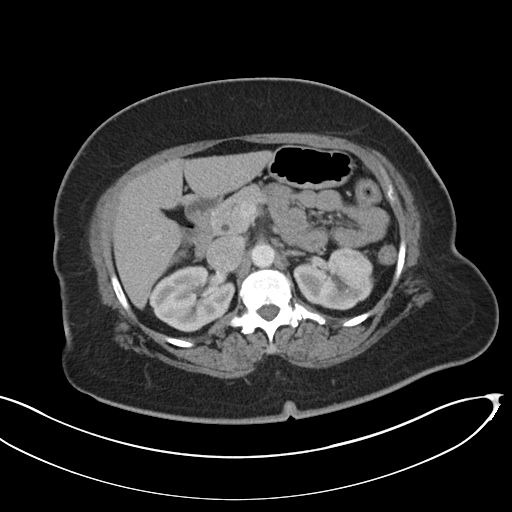
[im 65/83  soft-tissue]
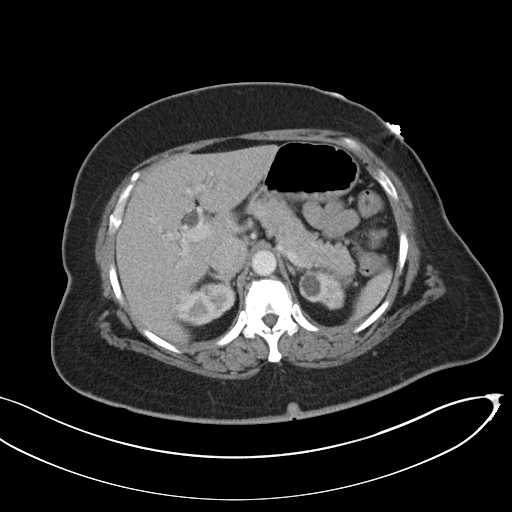
[im 71/83  soft-tissue]
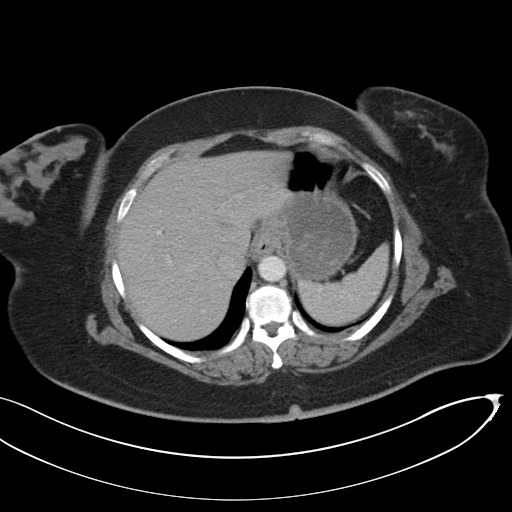
[im 77/83  soft-tissue]
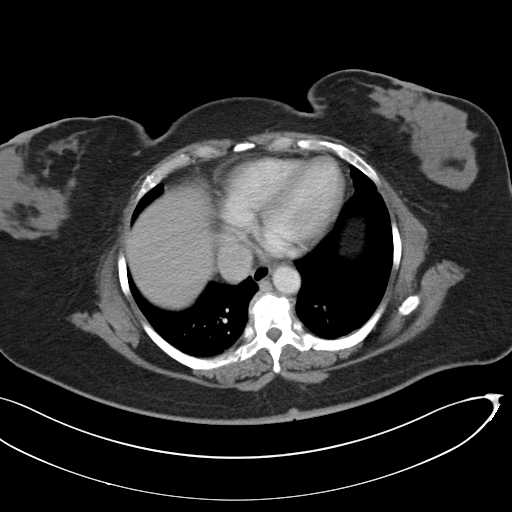

[Series 5: coronal a/|p · coronal · 0.79mm/px · 3 of 135 slices shown]
[im 45/135  soft-tissue]
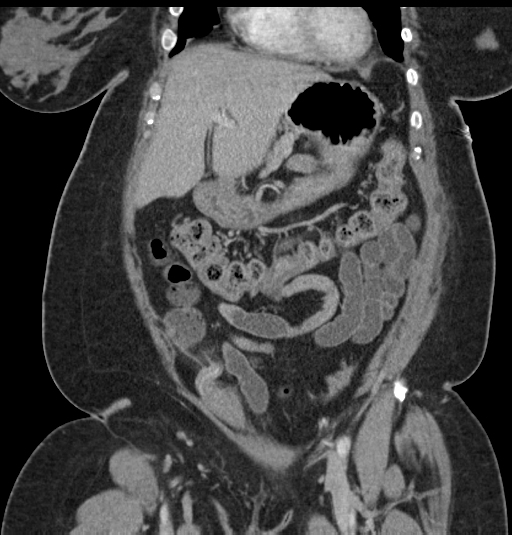
[im 60/135  soft-tissue]
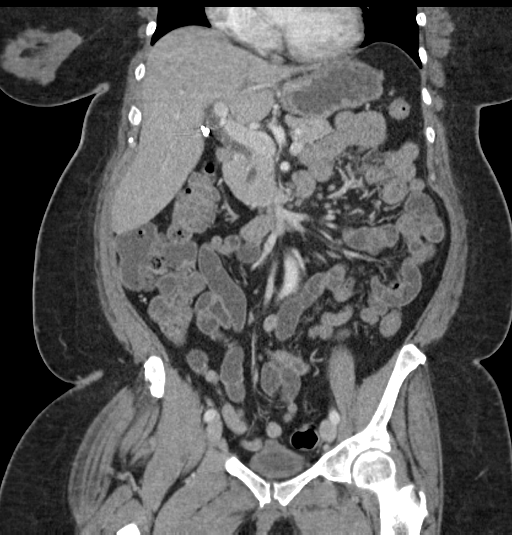
[im 75/135  soft-tissue]
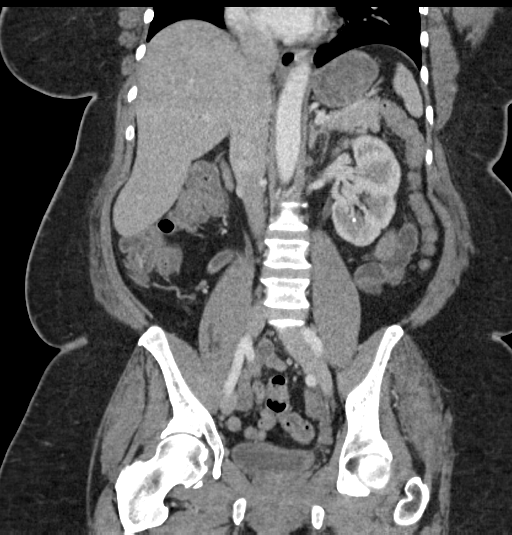

[16 of 46 positions shown; findings below may reference images not displayed]

FINDINGS: Lower chest: No acute abnormality.

Hepatobiliary: The patient is status post cholecystectomy with intra
and extrahepatic biliary duct prominence identified. Mild hepatic
steatosis. No focal masses in the liver. The portal vein is normal.

Pancreas: Unremarkable. No pancreatic ductal dilatation or
surrounding inflammatory changes.

Spleen: Normal in size without focal abnormality.

Adrenals/Urinary Tract: The adrenal glands are normal. Bilateral
renal cysts are noted. Stones are seen in both kidneys with no
hydronephrosis. A representative stone in the lower pole the right
measures 5.8 mm. No ureterectasis or ureteral stones. The bladder is
normal in appearance.

Stomach/Bowel: The stomach and small bowel are normal. The colon and
appendix are normal.

Vascular/Lymphatic: Atherosclerosis is seen in the non aneurysmal
aorta. No dissection. No adenopathy.

Reproductive: Patient is status post hysterectomy. The ovaries
appear to have been retained with no adnexal masses.

Other: Large fat containing ventral hernia on image 47. No free air
or free fluid.

Musculoskeletal: No acute or significant osseous findings.
IMPRESSION: 1. Intra and extrahepatic biliary duct prominence may simply be due
to previous cholecystectomy. Recommend correlation with labs.
2. Nonobstructive renal stones.
3. Atherosclerosis in the abdominal aorta.
4. Large fat containing ventral hernia.

## 2018-02-25 ENCOUNTER — Institutional Professional Consult (permissible substitution): Payer: Self-pay | Admitting: Pulmonary Disease

## 2018-10-11 ENCOUNTER — Institutional Professional Consult (permissible substitution): Payer: Medicaid Other | Admitting: Pulmonary Disease

## 2018-11-21 ENCOUNTER — Encounter: Payer: Self-pay | Admitting: Endocrinology

## 2018-11-29 ENCOUNTER — Institutional Professional Consult (permissible substitution): Payer: Medicaid Other | Admitting: Pulmonary Disease

## 2019-01-03 ENCOUNTER — Ambulatory Visit (INDEPENDENT_AMBULATORY_CARE_PROVIDER_SITE_OTHER): Payer: Medicaid Other | Admitting: Pulmonary Disease

## 2019-01-03 ENCOUNTER — Encounter: Payer: Self-pay | Admitting: Pulmonary Disease

## 2019-01-03 VITALS — BP 118/74 | HR 83 | Ht <= 58 in | Wt 207.0 lb

## 2019-01-03 DIAGNOSIS — Z87898 Personal history of other specified conditions: Secondary | ICD-10-CM | POA: Diagnosis not present

## 2019-01-03 NOTE — Progress Notes (Signed)
[name]    [id number]    1961/08/22  Primary Care Physician:System, Provider Not In  Referring Physician: Jackie Plumsei-Bonsu, George, MD [address] [place], [place] [id number]  Chief complaint:   History of snoring  HPI:  Patient seen for nonrestorative sleep Difficulty falling asleep, difficulty staying asleep Very light sleeper, wakes up with any sound around her  Usually does not go to sleep till about 3 or 4 AM, just lays there, ruminates a bit Usual wake up time about 11 AM  Denies dryness of the mouth, occasional headaches Admits to sweating at night Memory problems  Weight has been about the same   Outpatient Encounter Medications as of 01/03/2019  Medication Sig  . albuterol (PROVENTIL HFA;VENTOLIN HFA) 108 (90 BASE) MCG/ACT inhaler Inhale 2 puffs into the lungs every 6 (six) hours as needed for wheezing or shortness of breath.   Marland Kitchen. atorvastatin (LIPITOR) 40 MG tablet Take 40 mg by mouth daily.  . cyclobenzaprine (FLEXERIL) 5 MG tablet Take 1 tablet (5 mg total) by mouth 2 (two) times daily as needed for muscle spasms.  . ferrous sulfate 325 (65 FE) MG tablet Take 1 tablet (325 mg total) by mouth daily.  . fluticasone (FLONASE) 50 MCG/ACT nasal spray Place 2 sprays into the nose daily as needed for allergies.   . hydrochlorothiazide (MICROZIDE) 12.5 MG capsule Take 12.5 mg by mouth daily.  Marland Kitchen. ibuprofen (ADVIL,MOTRIN) 600 MG tablet Take 1 tablet (600 mg total) by mouth 3 (three) times daily.  Marland Kitchen. levothyroxine (SYNTHROID, LEVOTHROID) 137 MCG tablet Take 137 mcg by mouth daily.    Marland Kitchen. omeprazole (PRILOSEC) 20 MG capsule Take 20 mg by mouth daily.  . ondansetron (ZOFRAN) 8 MG tablet Take 1 tablet (8 mg total) by mouth every 8 (eight) hours as needed for nausea or vomiting.  Marland Kitchen. oxyCODONE-acetaminophen (PERCOCET/ROXICET) 5-325 MG tablet Take 1-2 tablets by mouth every 4 (four) hours as needed for severe pain.  Marland Kitchen. permethrin (ELIMITE) 5 % cream Apply to  entire body other than face - let sit for 14 hours then wash off, may repeat in 1 week if still having symptoms  . [DISCONTINUED] Phenylephrine-Chlorphen-DM 09-21-11.5 MG/5ML LIQD Take 5 mLs by mouth every 4 (four) hours as needed.  . [DISCONTINUED] oseltamivir (TAMIFLU) 75 MG capsule Take 1 capsule (75 mg total) by mouth 2 (two) times daily. X 5 days   No facility-administered encounter medications on file as of 01/03/2019.     Allergies as of 01/03/2019  . (No Known Allergies)    Past Medical History:  Diagnosis Date  . Asthma   . GERD (gastroesophageal reflux disease)   . Hyperlipemia 11/07/2011   . Thyroid disease     Past Surgical History:  Procedure Laterality Date  . ABDOMINAL HYSTERECTOMY    . CHOLECYSTECTOMY      Family History  Problem Relation Age of Onset  . Heart failure Mother   . Diabetes Mother     Social History   Socioeconomic History  . Marital status: Single    Spouse name: Not on file  . Number of children: Not on file  . Years of education: Not on file  . Highest education level: Not on file  Occupational History  . Not on file  Social Needs  . Financial resource strain: Not on file  . Food insecurity:    Worry: Not on file    Inability: Not on file  . Transportation  needs:    Medical: Not on file    Non-medical: Not on file  Tobacco Use  . Smoking status: Never Smoker  . Smokeless tobacco: Never Used  Substance and Sexual Activity  . Alcohol use: No  . Drug use: No  . Sexual activity: Not Currently  Lifestyle  . Physical activity:    Days per week: Not on file    Minutes per session: Not on file  . Stress: Not on file  Relationships  . Social connections:    Talks on phone: Not on file    Gets together: Not on file    Attends religious service: Not on file    Active member of club or organization: Not on file    Attends meetings of clubs or organizations: Not on file    Relationship status: Not on file  . Intimate partner  violence:    Fear of current or ex partner: Not on file    Emotionally abused: Not on file    Physically abused: Not on file    Forced sexual activity: Not on file  Other Topics Concern  . Not on file  Social History Narrative   ** Merged History Encounter **        Review of Systems  Constitutional: Positive for fatigue.  HENT: Negative.   Eyes: Negative.   Respiratory: Negative.   Cardiovascular: Negative.   Gastrointestinal: Negative.   Endocrine: Negative.   Genitourinary: Negative.   Musculoskeletal: Positive for arthralgias.    Vitals:   01/03/19 0906  BP: 118/74  Pulse: 83  SpO2: 96%     Physical Exam  Constitutional: She appears well-developed and well-nourished.  HENT:  Head: Normocephalic and atraumatic.  Micrognathia, crowded oropharynx, Mallampati 4  Eyes: Pupils are equal, round, and reactive to light. Conjunctivae and EOM are normal. Right eye exhibits no discharge. Left eye exhibits no discharge.  Neck: Normal range of motion. Neck supple. No tracheal deviation present. No thyromegaly present.  Cardiovascular: Normal rate and regular rhythm.  Pulmonary/Chest: Effort normal and breath sounds normal. No respiratory distress. She has no wheezes. She has no rales.  Abdominal: Soft. She exhibits no distension. There is no abdominal tenderness.    Results of the Epworth flowsheet 01/03/2019  Sitting and reading 3  Watching TV 2  Sitting, inactive in a public place (e.g. a theatre or a meeting) 0  As a passenger in a car for an hour without a break 3  Lying down to rest in the afternoon when circumstances permit 2  Sitting and talking to someone 1  Sitting quietly after a lunch without alcohol 3  In a car, while stopped for a few minutes in traffic 0  Total score 14   Assessment:  Moderate probability of significant obstructive sleep apnea  Sleep onset and sleep maintenance insomnia  Obesity  Plan/Recommendations: Pathophysiology of sleep  disordered breathing discussed with the patient  Treatment options discussed with the patient  Importance of increased activity and weight loss discussed  We will set the patient up with a split-night study With a significant history of insomnia, home sleep study was considered-may still be patient's best option, we will not have any diagnosis to work off, if she does not sleep well in the sleep lab  I will see her back in the office in about 3 months Encouraged to call with any significant concerns   Virl DiamondAdewale  MD [facility] 01/03/2019, 9:14 AM  CC: Jackie Plumsei-Bonsu, George, MD

## 2019-01-03 NOTE — Patient Instructions (Signed)
Moderate probability of significant obstructive sleep apnea History of anxiety  We will schedule patient for a split-night study  I will see her back in the office in about 3 months Sleep Apnea Sleep apnea is a condition in which breathing pauses or becomes shallow during sleep. Episodes of sleep apnea usually last 10 seconds or longer, and they may occur as many as 20 times an hour. Sleep apnea disrupts your sleep and keeps your body from getting the rest that it needs. This condition can increase your risk of certain health problems, including:  Heart attack.  Stroke.  Obesity.  Diabetes.  Heart failure.  Irregular heartbeat. There are three kinds of sleep apnea:  Obstructive sleep apnea. This kind is caused by a blocked or collapsed airway.  Central sleep apnea. This kind happens when the part of the brain that controls breathing does not send the correct signals to the muscles that control breathing.  Mixed sleep apnea. This is a combination of obstructive and central sleep apnea. What are the causes? The most common cause of this condition is a collapsed or blocked airway. An airway can collapse or become blocked if:  Your throat muscles are abnormally relaxed.  Your tongue and tonsils are larger than normal.  You are overweight.  Your airway is smaller than normal. What increases the risk? This condition is more likely to develop in people who:  Are overweight.  Smoke.  Have a smaller than normal airway.  Are elderly.  Are female.  Drink alcohol.  Take sedatives or tranquilizers.  Have a family history of sleep apnea. What are the signs or symptoms? Symptoms of this condition include:  Trouble staying asleep.  Daytime sleepiness and tiredness.  Irritability.  Loud snoring.  Morning headaches.  Trouble concentrating.  Forgetfulness.  Decreased interest in sex.  Unexplained sleepiness.  Mood swings.  Personality changes.  Feelings of  depression.  Waking up often during the night to urinate.  Dry mouth.  Sore throat. How is this diagnosed? This condition may be diagnosed with:  A medical history.  A physical exam.  A series of tests that are done while you are sleeping (sleep study). These tests are usually done in a sleep lab, but they may also be done at home. How is this treated? Treatment for this condition aims to restore normal breathing and to ease symptoms during sleep. It may involve managing health issues that can affect breathing, such as high blood pressure or obesity. Treatment may include:  Sleeping on your side.  Using a decongestant if you have nasal congestion.  Avoiding the use of depressants, including alcohol, sedatives, and narcotics.  Losing weight if you are overweight.  Making changes to your diet.  Quitting smoking.  Using a device to open your airway while you sleep, such as: ? An oral appliance. This is a custom-made mouthpiece that shifts your lower jaw forward. ? A continuous positive airway pressure (CPAP) device. This device delivers oxygen to your airway through a mask. ? A nasal expiratory positive airway pressure (EPAP) device. This device has valves that you put into each nostril. ? A bi-level positive airway pressure (BPAP) device. This device delivers oxygen to your airway through a mask.  Surgery if other treatments do not work. During surgery, excess tissue is removed to create a wider airway. It is important to get treatment for sleep apnea. Without treatment, this condition can lead to:  High blood pressure.  Coronary artery disease.  (Men) An inability  to achieve or maintain an erection (impotence).  Reduced thinking abilities. Follow these instructions at home:  Make any lifestyle changes that your health care provider recommends.  Eat a healthy, well-balanced diet.  Take over-the-counter and prescription medicines only as told by your health care  provider.  Avoid using depressants, including alcohol, sedatives, and narcotics.  Take steps to lose weight if you are overweight.  If you were given a device to open your airway while you sleep, use it only as told by your health care provider.  Do not use any tobacco products, such as cigarettes, chewing tobacco, and e-cigarettes. If you need help quitting, ask your health care provider.  Keep all follow-up visits as told by your health care provider. This is important. Contact a health care provider if:  The device that you received to open your airway during sleep is uncomfortable or does not seem to be working.  Your symptoms do not improve.  Your symptoms get worse. Get help right away if:  You develop chest pain.  You develop shortness of breath.  You develop discomfort in your back, arms, or stomach.  You have trouble speaking.  You have weakness on one side of your body.  You have drooping in your face. These symptoms may represent a serious problem that is an emergency. Do not wait to see if the symptoms will go away. Get medical help right away. Call your local emergency services (911 in the [place] Do not drive yourself to the hospital. This information is not intended to replace advice given to you by your health care provider. Make sure you discuss any questions you have with your health care provider. Document Released: 11/24/2002 Document Revised: 07/02/2017 Document Reviewed: 09/13/2015 Elsevier Interactive Patient Education  2019 [employer].

## 2019-02-14 ENCOUNTER — Ambulatory Visit (HOSPITAL_BASED_OUTPATIENT_CLINIC_OR_DEPARTMENT_OTHER): Payer: Medicaid Other | Attending: Pulmonary Disease | Admitting: Pulmonary Disease

## 2019-02-14 DIAGNOSIS — Z87898 Personal history of other specified conditions: Secondary | ICD-10-CM | POA: Insufficient documentation

## 2019-02-19 ENCOUNTER — Telehealth: Payer: Self-pay | Admitting: Pulmonary Disease

## 2019-02-19 DIAGNOSIS — G4733 Obstructive sleep apnea (adult) (pediatric): Secondary | ICD-10-CM

## 2019-02-19 NOTE — Procedures (Signed)
POLYSOMNOGRAPHY  Last, First: Jimenez, Hailey MRN: [id number] Gender: Female Age (years): 57 Weight (lbs): 205 DOB: 06/18/1961 BMI: 44 Primary Care: No PCP Epworth Score: 14 Referring:  A  MD Technician: McConnico, Yvonne Interpreting:  A  MD Study Type: NPSG Ordered Study Type: Split Night CPAP Study date: 02/14/2019 Location: [facility] CLINICAL INFORMATION Hailey Halls is a 57 year old Female and was referred to the sleep center for evaluation of N/A. Indications include Sleep walking/talking/parasomnias, Snoring.  MEDICATIONS Patient self administered medications include: N/A. Medications administered during study include No sleep medicine administered.  SLEEP STUDY TECHNIQUE A multi-channel overnight Polysomnography study was performed. The channels recorded and monitored were central and occipital EEG, electrooculogram (EOG), submentalis EMG (chin), nasal and oral airflow, thoracic and abdominal wall motion, anterior tibialis EMG, snore microphone, electrocardiogram, and a pulse oximetry. TECHNICIAN COMMENTS Comments added by Technician: Patient had difficulty initiating sleep. Comments added by Scorer: N/A SLEEP ARCHITECTURE The study was initiated at 10:55:19 PM and terminated at 5:02:55 AM. The total recorded time was 367.6 minutes. EEG confirmed total sleep time was 266.7 minutes yielding a sleep efficiency of 72.5%%. Sleep onset after lights out was 43.4 minutes with a REM latency of 198.0 minutes. The patient spent 2.2%% of the night in stage N1 sleep, 90.6%% in stage N2 sleep, 0.0%% in stage N3 and 7.1% in REM. Wake after sleep onset (WASO) was 57.5 minutes. The Arousal Index was 21.8/hour. RESPIRATORY PARAMETERS There were a total of 131 respiratory disturbances out of which 2 were apneas ( 2 obstructive, 0 mixed, 0 central) and 129 hypopneas. The apnea/hypopnea index (AHI) was 29.5 events/hour. The central sleep apnea index was 0.0  events/hour. The REM AHI was 63.2 events/hour and NREM AHI was 26.9 events/hour. The supine AHI was 29.5 events/hour and the non supine AHI was 0 supine during 100.00% of sleep. Respiratory disturbances were associated with oxygen desaturation down to a nadir of 79.0% during sleep. The mean oxygen saturation during the study was 95.3%. The cumulative time under 88% oxygen saturation was 5.5 minutes.  LEG MOVEMENT DATA The total leg movements were 36 with a resulting leg movement index of 8.1/hr .Associated arousal with leg movement index was 1.3/hr.  CARDIAC DATA The underlying cardiac rhythm was most consistent with sinus rhythm. Mean heart rate during sleep was 70.3 bpm. Additional rhythm abnormalities include None.   IMPRESSIONS - Moderate Obstructive Sleep apnea(OSA) - EKG showed no cardiac abnormalities. - Moderate Oxygen Desaturation - The patient snored with loud snoring volume. - No significant periodic leg movements(PLMs) during sleep. No significant associated arousals.   DIAGNOSIS - Obstructive Sleep Apnea (327.23 [G47.33 ICD-10]) - Nocturnal Hypoxemia (327.26 [G47.36 ICD-10])   RECOMMENDATIONS - Therapeutic CPAP titration to determine optimal pressure required to alleviate sleep disordered breathing. - Autotitrating CPAP therapy at pressure setting 5-15 will be of benefit. - Positional therapy avoiding supine position during sleep. - Avoid alcohol, sedatives and other CNS depressants that may worsen sleep apnea and disrupt normal sleep architecture. - Sleep hygiene should be reviewed to assess factors that may improve sleep quality. - Weight management and regular exercise should be initiated or continued.  [Electronically signed] 02/19/2019 07:44 PM    MD NPI: [id number] 

## 2019-02-19 NOTE — Progress Notes (Signed)
POLYSOMNOGRAPHY  Last, First: Hailey, Jimenez MRN: [id number] Gender: Female Age (years): 89 Weight (lbs): 205 DOB: [birth year] BMI: 44 Primary Care: No PCP Epworth Score: 14 Referring: Tomma Lightning MD Technician: Rolene Arbour Interpreting: Tomma Lightning MD Study Type: NPSG Ordered Study Type: Split Night CPAP Study date: 02/14/2019 Location: [facility] CLINICAL INFORMATION [name] is a 58 year old Female and was referred to the sleep center for evaluation of N/A. Indications include Sleep walking/talking/parasomnias, Snoring.  MEDICATIONS Patient self administered medications include: N/A. Medications administered during study include No sleep medicine administered.  SLEEP STUDY TECHNIQUE A multi-channel overnight Polysomnography study was performed. The channels recorded and monitored were central and occipital EEG, electrooculogram (EOG), submentalis EMG (chin), nasal and oral airflow, thoracic and abdominal wall motion, anterior tibialis EMG, snore microphone, electrocardiogram, and a pulse oximetry. TECHNICIAN COMMENTS Comments added by Technician: Patient had difficulty initiating sleep. Comments added by Scorer: N/A SLEEP ARCHITECTURE The study was initiated at 10:55:19 PM and terminated at 5:02:55 AM. The total recorded time was 367.6 minutes. EEG confirmed total sleep time was 266.7 minutes yielding a sleep efficiency of 72.5%%. Sleep onset after lights out was 43.4 minutes with a REM latency of 198.0 minutes. The patient spent 2.2%% of the night in stage N1 sleep, 90.6%% in stage N2 sleep, 0.0%% in stage N3 and 7.1% in REM. Wake after sleep onset (WASO) was 57.5 minutes. The Arousal Index was 21.8/hour. RESPIRATORY PARAMETERS There were a total of 131 respiratory disturbances out of which 2 were apneas ( 2 obstructive, 0 mixed, 0 central) and 129 hypopneas. The apnea/hypopnea index (AHI) was 29.5 events/hour. The central sleep apnea index was 0.0  events/hour. The REM AHI was 63.2 events/hour and NREM AHI was 26.9 events/hour. The supine AHI was 29.5 events/hour and the non supine AHI was 0 supine during 100.00% of sleep. Respiratory disturbances were associated with oxygen desaturation down to a nadir of 79.0% during sleep. The mean oxygen saturation during the study was 95.3%. The cumulative time under 88% oxygen saturation was 5.5 minutes.  LEG MOVEMENT DATA The total leg movements were 36 with a resulting leg movement index of 8.1/hr .Associated arousal with leg movement index was 1.3/hr.  CARDIAC DATA The underlying cardiac rhythm was most consistent with sinus rhythm. Mean heart rate during sleep was 70.3 bpm. Additional rhythm abnormalities include None.   IMPRESSIONS - Moderate Obstructive Sleep apnea(OSA) - EKG showed no cardiac abnormalities. - Moderate Oxygen Desaturation - The patient snored with loud snoring volume. - No significant periodic leg movements(PLMs) during sleep. No significant associated arousals.   DIAGNOSIS - Obstructive Sleep Apnea (327.23 [G47.33 ICD-10]) - Nocturnal Hypoxemia (327.26 [G47.36 ICD-10])   RECOMMENDATIONS - Therapeutic CPAP titration to determine optimal pressure required to alleviate sleep disordered breathing. - Autotitrating CPAP therapy at pressure setting 5-15 will be of benefit. - Positional therapy avoiding supine position during sleep. - Avoid alcohol, sedatives and other CNS depressants that may worsen sleep apnea and disrupt normal sleep architecture. - Sleep hygiene should be reviewed to assess factors that may improve sleep quality. - Weight management and regular exercise should be initiated or continued.  [Electronically signed] 02/19/2019 07:44 PM  Virl Diamond MD NPI: [id number]

## 2019-02-19 NOTE — Telephone Encounter (Signed)
Sleep study result  Date of study 02/14/2019  Impression: Moderate obstructive sleep apnea  Recommendation: Auto CPAP settings of 5-15 with heated humidification with patient's mask of choice.  Follow-up in the office in about 1 to 3 months following initiation of CPAP therapy.

## 2019-02-28 NOTE — Telephone Encounter (Signed)
lmom 

## 2019-03-18 NOTE — Telephone Encounter (Signed)
[email or web address]

## 2019-03-19 NOTE — Telephone Encounter (Signed)
lmom for patient to follow up

## 2019-03-20 NOTE — Telephone Encounter (Signed)
Called pt to inform of results, went straight to voicemail. Left message to call back. Also called pt's emergency contact on file, went to voicemail. Left message for them to have her call us back.   AO please advise. We have tried to reach this patient many times with no success. Would you like for [place] to send a certified letter in the mail for these results?

## 2019-03-21 NOTE — Telephone Encounter (Signed)
Called & spoke to patient to reschedule appt 04/11/2019 w/ AO per COVID-19 protocol. Placed recall for July.   Also informed pt of results from Sleep Study 02/14/2019 and AO's recommendations. Pt verbalized understanding and agreed to these recommendations. Order for CPAP start auto 5-15 with heated humidification and patient's mask of choice placed.  Scheduled a 1-3 mo f/u televisit appt w/ NP for CPAP start 04/14/2019 at 9:30 AM. I informed pt that if she doesn't hear from PCP or DME company within the next week to give [place] a call back. Nothing further needed at this time.

## 2019-03-24 NOTE — Telephone Encounter (Signed)
[place]

## 2019-04-11 ENCOUNTER — Ambulatory Visit: Payer: Medicaid Other | Admitting: Pulmonary Disease

## 2019-05-13 ENCOUNTER — Telehealth: Payer: Self-pay | Admitting: Pulmonary Disease

## 2019-05-13 NOTE — Telephone Encounter (Signed)
Called [facility] and spoke with Trula Ore in regards to pt still not receiving CPAP after order had been placed 03/24/2019 to [facility].  Trula Ore stated that if the order had been sent to [facility] is no longer with [facility] and per Trula Ore, they did not have any order or any info on pt. Trula Ore stated to me that they were able to go into pt's chart and see where the order was placed and they are going to pull this and get working on things for pt.  Called and spoke with pt letting her know the info I found out from [facility] and stated to her that they are going to work on getting everything taken care of for her so she can receive CPAP and pt verbalized understanding. Stated to pt to call office once she does receive CPAP so we can get her rescheduled for an appt and pt expressed understanding. Nothing further needed.

## 2019-05-13 NOTE — Telephone Encounter (Signed)
Called [facility] to see if patient could be enrolled into [facility]. Marissa at [employer] states patient does not use them as a dme company. ATC patient, unable to reach, lmtcb

## 2019-05-14 ENCOUNTER — Ambulatory Visit: Payer: Medicaid Other | Admitting: Pulmonary Disease

## 2019-07-31 ENCOUNTER — Telehealth: Payer: Self-pay | Admitting: Pulmonary Disease

## 2019-07-31 NOTE — Telephone Encounter (Signed)
[name]        Pt is scheduled to be set up 08/06/19 at 4pm. Just FYI.

## 2019-07-31 NOTE — Telephone Encounter (Signed)
Stat message sent to [facility] °

## 2019-07-31 NOTE — Telephone Encounter (Signed)
[facility] states they will process ASAP I have also asked them to contact the patient.

## 2020-05-08 ENCOUNTER — Ambulatory Visit: Payer: Medicaid Other | Attending: Internal Medicine

## 2020-05-08 DIAGNOSIS — Z23 Encounter for immunization: Secondary | ICD-10-CM

## 2020-05-08 NOTE — Progress Notes (Signed)
   Covid-19 Vaccination Clinic  Name:  [name]    MRN: [id number] DOB: 07/03/1961  05/08/2020  Ms. Schloesser was observed post Covid-19 immunization for 15 minutes without incident. She was provided with Vaccine Information Sheet and instruction to access the V-Safe system.   Ms. Whitener was instructed to call 911 with any severe reactions post vaccine: Marland Kitchen Difficulty breathing  . Swelling of face and throat  . A fast heartbeat  . A bad rash all over body  . Dizziness and weakness   Immunizations Administered    Name Date Dose VIS Date Route   Pfizer COVID-19 Vaccine 05/08/2020 11:53 AM 0.3 mL 02/11/2019 Intramuscular   Manufacturer: [employer], [employer]   Lot: [id number]   NDC: [address]-7

## 2020-10-04 ENCOUNTER — Emergency Department (HOSPITAL_COMMUNITY): Payer: Medicaid Other

## 2020-10-04 ENCOUNTER — Other Ambulatory Visit: Payer: Self-pay

## 2020-10-04 ENCOUNTER — Encounter (HOSPITAL_COMMUNITY): Payer: Self-pay

## 2020-10-04 ENCOUNTER — Inpatient Hospital Stay (HOSPITAL_COMMUNITY)
Admission: EM | Admit: 2020-10-04 | Discharge: 2020-10-08 | DRG: 392 | Disposition: A | Discharge status: Home / Self Care | Payer: Medicaid Other | Attending: Internal Medicine | Admitting: Internal Medicine

## 2020-10-04 DIAGNOSIS — I10 Essential (primary) hypertension: Secondary | ICD-10-CM | POA: Diagnosis present

## 2020-10-04 DIAGNOSIS — Z20822 Contact with and (suspected) exposure to covid-19: Secondary | ICD-10-CM | POA: Diagnosis present

## 2020-10-04 DIAGNOSIS — D649 Anemia, unspecified: Secondary | ICD-10-CM | POA: Diagnosis not present

## 2020-10-04 DIAGNOSIS — J45909 Unspecified asthma, uncomplicated: Secondary | ICD-10-CM | POA: Diagnosis present

## 2020-10-04 DIAGNOSIS — E039 Hypothyroidism, unspecified: Secondary | ICD-10-CM | POA: Diagnosis present

## 2020-10-04 DIAGNOSIS — Z9049 Acquired absence of other specified parts of digestive tract: Secondary | ICD-10-CM

## 2020-10-04 DIAGNOSIS — E785 Hyperlipidemia, unspecified: Secondary | ICD-10-CM | POA: Diagnosis present

## 2020-10-04 DIAGNOSIS — K289 Gastrojejunal ulcer, unspecified as acute or chronic, without hemorrhage or perforation: Secondary | ICD-10-CM | POA: Diagnosis present

## 2020-10-04 DIAGNOSIS — R509 Fever, unspecified: Secondary | ICD-10-CM | POA: Diagnosis present

## 2020-10-04 DIAGNOSIS — E872 Acidosis: Secondary | ICD-10-CM | POA: Diagnosis present

## 2020-10-04 DIAGNOSIS — R072 Precordial pain: Secondary | ICD-10-CM | POA: Diagnosis present

## 2020-10-04 DIAGNOSIS — R6520 Severe sepsis without septic shock: Secondary | ICD-10-CM | POA: Diagnosis not present

## 2020-10-04 DIAGNOSIS — M79601 Pain in right arm: Secondary | ICD-10-CM | POA: Diagnosis present

## 2020-10-04 DIAGNOSIS — D509 Iron deficiency anemia, unspecified: Secondary | ICD-10-CM | POA: Diagnosis present

## 2020-10-04 DIAGNOSIS — Z9989 Dependence on other enabling machines and devices: Secondary | ICD-10-CM | POA: Diagnosis not present

## 2020-10-04 DIAGNOSIS — K2970 Gastritis, unspecified, without bleeding: Secondary | ICD-10-CM | POA: Diagnosis present

## 2020-10-04 DIAGNOSIS — K219 Gastro-esophageal reflux disease without esophagitis: Secondary | ICD-10-CM | POA: Diagnosis present

## 2020-10-04 DIAGNOSIS — Z6841 Body Mass Index (BMI) 40.0 and over, adult: Secondary | ICD-10-CM

## 2020-10-04 DIAGNOSIS — E876 Hypokalemia: Secondary | ICD-10-CM | POA: Diagnosis present

## 2020-10-04 DIAGNOSIS — Z79899 Other long term (current) drug therapy: Secondary | ICD-10-CM | POA: Diagnosis not present

## 2020-10-04 DIAGNOSIS — A419 Sepsis, unspecified organism: Secondary | ICD-10-CM

## 2020-10-04 DIAGNOSIS — Z90710 Acquired absence of both cervix and uterus: Secondary | ICD-10-CM

## 2020-10-04 DIAGNOSIS — G4733 Obstructive sleep apnea (adult) (pediatric): Secondary | ICD-10-CM | POA: Diagnosis present

## 2020-10-04 DIAGNOSIS — R109 Unspecified abdominal pain: Secondary | ICD-10-CM | POA: Diagnosis not present

## 2020-10-04 DIAGNOSIS — E66813 Obesity, class 3: Secondary | ICD-10-CM | POA: Diagnosis present

## 2020-10-04 DIAGNOSIS — R112 Nausea with vomiting, unspecified: Secondary | ICD-10-CM | POA: Diagnosis not present

## 2020-10-04 HISTORY — DX: Obstructive sleep apnea (adult) (pediatric): G47.33

## 2020-10-04 LAB — CBC
HCT: 36 % (ref 36.0–46.0)
Hemoglobin: 11 g/dL — ABNORMAL LOW (ref 12.0–15.0)
MCH: 23.8 pg — ABNORMAL LOW (ref 26.0–34.0)
MCHC: 30.6 g/dL (ref 30.0–36.0)
MCV: 77.9 fL — ABNORMAL LOW (ref 80.0–100.0)
Platelets: 325 10*3/uL (ref 150–400)
RBC: 4.62 MIL/uL (ref 3.87–5.11)
RDW: 14 % (ref 11.5–15.5)
WBC: 12.4 10*3/uL — ABNORMAL HIGH (ref 4.0–10.5)
nRBC: 0 % (ref 0.0–0.2)

## 2020-10-04 LAB — TROPONIN I (HIGH SENSITIVITY)
Troponin I (High Sensitivity): 4 ng/L (ref <18)
Troponin I (High Sensitivity): 4 ng/L (ref <18)

## 2020-10-04 LAB — COMPREHENSIVE METABOLIC PANEL
ALT: 30 U/L (ref 0–44)
AST: 28 U/L (ref 15–41)
Albumin: 3.6 g/dL (ref 3.5–5.0)
Alkaline Phosphatase: 31 U/L — ABNORMAL LOW (ref 38–126)
Anion gap: 12 (ref 5–15)
BUN: 12 mg/dL (ref 6–20)
CO2: 24 mmol/L (ref 22–32)
Calcium: 9.9 mg/dL (ref 8.9–10.3)
Chloride: 103 mmol/L (ref 98–111)
Creatinine, Ser: 0.87 mg/dL (ref 0.44–1.00)
GFR, Estimated: >60 mL/min (ref >60)
Glucose, Bld: 133 mg/dL — ABNORMAL HIGH (ref 70–99)
Potassium: 3.2 mmol/L — ABNORMAL LOW (ref 3.5–5.1)
Sodium: 139 mmol/L (ref 135–145)
Total Bilirubin: 0.7 mg/dL (ref 0.3–1.2)
Total Protein: 7.6 g/dL (ref 6.5–8.1)

## 2020-10-04 LAB — PROTIME-INR
INR: 1 (ref 0.8–1.2)
Prothrombin Time: 12.9 seconds (ref 11.4–15.2)

## 2020-10-04 LAB — URINALYSIS, ROUTINE W REFLEX MICROSCOPIC
Bilirubin Urine: NEGATIVE
Glucose, UA: NEGATIVE mg/dL
Hgb urine dipstick: NEGATIVE
Ketones, ur: NEGATIVE mg/dL
Leukocytes,Ua: NEGATIVE
Nitrite: NEGATIVE
Protein, ur: NEGATIVE mg/dL
Specific Gravity, Urine: 1.018 (ref 1.005–1.030)
pH: 8 (ref 5.0–8.0)

## 2020-10-04 LAB — LACTIC ACID, PLASMA
Lactic Acid, Venous: 3.2 mmol/L (ref 0.5–1.9)
Lactic Acid, Venous: 2.2 mmol/L (ref 0.5–1.9)

## 2020-10-04 LAB — RESPIRATORY PANEL BY RT PCR (FLU A&B, COVID)
Influenza A by PCR: NEGATIVE
Influenza B by PCR: NEGATIVE
SARS Coronavirus 2 by RT PCR: NEGATIVE

## 2020-10-04 LAB — HIV ANTIBODY (ROUTINE TESTING W REFLEX): HIV Screen 4th Generation wRfx: NONREACTIVE

## 2020-10-04 LAB — LIPASE, BLOOD: Lipase: 24 U/L (ref 11–51)

## 2020-10-04 LAB — PROCALCITONIN: Procalcitonin: 6.67 ng/mL

## 2020-10-04 LAB — APTT: aPTT: 33 seconds (ref 24–36)

## 2020-10-04 MED ORDER — SODIUM CHLORIDE 0.9 % IV SOLN: 2.0000 g | Freq: Three times a day (TID) | INTRAVENOUS | Status: DC

## 2020-10-04 MED ORDER — IOHEXOL 300 MG/ML  SOLN
100.0000 mL | Freq: Once | INTRAMUSCULAR | Status: AC | PRN
  Administered 2020-10-04: 100 mL via INTRAVENOUS

## 2020-10-04 MED ORDER — METRONIDAZOLE IN NACL 5-0.79 MG/ML-% IV SOLN: 500.0000 mg | Freq: Three times a day (TID) | INTRAVENOUS | Status: DC

## 2020-10-04 MED ORDER — LACTATED RINGERS IV BOLUS (SEPSIS)
1000.0000 mL | Freq: Once | INTRAVENOUS | Status: AC
  Administered 2020-10-04: 1000 mL via INTRAVENOUS

## 2020-10-04 MED ORDER — ONDANSETRON HCL 4 MG/2ML IJ SOLN
4.0000 mg | Freq: Four times a day (QID) | INTRAMUSCULAR | Status: DC | PRN
  Administered 2020-10-05 – 2020-10-07 (×4): 4 mg via INTRAVENOUS
  Filled 2020-10-04 (×5): qty 2

## 2020-10-04 MED ORDER — ACETAMINOPHEN 325 MG PO TABS: 650.0000 mg | ORAL_TABLET | Freq: Four times a day (QID) | ORAL | Status: DC | PRN

## 2020-10-04 MED ORDER — SODIUM CHLORIDE 0.9 % IV SOLN
2.0000 g | Freq: Three times a day (TID) | INTRAVENOUS | Status: DC
  Administered 2020-10-04 – 2020-10-07 (×7): 2 g via INTRAVENOUS
  Filled 2020-10-04 (×7): qty 2

## 2020-10-04 MED ORDER — LACTATED RINGERS IV SOLN: INTRAVENOUS | Status: AC

## 2020-10-04 MED ORDER — LEVOTHYROXINE SODIUM 100 MCG PO TABS
100.0000 ug | ORAL_TABLET | Freq: Every day | ORAL | Status: DC
  Administered 2020-10-05 – 2020-10-06 (×2): 100 ug via ORAL
  Filled 2020-10-04 (×2): qty 1

## 2020-10-04 MED ORDER — ACETAMINOPHEN 325 MG PO TABS
650.0000 mg | ORAL_TABLET | Freq: Once | ORAL | Status: AC
  Administered 2020-10-04: 650 mg via ORAL
  Filled 2020-10-04: qty 2

## 2020-10-04 MED ORDER — VANCOMYCIN HCL IN DEXTROSE 1-5 GM/200ML-% IV SOLN
1000.0000 mg | Freq: Once | INTRAVENOUS | Status: AC
  Administered 2020-10-04: 1000 mg via INTRAVENOUS
  Filled 2020-10-04: qty 200

## 2020-10-04 MED ORDER — ALBUTEROL SULFATE HFA 108 (90 BASE) MCG/ACT IN AERS
2.0000 | INHALATION_SPRAY | Freq: Four times a day (QID) | RESPIRATORY_TRACT | Status: DC | PRN
  Administered 2020-10-06: 2 via RESPIRATORY_TRACT
  Filled 2020-10-04: qty 6.7

## 2020-10-04 MED ORDER — ACETAMINOPHEN 650 MG RE SUPP: 650.0000 mg | Freq: Four times a day (QID) | RECTAL | Status: DC | PRN

## 2020-10-04 MED ORDER — VANCOMYCIN HCL IN DEXTROSE 1-5 GM/200ML-% IV SOLN
1000.0000 mg | Freq: Two times a day (BID) | INTRAVENOUS | Status: DC
  Administered 2020-10-04 – 2020-10-06 (×5): 1000 mg via INTRAVENOUS
  Filled 2020-10-04 (×6): qty 200

## 2020-10-04 MED ORDER — ONDANSETRON HCL 4 MG PO TABS: 4.0000 mg | ORAL_TABLET | Freq: Four times a day (QID) | ORAL | Status: DC | PRN

## 2020-10-04 MED ORDER — ENOXAPARIN SODIUM 40 MG/0.4ML ~~LOC~~ SOLN
40.0000 mg | SUBCUTANEOUS | Status: DC
  Administered 2020-10-04 – 2020-10-07 (×3): 40 mg via SUBCUTANEOUS
  Filled 2020-10-04 (×2): qty 0.4

## 2020-10-04 MED ORDER — ATORVASTATIN CALCIUM 40 MG PO TABS
40.0000 mg | ORAL_TABLET | Freq: Every day | ORAL | Status: DC
  Administered 2020-10-04 – 2020-10-07 (×3): 40 mg via ORAL
  Filled 2020-10-04: qty 1
  Filled 2020-10-04: qty 4
  Filled 2020-10-04 (×2): qty 1

## 2020-10-04 MED ORDER — MORPHINE SULFATE (PF) 2 MG/ML IV SOLN
2.0000 mg | INTRAVENOUS | Status: DC | PRN
  Administered 2020-10-05: 2 mg via INTRAVENOUS
  Filled 2020-10-04: qty 1

## 2020-10-04 MED ORDER — SODIUM CHLORIDE 0.9 % IV SOLN
2.0000 g | Freq: Once | INTRAVENOUS | Status: AC
  Administered 2020-10-04: 2 g via INTRAVENOUS
  Filled 2020-10-04: qty 2

## 2020-10-04 MED ORDER — SODIUM CHLORIDE 0.9% FLUSH
3.0000 mL | Freq: Two times a day (BID) | INTRAVENOUS | Status: DC
  Administered 2020-10-06 – 2020-10-08 (×3): 3 mL via INTRAVENOUS

## 2020-10-04 MED ORDER — METRONIDAZOLE IN NACL 5-0.79 MG/ML-% IV SOLN
500.0000 mg | Freq: Three times a day (TID) | INTRAVENOUS | Status: DC
  Administered 2020-10-04 – 2020-10-07 (×8): 500 mg via INTRAVENOUS
  Filled 2020-10-04 (×8): qty 100

## 2020-10-04 MED ORDER — METRONIDAZOLE IN NACL 5-0.79 MG/ML-% IV SOLN
500.0000 mg | Freq: Once | INTRAVENOUS | Status: AC
  Administered 2020-10-04: 500 mg via INTRAVENOUS
  Filled 2020-10-04: qty 100

## 2020-10-04 NOTE — H&P (Signed)
History and Physical    Hailey BarbaraChristalyn B Pardini ZOX:[id number] DOB: 1960/12/24 DOA: 10/04/2020  PCP: [facility] Consultants:  Wynona Neatlalere - pulmonology Patient coming from:  Home - lives with daughter and grandson; NOK: Daughter, [phone]  Chief Complaint: Chest/abdominal pain  HPI: [name] is a 59 y.o. female with medical history significant of hypothyroidism; HLD; and OSA on CPAP presenting with chest/abdominal pain.   She was fine last night but awoke this AM with pain and vomiting.  She complained of chills.  She has complained of abdominal pain and R arm pain.  Also with a headache.  Also with chest pain.  +n/v/d all morning, 3-4 episodes.  No recent antibiotics.  No sick contacts.  +fever here, not noted at home.  Mild SOB on arrival, but not now and no cough.  She is complaining of acute R arm pain.  Her last knee injection was maybe 2 months ago.  She has a chronic "bone problem" for which she is on disability - if she walks too long her knees and legs give out.  She has been on disability since she was a teenager.    ED Course:  Patient was fine yesterday.  Awoke today with chills, n/v, and chest/abdominal pain.  Lactate elevated.  CT done - /source.  UA pending.  Given broad spectrum antibiotics and fluid bolus.  UA and CT unremarkable.  Review of Systems: As per HPI; otherwise review of systems reviewed and negative.   Ambulatory Status:  Ambulates without assistance  COVID Vaccine Status:  Complete  Past Medical History:  Diagnosis Date  . Asthma   . GERD (gastroesophageal reflux disease)   . Hyperlipemia 11/07/2011   . OSA on CPAP   . Thyroid disease     Past Surgical History:  Procedure Laterality Date  . ABDOMINAL HYSTERECTOMY    . CHOLECYSTECTOMY      Social History   Socioeconomic History  . Marital status: Single    Spouse name: Not on file  . Number of children: Not on file  . Years of education: Not on file  . Highest education  level: Not on file  Occupational History  . Not on file  Tobacco Use  . Smoking status: Never Smoker  . Smokeless tobacco: Never Used  Substance and Sexual Activity  . Alcohol use: No  . Drug use: No  . Sexual activity: Not Currently  Other Topics Concern  . Not on file  Social History Narrative   ** Merged History Encounter **       Social Determinants of Health   Financial Resource Strain:   . Difficulty of Paying Living Expenses: Not on file  Food Insecurity:   . Worried About Programme researcher, broadcasting/film/videounning Out of Food in the Last Year: Not on file  . Ran Out of Food in the Last Year: Not on file  Transportation Needs:   . Lack of Transportation (Medical): Not on file  . Lack of Transportation (Non-Medical): Not on file  Physical Activity:   . Days of Exercise per Week: Not on file  . Minutes of Exercise per Session: Not on file  Stress:   . Feeling of Stress : Not on file  Social Connections:   . Frequency of Communication with Friends and Family: Not on file  . Frequency of Social Gatherings with Friends and Family: Not on file  . Attends Religious Services: Not on file  . Active Member of Clubs or Organizations: Not on file  . Attends  Club or Organization Meetings: Not on file  . Marital Status: Not on file  Intimate Partner Violence:   . Fear of Current or Ex-Partner: Not on file  . Emotionally Abused: Not on file  . Physically Abused: Not on file  . Sexually Abused: Not on file    No Known Allergies  Family History  Problem Relation Age of Onset  . Heart failure Mother   . Diabetes Mother     Prior to Admission medications   Medication Sig Start Date End Date Taking? Authorizing Provider  albuterol (PROVENTIL HFA;VENTOLIN HFA) 108 (90 BASE) MCG/ACT inhaler Inhale 2 puffs into the lungs every 6 (six) hours as needed for wheezing or shortness of breath.     [provider]  atorvastatin (LIPITOR) 40 MG tablet Take 40 mg by mouth daily.    [provider]    cyclobenzaprine (FLEXERIL) 5 MG tablet Take 1 tablet (5 mg total) by mouth 2 (two) times daily as needed for muscle spasms. 08/11/13   Cathlyn Parsons, NP  ferrous sulfate 325 (65 FE) MG tablet Take 1 tablet (325 mg total) by mouth daily. 12/16/14   Doug Sou, MD  fluticasone (FLONASE) 50 MCG/ACT nasal spray Place 2 sprays into the nose daily as needed for allergies.     [provider]  hydrochlorothiazide (MICROZIDE) 12.5 MG capsule Take 12.5 mg by mouth daily.    [provider]  ibuprofen (ADVIL,MOTRIN) 600 MG tablet Take 1 tablet (600 mg total) by mouth 3 (three) times daily. 08/11/13   Cathlyn Parsons, NP  levothyroxine (SYNTHROID, LEVOTHROID) 137 MCG tablet Take 137 mcg by mouth daily.      [provider]  omeprazole (PRILOSEC) 20 MG capsule Take 20 mg by mouth daily.    [provider]  ondansetron (ZOFRAN) 8 MG tablet Take 1 tablet (8 mg total) by mouth every 8 (eight) hours as needed for nausea or vomiting. 01/21/17   Donnetta Hutching, MD  oxyCODONE-acetaminophen (PERCOCET/ROXICET) 5-325 MG tablet Take 1-2 tablets by mouth every 4 (four) hours as needed for severe pain. 01/21/17   Donnetta Hutching, MD  permethrin (ELIMITE) 5 % cream Apply to entire body other than face - let sit for 14 hours then wash off, may repeat in 1 week if still having symptoms 02/07/14   Roxy Horseman, PA-C    Physical Exam: Vitals:   10/04/20 1300 10/04/20 1400 10/04/20 1415 10/04/20 1430  BP: 102/64 99/61 (!) 89/65 (!) 95/58  Pulse: 96 86 86 86  Resp: (!) 25 (!) 25 (!) 23 (!) 22  Temp:      TempSrc:      SpO2: 100% 98% 97% 97%  Weight:      Height:         . General:  Appears ill but in NAD . Eyes:  PERRL, EOMI, normal lids, iris; mild photophobia that she relates to "retina" problems  . ENT:  grossly normal hearing, lips & tongue, mmm; edentulous . Neck:  no LAD, masses or thyromegaly . Cardiovascular:  RRR, no m/r/g. No LE edema.  Marland Kitchen Respiratory:   CTA bilaterally  with no wheezes/rales/rhonchi.  Mildly increased respiratory effort. . Abdomen:  soft, marked TTP with guarding in LUQ, ND . Skin:  no rash or induration seen on limited exam . Musculoskeletal:  grossly normal tone BUE/BLE, good ROM, no bony abnormality . Psychiatric:  grossly normal mood and affect, speech sparse but appropriate, AOx3 . Neurologic:  CN 2-12 grossly intact, moves  all extremities in coordinated fashion    Radiological Exams on Admission: CT ABDOMEN PELVIS W CONTRAST  Result Date: 10/04/2020 CLINICAL DATA:  Nausea, vomiting, diffuse abdominal pain. EXAM: CT ABDOMEN AND PELVIS WITH CONTRAST TECHNIQUE: Multidetector CT imaging of the abdomen and pelvis was performed using the standard protocol following bolus administration of intravenous contrast. CONTRAST:  OMNIPAQUE IOHEXOL 300 MG/ML  SOLN COMPARISON:  01/21/2017 FINDINGS: Lower chest: No acute abnormality. Hepatobiliary: No focal liver abnormality is seen. Status post cholecystectomy. No biliary dilatation. Pancreas: Unremarkable. No pancreatic ductal dilatation or surrounding inflammatory changes. Spleen: Normal in size without focal abnormality. Adrenals/Urinary Tract: Normal adrenal glands. 16 mm hypodense, fluid attenuating left upper pole renal mass consistent with a cyst. 3.3 cm hypodense, fluid attenuating left renal mass consistent with a cyst. Punctate nonobstructing left renal calculi. 10 mm nonobstructing right renal calculus. No obstructive uropathy. Normal bladder. Stomach/Bowel: Stomach is within normal limits. Appendix appears normal. No evidence of bowel wall thickening, distention, or inflammatory changes. Vascular/Lymphatic: Abdominal aortic atherosclerosis. No lymphadenopathy. Reproductive: Status post hysterectomy. No adnexal masses. Other: Fat containing left periumbilical hernia.  No ascites. Musculoskeletal: No acute osseous abnormality. No aggressive osseous lesion. Bilateral facet arthropathy at L4-5 and  L5-S1. IMPRESSION: 1. No acute abdominal or pelvic pathology. 2. Fat containing left periumbilical hernia. 3. Bilateral nonobstructing renal calculi. 4. Aortic Atherosclerosis (ICD10-I70.0). Electronically Signed   By: Elige Ko   On: 10/04/2020 14:00   DG Chest Portable 1 View  Result Date: 10/04/2020 CLINICAL DATA:  Chest pain. EXAM: PORTABLE CHEST 1 VIEW COMPARISON:  December 16, 2014. FINDINGS: The heart size and mediastinal contours are within normal limits. Both lungs are clear. No pneumothorax or pleural effusion is noted. The visualized skeletal structures are unremarkable. IMPRESSION: No active disease. Aortic Atherosclerosis (ICD10-I70.0). Electronically Signed   By: Lupita Raider M.D.   On: 10/04/2020 10:16    EKG: Independently reviewed.  Sinus tachycardia with rate 115; incomplete RBBB; NSCSLT   Labs on Admission: I have personally reviewed the available labs and imaging studies at the time of the admission.  Pertinent labs:   K+ 3.2 Glucose 133 HS troponin 4 Lactate 3.2 WBC 12.4 Hgb 11.0 INR 1.0 COVID/flu negative Normal UA   Assessment/Plan Principal Problem:   Severe sepsis with lactic acidosis (HCC) Active Problems:   Hypothyroidism   Dyslipidemia   OSA on CPAP   Obesity, Class III, BMI 40-49.9 (morbid obesity) (HCC)   Sepsis -Sepsis indicates life-threatening organ dysfunction with mortality >10%, caused by dysregulation to host response.   -SIRS criteria in this patient includes: Leukocytosis, fever, tachycardia, tachypnea  -Patient has evidence of acute organ failure with elevated lactate >2; recurrent hypotension (SBP < 90 or MAP < 65 x 2 readings) that is not easily explained by another condition. -While awaiting blood cultures, this appears to be a preseptic condition. -Sepsis protocol initiated -Worse outcomes are predicted from sepsis with 2 of the following: RR > 22; AMS, GCS < 13; or SBP <100.  This patient meets 2 of these criteria. -Patient  had initial lactate >4 or SBP <90/MAP <65 and so has received the 30 cc/kg IVF bolus. -Suspected source is unknown but with n/v/d and LUQ pain and negative CXR, abdominal CT, and UA will order stool studies and C diff -Blood and urine cultures pending -Will admit due to: hemodynamic instability -Treat with IV Flagyl/Cefepime/Vanc for undifferentiated sepsis -Will trend lactate to ensure improvement -Will order procalcitonin level.  Antibiotics would not be indicated for  PCT <0.1 and probably should not be used for < 0.25.  >0.5 indicates infection and >>0.5 indicates more serious disease.  As the procalcitonin level normalizes, it will be reasonable to consider de-escalation of antibiotic coverage. -This patient is at risk for shock and may require vasopressors to keep MAP >65 and/or due to lactate >2 despite volume resuscitation; shock is associated with >40% mortality.  Hypothyroidism -Check TSH -Continue Synthroid at current dose for now  HLD -Continue Lipitor  HTN -Hold HCTZ in the setting of shock physiology  OSA on CPAP -Continue CPAP  Obesity -BMI 40.55 -Weight loss should be encouraged -Outpatient PCP/bariatric medicine/bariatric surgery f/u encouraged    Note: This patient has been tested and is negative for the novel coronavirus COVID-19. She has been fully vaccinated against COVID-19.    DVT prophylaxis:  Lovenox Code Status:  Full - confirmed with patient/family Family Communication: Daughter present throughout evaluation  Disposition Plan:  The patient is from: home  Anticipated d/c is to: home without [facility] services   Anticipated d/c date will depend on clinical response to treatment, likely 2-3 days assuming ongoing clinical improvement  Patient is currently: acutely ill Consults called: None  Admission status: Admit - It is my clinical opinion that admission to INPATIENT is reasonable and necessary because of the expectation that this patient will require  hospital care that crosses at least 2 midnights to treat this condition based on the medical complexity of the problems presented.  Given the aforementioned information, the predictability of an adverse outcome is felt to be significant.     Jonah Blue MD [facility]   How to contact the [facility] Attending or Consulting provider 7A - 7P or covering provider during after hours 7P -7A, for this patient?  1. Check the care team in [facility] and look for a) attending/consulting [facility] provider listed and b) the [facility] team listed 2. Log into [email or web address] and use [facility]'s universal password to access. If you do not have the password, please contact the hospital operator. 3. Locate the [facility] provider you are looking for under [facility] and page to a number that you can be directly reached. 4. If you still have difficulty reaching the provider, please page the [facility] (Director on Call) for the Hospitalists listed on amion for assistance.   10/04/2020, 3:18 PM

## 2020-10-04 NOTE — Progress Notes (Signed)
Notified provider of need to order repeat lactic acid. Provider acknowledged.

## 2020-10-04 NOTE — ED Notes (Signed)
Patient transported to CT 

## 2020-10-04 NOTE — ED Provider Notes (Signed)
[place] EMERGENCY DEPARTMENT Provider Note  CSN: [id number] Arrival date & time: 10/04/20 [id number]    History Chief Complaint  Patient presents with  . Chest Pain  . Abdominal Pain  . Chills    HPI  [name] is a 59 y.o. female reports she was doing well yesterday but woke up this morning with chills and discomfort in her left chest and abdomen. She is unable to describe the pain. Reports some nausea and vomiting. No diarrhea. No dysuria or back pain. She did not know she was running a fever until arrival.  Past Medical History:  Diagnosis Date  . Asthma   . GERD (gastroesophageal reflux disease)   . Hyperlipemia 11/07/2011   . OSA on CPAP   . Thyroid disease     Past Surgical History:  Procedure Laterality Date  . ABDOMINAL HYSTERECTOMY    . CHOLECYSTECTOMY      Family History  Problem Relation Age of Onset  . Heart failure Mother   . Diabetes Mother     Social History   Tobacco Use  . Smoking status: Never Smoker  . Smokeless tobacco: Never Used  Substance Use Topics  . Alcohol use: No  . Drug use: No     Home Medications Prior to Admission medications   Medication Sig Start Date End Date Taking? Authorizing Provider  atorvastatin (LIPITOR) 40 MG tablet Take 40 mg by mouth at bedtime.    Yes [provider]  fluticasone (FLONASE) 50 MCG/ACT nasal spray Place 2 sprays into the nose daily as needed for allergies.    Yes [provider]  hydrochlorothiazide (HYDRODIURIL) 25 MG tablet Take 25 mg by mouth daily. 08/20/20  Yes [provider]  levothyroxine (SYNTHROID) 100 MCG tablet Take 100 mcg by mouth daily before breakfast. 08/20/20  Yes [provider]  omeprazole (PRILOSEC) 20 MG capsule Take 20 mg by mouth daily as needed.    Yes [provider]  albuterol (PROVENTIL HFA;VENTOLIN HFA) 108 (90 BASE) MCG/ACT inhaler Inhale 2 puffs into the lungs every 6 (six) hours as needed for wheezing or shortness  of breath.     [provider]  cyclobenzaprine (FLEXERIL) 5 MG tablet Take 1 tablet (5 mg total) by mouth 2 (two) times daily as needed for muscle spasms. 08/11/13   Cathlyn ParsonsKabbe, Angela M, NP  Diclofenac Sodium 3 % GEL Apply topically. 06/18/20   [provider]  ferrous sulfate 325 (65 FE) MG tablet Take 1 tablet (325 mg total) by mouth daily. Patient taking differently: Take 325 mg by mouth daily as needed.  12/16/14   Doug SouJacubowitz, Sam, MD  ibuprofen (ADVIL,MOTRIN) 600 MG tablet Take 1 tablet (600 mg total) by mouth 3 (three) times daily. 08/11/13   Cathlyn ParsonsKabbe, Angela M, NP  meloxicam (MOBIC) 7.5 MG tablet Take 7.5 mg by mouth daily. 06/18/20   [provider]  ondansetron (ZOFRAN) 8 MG tablet Take 1 tablet (8 mg total) by mouth every 8 (eight) hours as needed for nausea or vomiting. 01/21/17   Donnetta Hutchingook, Brian, MD  oxyCODONE-acetaminophen (PERCOCET/ROXICET) 5-325 MG tablet Take 1-2 tablets by mouth every 4 (four) hours as needed for severe pain. 01/21/17   Donnetta Hutchingook, Brian, MD  permethrin (ELIMITE) 5 % cream Apply to entire body other than face - let sit for 14 hours then wash off, may repeat in 1 week if still having symptoms 02/07/14   Roxy HorsemanBrowning, Robert, PA-C     Allergies    Patient has no  known allergies.   Review of Systems   Review of Systems Unable to assess due to acuity of condition  Physical Exam BP (!) 95/58   Pulse 86   Temp (!) 102.7 F (39.3 C) (Oral)   Resp (!) 22   Ht 4\' 9"  (1.448 m)   Wt 85 kg   SpO2 97%   BMI 40.55 kg/m   Physical Exam Vitals and nursing note reviewed.  Constitutional:      Appearance: Normal appearance.  HENT:     Head: Normocephalic and atraumatic.     Nose: Nose normal.     Mouth/Throat:     Mouth: Mucous membranes are moist.  Eyes:     Extraocular Movements: Extraocular movements intact.     Conjunctiva/sclera: Conjunctivae normal.  Cardiovascular:     Rate and Rhythm: Tachycardia present.  Pulmonary:     Effort: Pulmonary effort is  normal.     Breath sounds: Normal breath sounds.  Abdominal:     General: Abdomen is flat. There is no distension.     Palpations: Abdomen is soft.     Tenderness: There is abdominal tenderness (diffuse). There is no guarding.  Musculoskeletal:        General: No swelling. Normal range of motion.     Cervical back: Neck supple.  Skin:    General: Skin is warm and dry.  Neurological:     General: No focal deficit present.     Mental Status: She is alert.  Psychiatric:        Mood and Affect: Mood normal.      ED Results / Procedures / Treatments   Labs (all labs ordered are listed, but only abnormal results are displayed) Labs Reviewed  COMPREHENSIVE METABOLIC PANEL - Abnormal; Notable for the following components:      Result Value   Potassium 3.2 (*)    Glucose, Bld 133 (*)    Alkaline Phosphatase 31 (*)    All other components within normal limits  CBC - Abnormal; Notable for the following components:   WBC 12.4 (*)    Hemoglobin 11.0 (*)    MCV 77.9 (*)    MCH 23.8 (*)    All other components within normal limits  LACTIC ACID, PLASMA - Abnormal; Notable for the following components:   Lactic Acid, Venous 3.2 (*)    All other components within normal limits  LACTIC ACID, PLASMA - Abnormal; Notable for the following components:   Lactic Acid, Venous 2.2 (*)    All other components within normal limits  RESPIRATORY PANEL BY RT PCR (FLU A&B, COVID)  CULTURE, BLOOD (ROUTINE X 2)  CULTURE, BLOOD (ROUTINE X 2)  URINE CULTURE  LIPASE, BLOOD  URINALYSIS, ROUTINE W REFLEX MICROSCOPIC  PROTIME-INR  APTT  HIV ANTIBODY (ROUTINE TESTING W REFLEX)  LACTIC ACID, PLASMA  LACTIC ACID, PLASMA  PROCALCITONIN  TROPONIN I (HIGH SENSITIVITY)  TROPONIN I (HIGH SENSITIVITY)    EKG EKG Interpretation  Date/Time:  Monday October 04 2020 09:45:12 EDT Ventricular Rate:  115 PR Interval:    QRS Duration: 68 QT Interval:  434 QTC Calculation: 600 R Axis:   24 Text  Interpretation: Sinus tachycardia Cannot rule out Anterior infarct , age undetermined QT not prolonged, T waves are flat and machine seems to be counting P waves Abnormal ECG No significant change since last tracing Confirmed by [id number] [phone]) on 10/04/2020 9:58:17 AM   Radiology CT ABDOMEN PELVIS W CONTRAST  Result Date: 10/04/2020 CLINICAL DATA:  Nausea, vomiting, diffuse abdominal pain. EXAM: CT ABDOMEN AND PELVIS WITH CONTRAST TECHNIQUE: Multidetector CT imaging of the abdomen and pelvis was performed using the standard protocol following bolus administration of intravenous contrast. CONTRAST:  OMNIPAQUE IOHEXOL 300 MG/ML  SOLN COMPARISON:  01/21/2017 FINDINGS: Lower chest: No acute abnormality. Hepatobiliary: No focal liver abnormality is seen. Status post cholecystectomy. No biliary dilatation. Pancreas: Unremarkable. No pancreatic ductal dilatation or surrounding inflammatory changes. Spleen: Normal in size without focal abnormality. Adrenals/Urinary Tract: Normal adrenal glands. 16 mm hypodense, fluid attenuating left upper pole renal mass consistent with a cyst. 3.3 cm hypodense, fluid attenuating left renal mass consistent with a cyst. Punctate nonobstructing left renal calculi. 10 mm nonobstructing right renal calculus. No obstructive uropathy. Normal bladder. Stomach/Bowel: Stomach is within normal limits. Appendix appears normal. No evidence of bowel wall thickening, distention, or inflammatory changes. Vascular/Lymphatic: Abdominal aortic atherosclerosis. No lymphadenopathy. Reproductive: Status post hysterectomy. No adnexal masses. Other: Fat containing left periumbilical hernia.  No ascites. Musculoskeletal: No acute osseous abnormality. No aggressive osseous lesion. Bilateral facet arthropathy at L4-5 and L5-S1. IMPRESSION: 1. No acute abdominal or pelvic pathology. 2. Fat containing left periumbilical hernia. 3. Bilateral nonobstructing renal calculi. 4. Aortic  Atherosclerosis (ICD10-I70.0). Electronically Signed   By: Elige Ko   On: 10/04/2020 14:00   DG Chest Portable 1 View  Result Date: 10/04/2020 CLINICAL DATA:  Chest pain. EXAM: PORTABLE CHEST 1 VIEW COMPARISON:  December 16, 2014. FINDINGS: The heart size and mediastinal contours are within normal limits. Both lungs are clear. No pneumothorax or pleural effusion is noted. The visualized skeletal structures are unremarkable. IMPRESSION: No active disease. Aortic Atherosclerosis (ICD10-I70.0). Electronically Signed   By: Lupita Raider M.D.   On: 10/04/2020 10:16    Procedures Procedures  Medications Ordered in the ED Medications  lactated ringers infusion (has no administration in time range)  vancomycin (VANCOCIN) IVPB 1000 mg/200 mL premix (has no administration in time range)  ceFEPIme (MAXIPIME) 2 g in sodium chloride 0.9 % 100 mL IVPB (has no administration in time range)  metroNIDAZOLE (FLAGYL) IVPB 500 mg (has no administration in time range)  enoxaparin (LOVENOX) injection 40 mg (has no administration in time range)  sodium chloride flush (NS) 0.9 % injection 3 mL (has no administration in time range)  acetaminophen (TYLENOL) tablet 650 mg (has no administration in time range)    Or  acetaminophen (TYLENOL) suppository 650 mg (has no administration in time range)  morphine 2 MG/ML injection 2 mg (has no administration in time range)  ondansetron (ZOFRAN) tablet 4 mg (has no administration in time range)    Or  ondansetron (ZOFRAN) injection 4 mg (has no administration in time range)  lactated ringers bolus 1,000 mL (0 mLs Intravenous Stopped 10/04/20 1344)    And  lactated ringers bolus 1,000 mL (1,000 mLs Intravenous New Bag/Given 10/04/20 1205)    And  lactated ringers bolus 1,000 mL (1,000 mLs Intravenous New Bag/Given 10/04/20 1214)  ceFEPIme (MAXIPIME) 2 g in sodium chloride 0.9 % 100 mL IVPB (0 g Intravenous Stopped 10/04/20 1141)  metroNIDAZOLE (FLAGYL) IVPB 500 mg  (0 mg Intravenous Stopped 10/04/20 1310)  vancomycin (VANCOCIN) IVPB 1000 mg/200 mL premix (0 mg Intravenous Stopped 10/04/20 1412)  iohexol (OMNIPAQUE) 300 MG/ML solution 100 mL (100 mLs Intravenous Contrast Given 10/04/20 1330)  acetaminophen (TYLENOL) tablet 650 mg (650 mg Oral Given 10/04/20 1353)     MDM Rules/Calculators/A&P MDM Patient's initial triage vitals concerning for septic shock with fever, tachycardia and  hypotension. The sepsis protocol was initiated including LR bolus and broad spectrum Abx. BP on arrival to ED exam room was improved. Will check labs and imaging to determine source. Exam is equivocal for intra-abdominal source.  ED Course  I have reviewed the triage vital signs and the nursing notes.  Pertinent labs & imaging results that were available during my care of the patient were reviewed by me and considered in my medical decision making (see chart for details).  Clinical Course as of Oct 04 1445  Mon Oct 04, 2020  1032 CXR is neg for source of infection. Will check CT Abd/Pel to eval intra-abdominal source.    [CS]  1033 CBC with mild leukocytosis and mild anemia.    [CS]  1058 Lactic acid is elevated, will recheck after fluid bolus.   [CS]  1115 CMP, Coags, Trop and lipase unremarkable.    [CS]  1121 Covid/Flu neg.    [CS]  1346 CT reviewed, no obvious source by my interpretation. UA collected and pending. Will discuss admission with Hospitalist.   [CS]  1403 CT and UA both neg for source.    [CS]  1414 Spoke with Dr. Ophelia Charter, Hospitalist, who will evaluate for admission.    [CS]  1445 Lactic acid improving.    [CS]    Clinical Course User Index [CS] Pollyann Savoy, MD    Final Clinical Impression(s) / ED Diagnoses Final diagnoses:  Fever, unknown origin  Sepsis without acute organ dysfunction, due to unspecified organism [facility])    Rx / DC Orders ED Discharge Orders    None       Pollyann Savoy, MD 10/04/20 1446

## 2020-10-04 NOTE — ED Triage Notes (Signed)
Pt from home with ems for chest pain, abd pain and chills, no fever.  Pt a.o, tearful in triage.  HR 106 100% on room air CBG 134 BP 126/90

## 2020-10-04 NOTE — Progress Notes (Signed)
Pt holding in the ED for a progressive care bed and seen on f/u rounds. No acute issues at this time. Care discussed with RN. Please contact me if there are any questions or concerns.  

## 2020-10-04 NOTE — Progress Notes (Signed)
Pharmacy Antibiotic Note  [name] is a 59 y.o. female admitted on 10/04/2020 with sepsis.  Pharmacy has been consulted for Vanc and cefepime dosing.  Plan: Vancomycin 1000 mg IV q12hr Cefepime 2 gms IV q8hr Monitor renal function, clinical status, C&S, and vanc levels as needed  Height: 4\' 9"  (144.8 cm) Weight: 85 kg (187 lb 6.3 oz) IBW/kg (Calculated) : 38.6  Temp (24hrs), Avg:102 F (38.9 C), Min:101.2 F (38.4 C), Max:102.7 F (39.3 C)  Recent Labs  Lab 10/04/20 0953  WBC 12.4*  CREATININE 0.87  LATICACIDVEN 3.2*    Estimated Creatinine Clearance: 62.9 mL/min (by C-G formula based on SCr of 0.87 mg/dL).    No Known Allergies  Antimicrobials this admission: Vanc 10/18 >>  Cefep 10/18 >>   Thank you for allowing pharmacy to be a part of this patient's care.  11/18, PharmD, [facility] Clinical Pharmacist Please see AMION for all Pharmacists' Contact Phone Numbers 10/04/2020, 11:39 AM

## 2020-10-04 NOTE — ED Notes (Signed)
Pt vomiting in triage 

## 2020-10-04 NOTE — ED Notes (Signed)
Lactic acid 3.2 md notified.

## 2020-10-05 DIAGNOSIS — G4733 Obstructive sleep apnea (adult) (pediatric): Secondary | ICD-10-CM

## 2020-10-05 DIAGNOSIS — E039 Hypothyroidism, unspecified: Secondary | ICD-10-CM

## 2020-10-05 DIAGNOSIS — A419 Sepsis, unspecified organism: Secondary | ICD-10-CM

## 2020-10-05 DIAGNOSIS — Z9989 Dependence on other enabling machines and devices: Secondary | ICD-10-CM

## 2020-10-05 DIAGNOSIS — E785 Hyperlipidemia, unspecified: Secondary | ICD-10-CM

## 2020-10-05 LAB — GASTROINTESTINAL PANEL BY PCR, STOOL (REPLACES STOOL CULTURE)

## 2020-10-05 LAB — MRSA PCR SCREENING: MRSA by PCR: NEGATIVE

## 2020-10-05 LAB — BASIC METABOLIC PANEL
Anion gap: 10 (ref 5–15)
BUN: 10 mg/dL (ref 6–20)
CO2: 24 mmol/L (ref 22–32)
Calcium: 9.1 mg/dL (ref 8.9–10.3)
Chloride: 106 mmol/L (ref 98–111)
Creatinine, Ser: 0.71 mg/dL (ref 0.44–1.00)
GFR, Estimated: >60 mL/min (ref >60)
Glucose, Bld: 93 mg/dL (ref 70–99)
Potassium: 3.3 mmol/L — ABNORMAL LOW (ref 3.5–5.1)
Sodium: 140 mmol/L (ref 135–145)

## 2020-10-05 LAB — CBC WITH DIFFERENTIAL/PLATELET
Abs Immature Granulocytes: 0.05 10*3/uL (ref 0.00–0.07)
Basophils Absolute: 0 10*3/uL (ref 0.0–0.1)
Basophils Relative: 0 %
Eosinophils Absolute: 0.1 10*3/uL (ref 0.0–0.5)
Eosinophils Relative: 1 %
HCT: 28.8 % — ABNORMAL LOW (ref 36.0–46.0)
Hemoglobin: 8.7 g/dL — ABNORMAL LOW (ref 12.0–15.0)
Immature Granulocytes: 1 %
Lymphocytes Relative: 21 %
Lymphs Abs: 2.2 10*3/uL (ref 0.7–4.0)
MCH: 23.5 pg — ABNORMAL LOW (ref 26.0–34.0)
MCHC: 30.2 g/dL (ref 30.0–36.0)
MCV: 77.8 fL — ABNORMAL LOW (ref 80.0–100.0)
Monocytes Absolute: 0.5 10*3/uL (ref 0.1–1.0)
Monocytes Relative: 4 %
Neutro Abs: 7.8 10*3/uL — ABNORMAL HIGH (ref 1.7–7.7)
Neutrophils Relative %: 73 %
Platelets: 260 10*3/uL (ref 150–400)
RBC: 3.7 MIL/uL — ABNORMAL LOW (ref 3.87–5.11)
RDW: 14.1 % (ref 11.5–15.5)
WBC: 10.6 10*3/uL — ABNORMAL HIGH (ref 4.0–10.5)
nRBC: 0 % (ref 0.0–0.2)

## 2020-10-05 LAB — TROPONIN I (HIGH SENSITIVITY)
Troponin I (High Sensitivity): 5 ng/L (ref <18)
Troponin I (High Sensitivity): 6 ng/L (ref <18)

## 2020-10-05 LAB — IRON AND TIBC
Iron: 59 ug/dL (ref 28–170)
Saturation Ratios: 31 % (ref 10.4–31.8)
TIBC: 193 ug/dL — ABNORMAL LOW (ref 250–450)
UIBC: 134 ug/dL

## 2020-10-05 LAB — URINE CULTURE: Culture: NO GROWTH

## 2020-10-05 LAB — CBC
HCT: 30.3 % — ABNORMAL LOW (ref 36.0–46.0)
Hemoglobin: 9.4 g/dL — ABNORMAL LOW (ref 12.0–15.0)
MCH: 24.2 pg — ABNORMAL LOW (ref 26.0–34.0)
MCHC: 31 g/dL (ref 30.0–36.0)
MCV: 78.1 fL — ABNORMAL LOW (ref 80.0–100.0)
Platelets: 275 10*3/uL (ref 150–400)
RBC: 3.88 MIL/uL (ref 3.87–5.11)
RDW: 14.2 % (ref 11.5–15.5)
WBC: 11.4 10*3/uL — ABNORMAL HIGH (ref 4.0–10.5)
nRBC: 0 % (ref 0.0–0.2)

## 2020-10-05 LAB — TSH: TSH: 0.092 u[IU]/mL — ABNORMAL LOW (ref 0.350–4.500)

## 2020-10-05 LAB — TYPE AND SCREEN
ABO/RH(D): O POS
Antibody Screen: NEGATIVE

## 2020-10-05 LAB — VITAMIN B12: Vitamin B-12: 242 pg/mL (ref 180–914)

## 2020-10-05 LAB — C DIFFICILE QUICK SCREEN W PCR REFLEX
C Diff antigen: NEGATIVE
C Diff interpretation: NOT DETECTED
C Diff toxin: NEGATIVE

## 2020-10-05 LAB — FERRITIN: Ferritin: 554 ng/mL — ABNORMAL HIGH (ref 11–307)

## 2020-10-05 LAB — T4, FREE: Free T4: 1.3 ng/dL — ABNORMAL HIGH (ref 0.61–1.12)

## 2020-10-05 LAB — ABO/RH: ABO/RH(D): O POS

## 2020-10-05 LAB — FOLATE: Folate: 14.9 ng/mL (ref >5.9)

## 2020-10-05 LAB — LACTIC ACID, PLASMA: Lactic Acid, Venous: 0.8 mmol/L (ref 0.5–1.9)

## 2020-10-05 LAB — PROCALCITONIN: Procalcitonin: 7.99 ng/mL

## 2020-10-05 MED ORDER — LACTATED RINGERS IV SOLN: INTRAVENOUS | Status: DC

## 2020-10-05 MED ORDER — PROMETHAZINE HCL 25 MG/ML IJ SOLN
12.5000 mg | Freq: Four times a day (QID) | INTRAMUSCULAR | Status: DC | PRN
  Filled 2020-10-05: qty 1

## 2020-10-05 MED ORDER — PANTOPRAZOLE SODIUM 40 MG IV SOLR
40.0000 mg | Freq: Every day | INTRAVENOUS | Status: DC
  Administered 2020-10-05 – 2020-10-07 (×3): 40 mg via INTRAVENOUS
  Filled 2020-10-05 (×3): qty 40

## 2020-10-05 MED ORDER — POTASSIUM CHLORIDE CRYS ER 20 MEQ PO TBCR
40.0000 meq | EXTENDED_RELEASE_TABLET | Freq: Once | ORAL | Status: DC
  Filled 2020-10-05: qty 2

## 2020-10-05 MED ORDER — SODIUM CHLORIDE 0.9 % IV SOLN: INTRAVENOUS | Status: DC

## 2020-10-05 NOTE — Plan of Care (Signed)
?  Problem: Education: ?Goal: Knowledge of General Education information will improve ?Description: Including pain rating scale, medication(s)/side effects and non-pharmacologic comfort measures ?Outcome: Progressing ?  ?Problem: Health Behavior/Discharge Planning: ?Goal: Ability to manage health-related needs will improve ?Outcome: Progressing ?  ?Problem: Clinical Measurements: ?Goal: Ability to maintain clinical measurements within normal limits will improve ?Outcome: Progressing ?Goal: Will remain free from infection ?Outcome: Progressing ?Goal: Diagnostic test results will improve ?Outcome: Progressing ?Goal: Respiratory complications will improve ?Outcome: Progressing ?Goal: Cardiovascular complication will be avoided ?Outcome: Progressing ?  ?Problem: Coping: ?Goal: Level of anxiety will decrease ?Outcome: Progressing ?  ?Problem: Pain Managment: ?Goal: General experience of comfort will improve ?Outcome: Progressing ?  ?Problem: Safety: ?Goal: Ability to remain free from injury will improve ?Outcome: Progressing ?  ?Problem: Skin Integrity: ?Goal: Risk for impaired skin integrity will decrease ?Outcome: Progressing ?  ?

## 2020-10-05 NOTE — Progress Notes (Signed)
PROGRESS NOTE  THU [name] XNT:[id number] DOB: 1961/05/10 DOA: 10/04/2020 PCP: System, Provider Not In  HPI/Recap of past 24 hours: HPI from Dr Bary Leriche is a 59 y.o. female with medical history significant of hypothyroidism; HLD; and OSA on CPAP presenting with chest/abdominal pain. Pt was fine the day before, woke up to pain and vomiting. She complained of chills.  She has complained of abdominal pain and R arm pain.  Also with a headache.  Also with chest pain.  +n/v/d all morning, 3-4 episodes.  No recent antibiotics.  No sick contacts.  +fever here, not noted at home.  Mild SOB on arrival, but not now and no cough.  She is complaining of acute R arm pain.  Her last knee injection was maybe 2 months ago.  She has a chronic "bone problem" for which she is on disability - if she walks too long her knees and legs give out.  She has been on disability since she was a teenager. In the ED, BP soft, lactate elevated. Given broad spectrum antibiotics and fluid bolus.  UA and CT unremarkable.  Patient admitted for further management.     Today, patient complaining of epigastric/substernal chest pain, still nauseous, but denies any vomiting, no further diarrhea noted, noted some shortness of breath, no further fever/chills.  Patient appears acutely ill.   Assessment/Plan: Principal Problem:   Severe sepsis with lactic acidosis (HCC) Active Problems:   Hypothyroidism   Dyslipidemia   OSA on CPAP   Obesity, Class III, BMI 40-49.9 (morbid obesity) (HCC)   Sepsis On admission, noted to be febrile, tachycardic, tachypneic, with leukocytosis Unknown etiology/no obvious source found Lactic acidosis 2.2-0.8 Procalcitonin elevated at 6.67-7.99 UA negative, UC pending BC x2 pending Chest x-ray unremarkable CT abdomen/pelvis unremarkable Continue broad-spectrum antibiotics with vancomycin, cefepime, metronidazole and plan to de-escalate once source found or clinical  improvement Continue IV fluids as BP remains soft Monitor closely  Hypokalemia Replace as needed  Microcytic anemia Hemoglobin baseline around 10-11 from 2018 Noted drop in hemoglobin, likely dilutional, but also c/o epigastric tenderness No obvious signs of bleeding FOBT pending Type and screen, anemia panel pending Monitor CBC closely  Hypertension BP currently soft Hold home hydrochlorothiazide Continue IV fluids  GERD Start IV Protonix  Hypothyroidism TSH, free T4 pending Continue Synthroid  Hyperlipidemia Continue Lipitor  OSA on CPAP Continue CPAP  Morbid obesity Lifestyle modification advised      Malnutrition Type:      Malnutrition Characteristics:      Nutrition Interventions:       Estimated body mass index is 40.55 kg/m as calculated from the following:   Height as of this encounter: 4\' 9"  (1.448 m).   Weight as of this encounter: 85 kg.     Code Status: Full  Family Communication: None at bedside  Disposition Plan: Status is: Inpatient  Remains inpatient appropriate because:Inpatient level of care appropriate due to severity of illness   Dispo: The patient is from: Home              Anticipated d/c is to: Home              Anticipated d/c date is: 2 days              Patient currently is not medically stable to d/c.    Consultants:  None  Procedures:  None  Antimicrobials:  Cefepime  Vancomycin  Flagyl  DVT prophylaxis: Lovenox   Objective: Vitals:  10/05/20 0500 10/05/20 0530 10/05/20 0600 10/05/20 0630  BP: (!) 108/59 112/73 (!) 114/55 112/69  Pulse: 62 (!) 58 (!) 53 70  Resp: (!) 21 19 (!) 22 20  Temp:      TempSrc:      SpO2: 95% 96% 94% 99%  Weight:      Height:        Intake/Output Summary (Last 24 hours) at 10/05/2020 1157 Last data filed at 10/05/2020 0641 Gross per 24 hour  Intake 1600 ml  Output 950 ml  Net 650 ml   Filed Weights   10/04/20 1027  Weight: 85 kg    Exam:   General: NAD, acute-ill appearing  Cardiovascular: S1, S2 present  Respiratory: CTAB  Abdomen: Soft, nontender, nondistended, bowel sounds present  Musculoskeletal: No bilateral pedal edema noted  Skin: Normal  Psychiatry: Normal mood   Data Reviewed: CBC: Recent Labs  Lab 10/04/20 0953 10/05/20 0750  WBC 12.4* 10.6*  NEUTROABS  --  7.8*  HGB 11.0* 8.7*  HCT 36.0 28.8*  MCV 77.9* 77.8*  PLT 325 260   Basic Metabolic Panel: Recent Labs  Lab 10/04/20 0953 10/05/20 0211  NA 139 140  K 3.2* 3.3*  CL 103 106  CO2 24 24  GLUCOSE 133* 93  BUN 12 10  CREATININE 0.87 0.71  CALCIUM 9.9 9.1   GFR: Estimated Creatinine Clearance: 68.4 mL/min (by C-G formula based on SCr of 0.71 mg/dL). Liver Function Tests: Recent Labs  Lab 10/04/20 0953  AST 28  ALT 30  ALKPHOS 31*  BILITOT 0.7  PROT 7.6  ALBUMIN 3.6   Recent Labs  Lab 10/04/20 0953  LIPASE 24   No results for input(s): AMMONIA in the last 168 hours. Coagulation Profile: Recent Labs  Lab 10/04/20 1036  INR 1.0   Cardiac Enzymes: No results for input(s): CKTOTAL, CKMB, CKMBINDEX, TROPONINI in the last 168 hours. BNP (last 3 results) No results for input(s): PROBNP in the last 8760 hours. HbA1C: No results for input(s): HGBA1C in the last 72 hours. CBG: No results for input(s): GLUCAP in the last 168 hours. Lipid Profile: No results for input(s): CHOL, HDL, LDLCALC, TRIG, CHOLHDL, LDLDIRECT in the last 72 hours. Thyroid Function Tests: No results for input(s): TSH, T4TOTAL, FREET4, T3FREE, THYROIDAB in the last 72 hours. Anemia Panel: No results for input(s): VITAMINB12, FOLATE, FERRITIN, TIBC, IRON, RETICCTPCT in the last 72 hours. Urine analysis:    Component Value Date/Time   COLORURINE YELLOW 10/04/2020 0945   APPEARANCEUR CLEAR 10/04/2020 0945   LABSPEC 1.018 10/04/2020 0945   PHURINE 8.0 10/04/2020 0945   GLUCOSEU NEGATIVE 10/04/2020 0945   HGBUR NEGATIVE 10/04/2020 0945   BILIRUBINUR  NEGATIVE 10/04/2020 0945   KETONESUR NEGATIVE 10/04/2020 0945   PROTEINUR NEGATIVE 10/04/2020 0945   UROBILINOGEN 1.0 11/07/2011 2206   NITRITE NEGATIVE 10/04/2020 0945   LEUKOCYTESUR NEGATIVE 10/04/2020 0945   Sepsis Labs: @LABRCNTIP (procalcitonin:4,lacticidven:4)  ) Recent Results (from the past 240 hour(s))  Respiratory Panel by RT PCR (Flu A&B, Covid) - Nasopharyngeal Swab     Status: None   Collection Time: 10/04/20 10:14 AM   Specimen: Nasopharyngeal Swab  Result Value Ref Range Status   SARS Coronavirus 2 by RT PCR NEGATIVE NEGATIVE Final    Comment: (NOTE) SARS-CoV-2 target nucleic acids are NOT DETECTED.  The SARS-CoV-2 RNA is generally detectable in upper respiratoy specimens during the acute phase of infection. The lowest concentration of SARS-CoV-2 viral copies this assay can detect is 131 copies/mL. A negative  result does not preclude SARS-Cov-2 infection and should not be used as the sole basis for treatment or other patient management decisions. A negative result may occur with  improper specimen collection/handling, submission of specimen other than nasopharyngeal swab, presence of viral mutation(s) within the areas targeted by this assay, and inadequate number of viral copies (<131 copies/mL). A negative result must be combined with clinical observations, patient history, and epidemiological information. The expected result is Negative.  Fact Sheet for Patients:  https://www.moore.com/  Fact Sheet for Healthcare Providers:  https://www.young.biz/  This test is no t yet approved or cleared by the [place] FDA and  has been authorized for detection and/or diagnosis of SARS-CoV-2 by FDA under an Emergency Use Authorization (EUA). This EUA will remain  in effect (meaning this test can be used) for the duration of the COVID-19 declaration under Section 564(b)(1) of the Act, 21 U.S.C. section 360bbb-3(b)(1), unless the  authorization is terminated or revoked sooner.     Influenza A by PCR NEGATIVE NEGATIVE Final   Influenza B by PCR NEGATIVE NEGATIVE Final    Comment: (NOTE) The Xpert Xpress SARS-CoV-2/FLU/RSV assay is intended as an aid in  the diagnosis of influenza from Nasopharyngeal swab specimens and  should not be used as a sole basis for treatment. Nasal washings and  aspirates are unacceptable for Xpert Xpress SARS-CoV-2/FLU/RSV  testing.  Fact Sheet for Patients: https://www.moore.com/  Fact Sheet for Healthcare Providers: https://www.young.biz/  This test is not yet approved or cleared by the [place] FDA and  has been authorized for detection and/or diagnosis of SARS-CoV-2 by  FDA under an Emergency Use Authorization (EUA). This EUA will remain  in effect (meaning this test can be used) for the duration of the  Covid-19 declaration under Section 564(b)(1) of the Act, 21  U.S.C. section 360bbb-3(b)(1), unless the authorization is  terminated or revoked. Performed at [facility], [address] [place], [place] [address]   Blood Culture (routine x 2)     Status: None (Preliminary result)   Collection Time: 10/04/20 10:36 AM   Specimen: BLOOD  Result Value Ref Range Status   Specimen Description BLOOD RIGHT ANTECUBITAL  Final   Special Requests   Final    BOTTLES DRAWN AEROBIC AND ANAEROBIC Blood Culture adequate volume   Culture   Final    NO GROWTH < 24 HOURS Performed at [facility], [address] [place], [place] [address]    Report Status PENDING  Incomplete  Urine culture     Status: None   Collection Time: 10/04/20  1:44 PM   Specimen: In/Out Cath Urine  Result Value Ref Range Status   Specimen Description IN/OUT CATH URINE  Final   Special Requests NONE  Final   Culture   Final    NO GROWTH Performed at [facility], [address], [place], [place] [address]    Report Status 10/05/2020 FINAL  Final   Blood Culture (routine x 2)     Status: None (Preliminary result)   Collection Time: 10/04/20  2:00 PM   Specimen: BLOOD RIGHT ARM  Result Value Ref Range Status   Specimen Description BLOOD RIGHT ARM  Final   Special Requests   Final    BOTTLES DRAWN AEROBIC ONLY Blood Culture adequate volume   Culture   Final    NO GROWTH < 24 HOURS Performed at [facility], [address], [place], [place] [address]  Report Status PENDING  Incomplete  C Difficile Quick Screen w PCR reflex     Status: None   Collection Time: 10/05/20  2:44 AM   Specimen: Stool  Result Value Ref Range Status   C Diff antigen NEGATIVE NEGATIVE Final   C Diff toxin NEGATIVE NEGATIVE Final   C Diff interpretation No C. difficile detected.  Final    Comment: Performed at [facility], [address], [place], [place] [address]      Studies: CT ABDOMEN PELVIS W CONTRAST  Result Date: 10/04/2020 CLINICAL DATA:  Nausea, vomiting, diffuse abdominal pain. EXAM: CT ABDOMEN AND PELVIS WITH CONTRAST TECHNIQUE: Multidetector CT imaging of the abdomen and pelvis was performed using the standard protocol following bolus administration of intravenous contrast. CONTRAST:  OMNIPAQUE IOHEXOL 300 MG/ML  SOLN COMPARISON:  01/21/2017 FINDINGS: Lower chest: No acute abnormality. Hepatobiliary: No focal liver abnormality is seen. Status post cholecystectomy. No biliary dilatation. Pancreas: Unremarkable. No pancreatic ductal dilatation or surrounding inflammatory changes. Spleen: Normal in size without focal abnormality. Adrenals/Urinary Tract: Normal adrenal glands. 16 mm hypodense, fluid attenuating left upper pole renal mass consistent with a cyst. 3.3 cm hypodense, fluid attenuating left renal mass consistent with a cyst. Punctate nonobstructing left renal calculi. 10 mm nonobstructing right renal calculus. No obstructive uropathy. Normal bladder. Stomach/Bowel: Stomach is within normal limits. Appendix appears normal.  No evidence of bowel wall thickening, distention, or inflammatory changes. Vascular/Lymphatic: Abdominal aortic atherosclerosis. No lymphadenopathy. Reproductive: Status post hysterectomy. No adnexal masses. Other: Fat containing left periumbilical hernia.  No ascites. Musculoskeletal: No acute osseous abnormality. No aggressive osseous lesion. Bilateral facet arthropathy at L4-5 and L5-S1. IMPRESSION: 1. No acute abdominal or pelvic pathology. 2. Fat containing left periumbilical hernia. 3. Bilateral nonobstructing renal calculi. 4. Aortic Atherosclerosis (ICD10-I70.0). Electronically Signed   By: Elige Ko   On: 10/04/2020 14:00    Scheduled Meds: . atorvastatin  40 mg Oral QHS  . enoxaparin (LOVENOX) injection  40 mg Subcutaneous Q24H  . levothyroxine  100 mcg Oral QAC breakfast  . pantoprazole (PROTONIX) IV  40 mg Intravenous Daily  . potassium chloride  40 mEq Oral Once  . sodium chloride flush  3 mL Intravenous Q12H    Continuous Infusions: . ceFEPime (MAXIPIME) IV Stopped (10/05/20 0641)  . lactated ringers    . metronidazole 500 mg (10/05/20 1037)  . vancomycin Stopped (10/05/20 0152)     LOS: 1 day     Briant Cedar, MD [facility]  If 7PM-7AM, please contact night-coverage [email or web address] 10/05/2020, 11:57 AM

## 2020-10-05 NOTE — Progress Notes (Signed)
Patient placed on CPAP for tonight.  

## 2020-10-05 NOTE — ED Notes (Signed)
Spoke with Dr. Loney Loh Re: patients c/o chest pain during rounds at 2330. EKG obtained. Patient denies pain at present. New orders received to obtain stat trop. Will continue to monitor.

## 2020-10-05 NOTE — ED Notes (Signed)
Patient denies pain and is resting comfortably.  

## 2020-10-06 DIAGNOSIS — K219 Gastro-esophageal reflux disease without esophagitis: Secondary | ICD-10-CM

## 2020-10-06 DIAGNOSIS — D649 Anemia, unspecified: Secondary | ICD-10-CM

## 2020-10-06 LAB — BASIC METABOLIC PANEL
Anion gap: 11 (ref 5–15)
BUN: 9 mg/dL (ref 6–20)
CO2: 21 mmol/L — ABNORMAL LOW (ref 22–32)
Calcium: 8.9 mg/dL (ref 8.9–10.3)
Chloride: 108 mmol/L (ref 98–111)
Creatinine, Ser: 0.76 mg/dL (ref 0.44–1.00)
GFR, Estimated: >60 mL/min (ref >60)
Glucose, Bld: 84 mg/dL (ref 70–99)
Potassium: 3.4 mmol/L — ABNORMAL LOW (ref 3.5–5.1)
Sodium: 140 mmol/L (ref 135–145)

## 2020-10-06 LAB — CBC WITH DIFFERENTIAL/PLATELET
Abs Immature Granulocytes: 0.03 10*3/uL (ref 0.00–0.07)
Basophils Absolute: 0 10*3/uL (ref 0.0–0.1)
Basophils Relative: 0 %
Eosinophils Absolute: 0.2 10*3/uL (ref 0.0–0.5)
Eosinophils Relative: 2 %
HCT: 29.7 % — ABNORMAL LOW (ref 36.0–46.0)
Hemoglobin: 9 g/dL — ABNORMAL LOW (ref 12.0–15.0)
Immature Granulocytes: 0 %
Lymphocytes Relative: 24 %
Lymphs Abs: 2.4 10*3/uL (ref 0.7–4.0)
MCH: 23.4 pg — ABNORMAL LOW (ref 26.0–34.0)
MCHC: 30.3 g/dL (ref 30.0–36.0)
MCV: 77.1 fL — ABNORMAL LOW (ref 80.0–100.0)
Monocytes Absolute: 0.5 10*3/uL (ref 0.1–1.0)
Monocytes Relative: 5 %
Neutro Abs: 6.6 10*3/uL (ref 1.7–7.7)
Neutrophils Relative %: 69 %
Platelets: 261 10*3/uL (ref 150–400)
RBC: 3.85 MIL/uL — ABNORMAL LOW (ref 3.87–5.11)
RDW: 14.1 % (ref 11.5–15.5)
WBC: 9.7 10*3/uL (ref 4.0–10.5)
nRBC: 0 % (ref 0.0–0.2)

## 2020-10-06 LAB — RAPID URINE DRUG SCREEN, HOSP PERFORMED
Amphetamines: NOT DETECTED
Barbiturates: NOT DETECTED
Benzodiazepines: NOT DETECTED
Cocaine: NOT DETECTED
Opiates: NOT DETECTED
Tetrahydrocannabinol: NOT DETECTED

## 2020-10-06 LAB — PROCALCITONIN: Procalcitonin: 5.25 ng/mL

## 2020-10-06 MED ORDER — POTASSIUM CHLORIDE 10 MEQ/100ML IV SOLN
10.0000 meq | INTRAVENOUS | Status: AC
  Administered 2020-10-06 (×2): 10 meq via INTRAVENOUS
  Filled 2020-10-06 (×2): qty 100

## 2020-10-06 MED ORDER — LEVOTHYROXINE SODIUM 50 MCG PO TABS
50.0000 ug | ORAL_TABLET | Freq: Every day | ORAL | Status: DC
  Administered 2020-10-07 – 2020-10-08 (×2): 50 ug via ORAL
  Filled 2020-10-06 (×2): qty 1

## 2020-10-06 MED ORDER — SUCRALFATE 1 GM/10ML PO SUSP
1.0000 g | Freq: Three times a day (TID) | ORAL | Status: DC
  Administered 2020-10-06 – 2020-10-08 (×8): 1 g via ORAL
  Filled 2020-10-06 (×7): qty 10

## 2020-10-06 NOTE — Progress Notes (Signed)
   10/06/20 0946  Vitals  BP (!) 151/72  MAP (mmHg) 94  Pulse Rate 72  ECG Heart Rate 73  Resp 20  Oxygen Therapy  SpO2 97 %  MEWS Score  MEWS Temp 0  MEWS Systolic 0  MEWS Pulse 0  MEWS RR 0  MEWS LOC 0  MEWS Score 0  MEWS Score Color Green  called to room pt c/o chest pain, no shortness of breath, appears NSR on monitor. Notified Dr. Benjamine Mola, 12 lead EKG ordered. Will continue to monitor

## 2020-10-06 NOTE — Progress Notes (Signed)
Pt c/o Nausea, zofran given with relief and carafate also given. Pt states that pain in her chest is not there at this time.

## 2020-10-06 NOTE — Plan of Care (Signed)

## 2020-10-06 NOTE — Progress Notes (Signed)
Pt placed on cpap 

## 2020-10-06 NOTE — Progress Notes (Signed)
Progress Note    [name]  XVQ:[id number] DOB: 1961/06/06  DOA: 10/04/2020 PCP: System, Provider Not In    Brief Narrative:    Medical records reviewed and are as summarized below:  [name] is an 59 y.o. female complaining of epigastric/substernal chest pain  Assessment/Plan:   Active Problems:   Hypothyroidism   Dyslipidemia   OSA on CPAP   Obesity, Class III, BMI 40-49.9 (morbid obesity) (HCC)   GERD (gastroesophageal reflux disease)   Anemia   No source of infection so not convinced patient was septic -GI pathogen panel/c diff negative -no sign of PNA -will de-escalate abx in 24-48 hours unless source become apparent  Abdominal pain with N/V -?GI ulcer -CT scan negative of abd/pelvis -added PPI and Carafate -await occult blood -clear liquid diet -pending further drop in Hgb or worsening pain, may need EGD -UDS negative for marijuana   Hypokalemia Replace as needed  Microcytic anemia Hemoglobin baseline around 10-11 from 2018 Noted drop in hemoglobin, likely dilutional, but also c/o epigastric tenderness FOBT pending Monitor CBC closely  Hypertension -resume home meds as able  GERD Start IV Protonix  Hypothyroidism -reduce dose of synthroid  Hyperlipidemia Continue Lipitor  OSA on CPAP Continue CPAP  obesity Body mass index is 42.79 kg/m.   Family Communication/Anticipated D/C date and plan/Code Status   DVT prophylaxis: Lovenox ordered. Code Status: Full Code.  Disposition Plan: Status is: Inpatient  Remains inpatient appropriate because:IV treatments appropriate due to intensity of illness or inability to take PO   Dispo:  Patient From: Home  Planned Disposition: Home  Expected discharge date: 10/07/20  Medically stable for discharge: No          Medical Consultants:    None.     Subjective:   C/o "sour stomach"  Objective:    Vitals:   10/06/20 0739 10/06/20 0900 10/06/20  0946 10/06/20 1138  BP: 139/76 (!) 152/78 (!) 151/72 (!) 150/67  Pulse: 72 75 72 63  Resp: 17 16 20 20   Temp: 98.8 F (37.1 C)     TempSrc: Oral     SpO2: 98% 95% 97% 96%  Weight:      Height:        Intake/Output Summary (Last 24 hours) at 10/06/2020 1152 Last data filed at 10/06/2020 0739 Gross per 24 hour  Intake 1626.72 ml  Output --  Net 1626.72 ml   Filed Weights   10/04/20 1027 10/05/20 1658  Weight: 85 kg 89.7 kg    Exam:  General: Appearance:    Severely obese female in no acute distress- only says a few words and speech can be difficult to understand   Mild abdominal tenderness  Lungs:     respirations unlabored  Heart:    Normal heart rate. Normal rhythm. No murmurs, rubs, or gallops.   MS:   All extremities are intact.   Neurologic:   Awake, alert, oriented x 3.     Data Reviewed:   I have personally reviewed following labs and imaging studies:  Labs: Labs show the following:   Basic Metabolic Panel: Recent Labs  Lab 10/04/20 0953 10/04/20 0953 10/05/20 0211 10/06/20 0050  NA 139  --  140 140  K 3.2*   < > 3.3* 3.4*  CL 103  --  106 108  CO2 24  --  24 21*  GLUCOSE 133*  --  93 84  BUN 12  --  10 9  CREATININE 0.87  --  0.71 0.76  CALCIUM 9.9  --  9.1 8.9   < > = values in this interval not displayed.   GFR Estimated Creatinine Clearance: 70.5 mL/min (by C-G formula based on SCr of 0.76 mg/dL). Liver Function Tests: Recent Labs  Lab 10/04/20 0953  AST 28  ALT 30  ALKPHOS 31*  BILITOT 0.7  PROT 7.6  ALBUMIN 3.6   Recent Labs  Lab 10/04/20 0953  LIPASE 24   No results for input(s): AMMONIA in the last 168 hours. Coagulation profile Recent Labs  Lab 10/04/20 1036  INR 1.0    CBC: Recent Labs  Lab 10/04/20 0953 10/05/20 0750 10/05/20 1325 10/06/20 0050  WBC 12.4* 10.6* 11.4* 9.7  NEUTROABS  --  7.8*  --  6.6  HGB 11.0* 8.7* 9.4* 9.0*  HCT 36.0 28.8* 30.3* 29.7*  MCV 77.9* 77.8* 78.1* 77.1*  PLT 325 260 275 261    Cardiac Enzymes: No results for input(s): CKTOTAL, CKMB, CKMBINDEX, TROPONINI in the last 168 hours. BNP (last 3 results) No results for input(s): PROBNP in the last 8760 hours. CBG: No results for input(s): GLUCAP in the last 168 hours. D-Dimer: No results for input(s): DDIMER in the last 72 hours. Hgb A1c: No results for input(s): HGBA1C in the last 72 hours. Lipid Profile: No results for input(s): CHOL, HDL, LDLCALC, TRIG, CHOLHDL, LDLDIRECT in the last 72 hours. Thyroid function studies: Recent Labs    10/05/20 1325  TSH 0.092*   Anemia work up: Recent Labs    10/05/20 1325  VITAMINB12 242  FOLATE 14.9  FERRITIN 554*  TIBC 193*  IRON 59   Sepsis Labs: Recent Labs  Lab 10/04/20 0953 10/04/20 1400 10/04/20 1432 10/05/20 0713 10/05/20 0750 10/05/20 1325 10/06/20 0050  PROCALCITON  --   --  6.67  --  7.99  --  5.25  WBC 12.4*  --   --   --  10.6* 11.4* 9.7  LATICACIDVEN 3.2* 2.2*  --  0.8  --   --   --     Microbiology Recent Results (from the past 240 hour(s))  Respiratory Panel by RT PCR (Flu A&B, Covid) - Nasopharyngeal Swab     Status: None   Collection Time: 10/04/20 10:14 AM   Specimen: Nasopharyngeal Swab  Result Value Ref Range Status   SARS Coronavirus 2 by RT PCR NEGATIVE NEGATIVE Final    Comment: (NOTE) SARS-CoV-2 target nucleic acids are NOT DETECTED.  The SARS-CoV-2 RNA is generally detectable in upper respiratoy specimens during the acute phase of infection. The lowest concentration of SARS-CoV-2 viral copies this assay can detect is 131 copies/mL. A negative result does not preclude SARS-Cov-2 infection and should not be used as the sole basis for treatment or other patient management decisions. A negative result may occur with  improper specimen collection/handling, submission of specimen other than nasopharyngeal swab, presence of viral mutation(s) within the areas targeted by this assay, and inadequate number of viral copies (<131  copies/mL). A negative result must be combined with clinical observations, patient history, and epidemiological information. The expected result is Negative.  Fact Sheet for Patients:  https://www.moore.com/  Fact Sheet for Healthcare Providers:  https://www.young.biz/  This test is no t yet approved or cleared by the [place] FDA and  has been authorized for detection and/or diagnosis of SARS-CoV-2 by FDA under an Emergency Use Authorization (EUA). This EUA will remain  in effect (meaning this test can be used) for the duration of the COVID-19 declaration under  Section 564(b)(1) of the Act, 21 U.S.C. section 360bbb-3(b)(1), unless the authorization is terminated or revoked sooner.     Influenza A by PCR NEGATIVE NEGATIVE Final   Influenza B by PCR NEGATIVE NEGATIVE Final    Comment: (NOTE) The Xpert Xpress SARS-CoV-2/FLU/RSV assay is intended as an aid in  the diagnosis of influenza from Nasopharyngeal swab specimens and  should not be used as a sole basis for treatment. Nasal washings and  aspirates are unacceptable for Xpert Xpress SARS-CoV-2/FLU/RSV  testing.  Fact Sheet for Patients: https://www.moore.com/https://www.fda.gov/media/142436/download  Fact Sheet for Healthcare Providers: https://www.young.biz/https://www.fda.gov/media/142435/download  This test is not yet approved or cleared by the [place] FDA and  has been authorized for detection and/or diagnosis of SARS-CoV-2 by  FDA under an Emergency Use Authorization (EUA). This EUA will remain  in effect (meaning this test can be used) for the duration of the  Covid-19 declaration under Section 564(b)(1) of the Act, 21  U.S.C. section 360bbb-3(b)(1), unless the authorization is  terminated or revoked. Performed at [facility], [address] [place], [place] [address]   Blood Culture (routine x 2)     Status: None (Preliminary result)   Collection Time: 10/04/20 10:36 AM   Specimen: BLOOD  Result  Value Ref Range Status   Specimen Description BLOOD RIGHT ANTECUBITAL  Final   Special Requests   Final    BOTTLES DRAWN AEROBIC AND ANAEROBIC Blood Culture adequate volume   Culture   Final    NO GROWTH 2 DAYS Performed at [facility], [address] [place], [place] [address]    Report Status PENDING  Incomplete  Urine culture     Status: None   Collection Time: 10/04/20  1:44 PM   Specimen: In/Out Cath Urine  Result Value Ref Range Status   Specimen Description IN/OUT CATH URINE  Final   Special Requests NONE  Final   Culture   Final    NO GROWTH Performed at [facility], [address] [place], [place] [address]    Report Status 10/05/2020 FINAL  Final  Blood Culture (routine x 2)     Status: None (Preliminary result)   Collection Time: 10/04/20  2:00 PM   Specimen: BLOOD RIGHT ARM  Result Value Ref Range Status   Specimen Description BLOOD RIGHT ARM  Final   Special Requests   Final    BOTTLES DRAWN AEROBIC ONLY Blood Culture adequate volume   Culture   Final    NO GROWTH 2 DAYS Performed at [facility], [address] [place], [place] [address]    Report Status PENDING  Incomplete  Gastrointestinal Panel by PCR , Stool     Status: None   Collection Time: 10/05/20  2:44 AM   Specimen: Stool  Result Value Ref Range Status   Campylobacter species NOT DETECTED NOT DETECTED Final   Plesimonas shigelloides NOT DETECTED NOT DETECTED Final   Salmonella species NOT DETECTED NOT DETECTED Final   Yersinia enterocolitica NOT DETECTED NOT DETECTED Final   Vibrio species NOT DETECTED NOT DETECTED Final   Vibrio cholerae NOT DETECTED NOT DETECTED Final   Enteroaggregative E coli (EAEC) NOT DETECTED NOT DETECTED Final   Enteropathogenic E coli (EPEC) NOT DETECTED NOT DETECTED Final   Enterotoxigenic E coli (ETEC) NOT DETECTED NOT DETECTED Final   Shiga like toxin producing E coli (STEC) NOT DETECTED NOT DETECTED Final   Shigella/Enteroinvasive E coli (EIEC)  NOT DETECTED NOT DETECTED Final  Cryptosporidium NOT DETECTED NOT DETECTED Final   Cyclospora cayetanensis NOT DETECTED NOT DETECTED Final   Entamoeba histolytica NOT DETECTED NOT DETECTED Final   Giardia lamblia NOT DETECTED NOT DETECTED Final   Adenovirus F40/41 NOT DETECTED NOT DETECTED Final   Astrovirus NOT DETECTED NOT DETECTED Final   Norovirus GI/GII NOT DETECTED NOT DETECTED Final   Rotavirus A NOT DETECTED NOT DETECTED Final   Sapovirus (I, II, IV, and V) NOT DETECTED NOT DETECTED Final    Comment: Performed at [facility] [address] [place], [place] [address]  C Difficile Quick Screen w PCR reflex     Status: None   Collection Time: 10/05/20  2:44 AM   Specimen: Stool  Result Value Ref Range Status   C Diff antigen NEGATIVE NEGATIVE Final   C Diff toxin NEGATIVE NEGATIVE Final   C Diff interpretation No C. difficile detected.  Final    Comment: Performed at [facility], [address] [place], [place] [address]  MRSA PCR Screening     Status: None   Collection Time: 10/05/20  6:00 PM   Specimen: Nasopharyngeal  Result Value Ref Range Status   MRSA by PCR NEGATIVE NEGATIVE Final    Comment:        The GeneXpert MRSA Assay (FDA approved for NASAL specimens only), is one component of a comprehensive MRSA colonization surveillance program. It is not intended to diagnose MRSA infection nor to guide or monitor treatment for MRSA infections. Performed at [facility], [address] [place], [place] [address]     Procedures and diagnostic studies:  CT ABDOMEN PELVIS W CONTRAST  Result Date: 10/04/2020 CLINICAL DATA:  Nausea, vomiting, diffuse abdominal pain. EXAM: CT ABDOMEN AND PELVIS WITH CONTRAST TECHNIQUE: Multidetector CT imaging of the abdomen and pelvis was performed using the standard protocol following bolus administration of intravenous contrast. CONTRAST:  OMNIPAQUE IOHEXOL 300 MG/ML  SOLN COMPARISON:  01/21/2017  FINDINGS: Lower chest: No acute abnormality. Hepatobiliary: No focal liver abnormality is seen. Status post cholecystectomy. No biliary dilatation. Pancreas: Unremarkable. No pancreatic ductal dilatation or surrounding inflammatory changes. Spleen: Normal in size without focal abnormality. Adrenals/Urinary Tract: Normal adrenal glands. 16 mm hypodense, fluid attenuating left upper pole renal mass consistent with a cyst. 3.3 cm hypodense, fluid attenuating left renal mass consistent with a cyst. Punctate nonobstructing left renal calculi. 10 mm nonobstructing right renal calculus. No obstructive uropathy. Normal bladder. Stomach/Bowel: Stomach is within normal limits. Appendix appears normal. No evidence of bowel wall thickening, distention, or inflammatory changes. Vascular/Lymphatic: Abdominal aortic atherosclerosis. No lymphadenopathy. Reproductive: Status post hysterectomy. No adnexal masses. Other: Fat containing left periumbilical hernia.  No ascites. Musculoskeletal: No acute osseous abnormality. No aggressive osseous lesion. Bilateral facet arthropathy at L4-5 and L5-S1. IMPRESSION: 1. No acute abdominal or pelvic pathology. 2. Fat containing left periumbilical hernia. 3. Bilateral nonobstructing renal calculi. 4. Aortic Atherosclerosis (ICD10-I70.0). Electronically Signed   By: Elige Ko   On: 10/04/2020 14:00    Medications:   . atorvastatin  40 mg Oral QHS  . enoxaparin (LOVENOX) injection  40 mg Subcutaneous Q24H  . [START ON 10/07/2020] levothyroxine  50 mcg Oral QAC breakfast  . pantoprazole (PROTONIX) IV  40 mg Intravenous Daily  . potassium chloride  40 mEq Oral Once  . sodium chloride flush  3 mL Intravenous Q12H  . sucralfate  1 g Oral TID WC & HS   Continuous Infusions: . sodium chloride 75 mL/hr at 10/06/20 0442  .  ceFEPime (MAXIPIME) IV 2 g (10/06/20 0536)  . metronidazole 500 mg (10/06/20 1024)  . vancomycin 1,000 mg (10/06/20 1144)     LOS: 2 days   Joseph Art  [facility]   How to contact the [facility] Attending or Consulting provider 7A - 7P or covering provider during after hours 7P -7A, for this patient?  1. Check the care team in [facility] : [facility] and look for a) attending/consulting [facility] provider listed and b) the [facility] team listed 2. Log into [email or web address] and use [facility] universal password to access. If you do not have the password, please contact the hospital operator. 3. Locate the [facility] provider you are looking for under [facility] and page to a number that you can be directly reached. 4. If you still have difficulty reaching the provider, please page the [facility] (Director on Call) for the Hospitalists listed on amion for assistance.  10/06/2020, 11:52 AM

## 2020-10-07 DIAGNOSIS — R112 Nausea with vomiting, unspecified: Secondary | ICD-10-CM

## 2020-10-07 LAB — CBC WITH DIFFERENTIAL/PLATELET
Abs Immature Granulocytes: 0.03 10*3/uL (ref 0.00–0.07)
Basophils Absolute: 0 10*3/uL (ref 0.0–0.1)
Basophils Relative: 0 %
Eosinophils Absolute: 0.3 10*3/uL (ref 0.0–0.5)
Eosinophils Relative: 3 %
HCT: 29.9 % — ABNORMAL LOW (ref 36.0–46.0)
Hemoglobin: 9.4 g/dL — ABNORMAL LOW (ref 12.0–15.0)
Immature Granulocytes: 0 %
Lymphocytes Relative: 27 %
Lymphs Abs: 2.4 10*3/uL (ref 0.7–4.0)
MCH: 23.8 pg — ABNORMAL LOW (ref 26.0–34.0)
MCHC: 31.4 g/dL (ref 30.0–36.0)
MCV: 75.7 fL — ABNORMAL LOW (ref 80.0–100.0)
Monocytes Absolute: 0.5 10*3/uL (ref 0.1–1.0)
Monocytes Relative: 5 %
Neutro Abs: 5.6 10*3/uL (ref 1.7–7.7)
Neutrophils Relative %: 65 %
Platelets: 260 10*3/uL (ref 150–400)
RBC: 3.95 MIL/uL (ref 3.87–5.11)
RDW: 13.7 % (ref 11.5–15.5)
WBC: 8.8 10*3/uL (ref 4.0–10.5)
nRBC: 0 % (ref 0.0–0.2)

## 2020-10-07 LAB — BASIC METABOLIC PANEL
Anion gap: 8 (ref 5–15)
BUN: 7 mg/dL (ref 6–20)
CO2: 21 mmol/L — ABNORMAL LOW (ref 22–32)
Calcium: 9 mg/dL (ref 8.9–10.3)
Chloride: 109 mmol/L (ref 98–111)
Creatinine, Ser: 0.69 mg/dL (ref 0.44–1.00)
GFR, Estimated: >60 mL/min (ref >60)
Glucose, Bld: 102 mg/dL — ABNORMAL HIGH (ref 70–99)
Potassium: 3.5 mmol/L (ref 3.5–5.1)
Sodium: 138 mmol/L (ref 135–145)

## 2020-10-07 LAB — OCCULT BLOOD X 1 CARD TO LAB, STOOL: Fecal Occult Bld: NEGATIVE

## 2020-10-07 MED ORDER — PANTOPRAZOLE SODIUM 40 MG PO TBEC
40.0000 mg | DELAYED_RELEASE_TABLET | Freq: Every day | ORAL | Status: DC
  Administered 2020-10-07 – 2020-10-08 (×2): 40 mg via ORAL
  Filled 2020-10-07: qty 1

## 2020-10-07 NOTE — Plan of Care (Signed)

## 2020-10-07 NOTE — Progress Notes (Signed)
Progress Note    Rivka BarbaraChristalyn B Gelin  ZOX:[id number] DOB: Jan 04, 1961  DOA: 10/04/2020 PCP: System, Provider Not In    Brief Narrative:    Medical records reviewed and are as summarized below:  [name] is an 59 y.o. female complaining of epigastric/substernal chest pain.  Much improved   Assessment/Plan:   Active Problems:   Hypothyroidism   Dyslipidemia   OSA on CPAP   Obesity, Class III, BMI 40-49.9 (morbid obesity) (HCC)   GERD (gastroesophageal reflux disease)   Anemia   No source of infection so not convinced patient was septic -GI pathogen panel/c diff negative -no sign of PNA -d/c abx  Abdominal pain with N/V -?GI ulcer/gastritis-- will need referral to GI outpatient -CT scan negative of abd/pelvis -added PPI and Carafate- carafate helped the most -await occult blood -advance diet as tolerated -UDS negative for marijuana   Hypokalemia Replace as needed  Microcytic anemia Hemoglobin baseline around 10-11 from 2018 Noted drop in hemoglobin, likely dilutional, but also c/o epigastric tenderness FOBT pending Monitor CBC closely  Hypertension -resume home meds as able  GERD PO Protonix  Hypothyroidism -reduce dose of synthroid as TSH low and free t4 high  Hyperlipidemia Continue Lipitor  OSA on CPAP Continue CPAP  obesity Body mass index is 42.79 kg/m.   Family Communication/Anticipated D/C date and plan/Code Status   DVT prophylaxis: Lovenox ordered. Code Status: Full Code.  Disposition Plan: Status is: Inpatient  Remains inpatient appropriate because:IV treatments appropriate due to intensity of illness or inability to take PO   Dispo:  Patient From: Home  Planned Disposition: Home  Expected discharge date: 10/08/20  Medically stable for discharge: No- 24 hours          Medical Consultants:    None.     Subjective:   Tolerating advancement of food Says carafate helped the most  Objective:     Vitals:   10/07/20 0300 10/07/20 0507 10/07/20 0752 10/07/20 1123  BP:  134/70 123/70 110/62  Pulse: 62 64 70 70  Resp: 18 16 19 20   Temp:  99 F (37.2 C) 99 F (37.2 C) 98.4 F (36.9 C)  TempSrc:  Oral Oral Oral  SpO2: 100% 96% 95% 94%  Weight:      Height:        Intake/Output Summary (Last 24 hours) at 10/07/2020 1302 Last data filed at 10/07/2020 [id number] Gross per 24 hour  Intake 1740 ml  Output 750 ml  Net 990 ml   Filed Weights   10/04/20 1027 10/05/20 1658  Weight: 85 kg 89.7 kg    Exam:  General: Appearance:    Obese female in no acute distress- sitting in chair     Lungs:     respirations unlabored  Heart:    Normal heart rate. Normal rhythm. No murmurs, rubs, or gallops.   MS:   All extremities are intact.   Neurologic:   Awake, alert, oriented x 3. No apparent focal neurological           Defect- much more engaged    Data Reviewed:   I have personally reviewed following labs and imaging studies:  Labs: Labs show the following:   Basic Metabolic Panel: Recent Labs  Lab 10/04/20 0953 10/04/20 0953 10/05/20 0211 10/05/20 0211 10/06/20 0050 10/07/20 0030  NA 139  --  140  --  140 138  K 3.2*   < > 3.3*   < > 3.4* 3.5  CL 103  --  106  --  108 109  CO2 24  --  24  --  21* 21*  GLUCOSE 133*  --  93  --  84 102*  BUN 12  --  10  --  9 7  CREATININE 0.87  --  0.71  --  0.76 0.69  CALCIUM 9.9  --  9.1  --  8.9 9.0   < > = values in this interval not displayed.   GFR Estimated Creatinine Clearance: 70.5 mL/min (by C-G formula based on SCr of 0.69 mg/dL). Liver Function Tests: Recent Labs  Lab 10/04/20 0953  AST 28  ALT 30  ALKPHOS 31*  BILITOT 0.7  PROT 7.6  ALBUMIN 3.6   Recent Labs  Lab 10/04/20 0953  LIPASE 24   No results for input(s): AMMONIA in the last 168 hours. Coagulation profile Recent Labs  Lab 10/04/20 1036  INR 1.0    CBC: Recent Labs  Lab 10/04/20 0953 10/05/20 0750 10/05/20 1325 10/06/20 0050  10/07/20 0030  WBC 12.4* 10.6* 11.4* 9.7 8.8  NEUTROABS  --  7.8*  --  6.6 5.6  HGB 11.0* 8.7* 9.4* 9.0* 9.4*  HCT 36.0 28.8* 30.3* 29.7* 29.9*  MCV 77.9* 77.8* 78.1* 77.1* 75.7*  PLT 325 260 275 261 260   Cardiac Enzymes: No results for input(s): CKTOTAL, CKMB, CKMBINDEX, TROPONINI in the last 168 hours. BNP (last 3 results) No results for input(s): PROBNP in the last 8760 hours. CBG: No results for input(s): GLUCAP in the last 168 hours. D-Dimer: No results for input(s): DDIMER in the last 72 hours. Hgb A1c: No results for input(s): HGBA1C in the last 72 hours. Lipid Profile: No results for input(s): CHOL, HDL, LDLCALC, TRIG, CHOLHDL, LDLDIRECT in the last 72 hours. Thyroid function studies: Recent Labs    10/05/20 1325  TSH 0.092*   Anemia work up: Recent Labs    10/05/20 1325  VITAMINB12 242  FOLATE 14.9  FERRITIN 554*  TIBC 193*  IRON 59   Sepsis Labs: Recent Labs  Lab 10/04/20 0953 10/04/20 0953 10/04/20 1400 10/04/20 1432 10/05/20 0713 10/05/20 0750 10/05/20 1325 10/06/20 0050 10/07/20 0030  PROCALCITON  --   --   --  6.67  --  7.99  --  5.25  --   WBC 12.4*   < >  --   --   --  10.6* 11.4* 9.7 8.8  LATICACIDVEN 3.2*  --  2.2*  --  0.8  --   --   --   --    < > = values in this interval not displayed.    Microbiology Recent Results (from the past 240 hour(s))  Respiratory Panel by RT PCR (Flu A&B, Covid) - Nasopharyngeal Swab     Status: None   Collection Time: 10/04/20 10:14 AM   Specimen: Nasopharyngeal Swab  Result Value Ref Range Status   SARS Coronavirus 2 by RT PCR NEGATIVE NEGATIVE Final    Comment: (NOTE) SARS-CoV-2 target nucleic acids are NOT DETECTED.  The SARS-CoV-2 RNA is generally detectable in upper respiratoy specimens during the acute phase of infection. The lowest concentration of SARS-CoV-2 viral copies this assay can detect is 131 copies/mL. A negative result does not preclude SARS-Cov-2 infection and should not be used  as the sole basis for treatment or other patient management decisions. A negative result may occur with  improper specimen collection/handling, submission of specimen other than nasopharyngeal swab, presence of viral mutation(s) within the areas targeted by this assay, and  inadequate number of viral copies (<131 copies/mL). A negative result must be combined with clinical observations, patient history, and epidemiological information. The expected result is Negative.  Fact Sheet for Patients:  https://www.moore.com/  Fact Sheet for Healthcare Providers:  https://www.young.biz/  This test is no t yet approved or cleared by the [place] FDA and  has been authorized for detection and/or diagnosis of SARS-CoV-2 by FDA under an Emergency Use Authorization (EUA). This EUA will remain  in effect (meaning this test can be used) for the duration of the COVID-19 declaration under Section 564(b)(1) of the Act, 21 U.S.C. section 360bbb-3(b)(1), unless the authorization is terminated or revoked sooner.     Influenza A by PCR NEGATIVE NEGATIVE Final   Influenza B by PCR NEGATIVE NEGATIVE Final    Comment: (NOTE) The Xpert Xpress SARS-CoV-2/FLU/RSV assay is intended as an aid in  the diagnosis of influenza from Nasopharyngeal swab specimens and  should not be used as a sole basis for treatment. Nasal washings and  aspirates are unacceptable for Xpert Xpress SARS-CoV-2/FLU/RSV  testing.  Fact Sheet for Patients: https://www.moore.com/  Fact Sheet for Healthcare Providers: https://www.young.biz/  This test is not yet approved or cleared by the [place] FDA and  has been authorized for detection and/or diagnosis of SARS-CoV-2 by  FDA under an Emergency Use Authorization (EUA). This EUA will remain  in effect (meaning this test can be used) for the duration of the  Covid-19 declaration under Section  564(b)(1) of the Act, 21  U.S.C. section 360bbb-3(b)(1), unless the authorization is  terminated or revoked. Performed at [facility], [address] [place], [place] [address]   Blood Culture (routine x 2)     Status: None (Preliminary result)   Collection Time: 10/04/20 10:36 AM   Specimen: BLOOD  Result Value Ref Range Status   Specimen Description BLOOD RIGHT ANTECUBITAL  Final   Special Requests   Final    BOTTLES DRAWN AEROBIC AND ANAEROBIC Blood Culture adequate volume   Culture   Final    NO GROWTH 2 DAYS Performed at [facility], [address] [place], [place] [address]    Report Status PENDING  Incomplete  Urine culture     Status: None   Collection Time: 10/04/20  1:44 PM   Specimen: In/Out Cath Urine  Result Value Ref Range Status   Specimen Description IN/OUT CATH URINE  Final   Special Requests NONE  Final   Culture   Final    NO GROWTH Performed at [facility], [address] [place], [place] [address]    Report Status 10/05/2020 FINAL  Final  Blood Culture (routine x 2)     Status: None (Preliminary result)   Collection Time: 10/04/20  2:00 PM   Specimen: BLOOD RIGHT ARM  Result Value Ref Range Status   Specimen Description BLOOD RIGHT ARM  Final   Special Requests   Final    BOTTLES DRAWN AEROBIC ONLY Blood Culture adequate volume   Culture   Final    NO GROWTH 2 DAYS Performed at [facility], [address], [place], [place] [address]    Report Status PENDING  Incomplete  Gastrointestinal Panel by PCR , Stool     Status: None   Collection Time: 10/05/20  2:44 AM   Specimen: Stool  Result Value Ref Range Status   Campylobacter species NOT DETECTED NOT DETECTED Final   Plesimonas shigelloides NOT DETECTED NOT DETECTED Final  Salmonella species NOT DETECTED NOT DETECTED Final   Yersinia enterocolitica NOT DETECTED NOT DETECTED Final   Vibrio species NOT DETECTED NOT DETECTED Final   Vibrio cholerae NOT DETECTED NOT  DETECTED Final   Enteroaggregative E coli (EAEC) NOT DETECTED NOT DETECTED Final   Enteropathogenic E coli (EPEC) NOT DETECTED NOT DETECTED Final   Enterotoxigenic E coli (ETEC) NOT DETECTED NOT DETECTED Final   Shiga like toxin producing E coli (STEC) NOT DETECTED NOT DETECTED Final   Shigella/Enteroinvasive E coli (EIEC) NOT DETECTED NOT DETECTED Final   Cryptosporidium NOT DETECTED NOT DETECTED Final   Cyclospora cayetanensis NOT DETECTED NOT DETECTED Final   Entamoeba histolytica NOT DETECTED NOT DETECTED Final   Giardia lamblia NOT DETECTED NOT DETECTED Final   Adenovirus F40/41 NOT DETECTED NOT DETECTED Final   Astrovirus NOT DETECTED NOT DETECTED Final   Norovirus GI/GII NOT DETECTED NOT DETECTED Final   Rotavirus A NOT DETECTED NOT DETECTED Final   Sapovirus (I, II, IV, and V) NOT DETECTED NOT DETECTED Final    Comment: Performed at [facility], [address] [place], [place] [address]  C Difficile Quick Screen w PCR reflex     Status: None   Collection Time: 10/05/20  2:44 AM   Specimen: Stool  Result Value Ref Range Status   C Diff antigen NEGATIVE NEGATIVE Final   C Diff toxin NEGATIVE NEGATIVE Final   C Diff interpretation No C. difficile detected.  Final    Comment: Performed at [facility], [address] [place], [place] [address]  MRSA PCR Screening     Status: None   Collection Time: 10/05/20  6:00 PM   Specimen: Nasopharyngeal  Result Value Ref Range Status   MRSA by PCR NEGATIVE NEGATIVE Final    Comment:        The GeneXpert MRSA Assay (FDA approved for NASAL specimens only), is one component of a comprehensive MRSA colonization surveillance program. It is not intended to diagnose MRSA infection nor to guide or monitor treatment for MRSA infections. Performed at [facility], [address] [place], [place] [address]     Procedures and diagnostic studies:  No results found.  Medications:   . atorvastatin  40 mg Oral QHS   . enoxaparin (LOVENOX) injection  40 mg Subcutaneous Q24H  . levothyroxine  50 mcg Oral QAC breakfast  . pantoprazole  40 mg Oral Daily  . sodium chloride flush  3 mL Intravenous Q12H  . sucralfate  1 g Oral TID WC & HS   Continuous Infusions:    LOS: 3 days   Joseph Art  [facility]   How to contact the [facility] Attending or Consulting provider 7A - 7P or covering provider during after hours 7P -7A, for this patient?  1. Check the care team in [facility] and look for a) attending/consulting [facility] provider listed and b) the [facility] team listed 2. Log into [email or web address] and use [facility]'s universal password to access. If you do not have the password, please contact the hospital operator. 3. Locate the [facility] provider you are looking for under [facility] and page to a number that you can be directly reached. 4. If you still have difficulty reaching the provider, please page the [facility] (Director on Call) for the Hospitalists listed on amion for assistance.  10/07/2020, 1:02 PM

## 2020-10-07 NOTE — Progress Notes (Signed)
Pt does not want at this time.

## 2020-10-08 DIAGNOSIS — R109 Unspecified abdominal pain: Secondary | ICD-10-CM

## 2020-10-08 LAB — BASIC METABOLIC PANEL
Anion gap: 10 (ref 5–15)
BUN: 7 mg/dL (ref 6–20)
CO2: 22 mmol/L (ref 22–32)
Calcium: 9.5 mg/dL (ref 8.9–10.3)
Chloride: 107 mmol/L (ref 98–111)
Creatinine, Ser: 0.72 mg/dL (ref 0.44–1.00)
GFR, Estimated: >60 mL/min (ref >60)
Glucose, Bld: 80 mg/dL (ref 70–99)
Potassium: 3.7 mmol/L (ref 3.5–5.1)
Sodium: 139 mmol/L (ref 135–145)

## 2020-10-08 LAB — CBC WITH DIFFERENTIAL/PLATELET
Abs Immature Granulocytes: 0.03 10*3/uL (ref 0.00–0.07)
Basophils Absolute: 0 10*3/uL (ref 0.0–0.1)
Basophils Relative: 0 %
Eosinophils Absolute: 0.2 10*3/uL (ref 0.0–0.5)
Eosinophils Relative: 3 %
HCT: 33 % — ABNORMAL LOW (ref 36.0–46.0)
Hemoglobin: 10.2 g/dL — ABNORMAL LOW (ref 12.0–15.0)
Immature Granulocytes: 0 %
Lymphocytes Relative: 27 %
Lymphs Abs: 2.5 10*3/uL (ref 0.7–4.0)
MCH: 23.8 pg — ABNORMAL LOW (ref 26.0–34.0)
MCHC: 30.9 g/dL (ref 30.0–36.0)
MCV: 77.1 fL — ABNORMAL LOW (ref 80.0–100.0)
Monocytes Absolute: 0.6 10*3/uL (ref 0.1–1.0)
Monocytes Relative: 7 %
Neutro Abs: 5.7 10*3/uL (ref 1.7–7.7)
Neutrophils Relative %: 63 %
Platelets: 270 10*3/uL (ref 150–400)
RBC: 4.28 MIL/uL (ref 3.87–5.11)
RDW: 14.1 % (ref 11.5–15.5)
WBC: 9.1 10*3/uL (ref 4.0–10.5)
nRBC: 0 % (ref 0.0–0.2)

## 2020-10-08 LAB — MAGNESIUM: Magnesium: 1.9 mg/dL (ref 1.7–2.4)

## 2020-10-08 MED ORDER — SUCRALFATE 1 GM/10ML PO SUSP
1.0000 g | Freq: Three times a day (TID) | ORAL | 0 refills | Status: AC
Start: 2020-10-08 — End: ?

## 2020-10-08 MED ORDER — OMEPRAZOLE 20 MG PO CPDR
20.0000 mg | DELAYED_RELEASE_CAPSULE | Freq: Every day | ORAL | 0 refills | Status: DC
Start: 2020-10-08 — End: 2022-05-17

## 2020-10-08 MED ORDER — LEVOTHYROXINE SODIUM 50 MCG PO TABS: 50.0000 ug | ORAL_TABLET | Freq: Every day | ORAL | 0 refills | Status: AC

## 2020-10-08 NOTE — Discharge Summary (Signed)
Physician Discharge Summary  [name] CVE:[id number] DOB: Jan 24, 1961 DOA: 10/04/2020  PCP: System, Provider Not In  Admit date: 10/04/2020 Discharge date: 10/08/2020  Admitted From: home Discharge disposition: home   Recommendations for Outpatient Follow-Up:   1. Outpatient GI referral 2. TSH in 4- 6 weeks   Discharge Diagnosis:   Active Problems:   Hypothyroidism   Dyslipidemia   OSA on CPAP   Obesity, Class III, BMI 40-49.9 (morbid obesity) (HCC)   GERD (gastroesophageal reflux disease)   Anemia    Discharge Condition: Improved.  Diet recommendation: Low sodium, heart healthy.    Wound care: None.  Code status: Full.   History of Present Illness:    [name] is a 59 y.o. female with medical history significant of hypothyroidism; HLD; and OSA on CPAP presenting with chest/abdominal pain.   She was fine last night but awoke this AM with pain and vomiting.  She complained of chills.  She has complained of abdominal pain and R arm pain.  Also with a headache.  Also with chest pain.  +n/v/d all morning, 3-4 episodes.  No recent antibiotics.  No sick contacts.  +fever here, not noted at home.  Mild SOB on arrival, but not now and no cough.  She is complaining of acute R arm pain.  Her last knee injection was maybe 2 months ago.  She has a chronic "bone problem" for which she is on disability - if she walks too long her knees and legs give out.  She has been on disability since she was a teenager.   Hospital Course by Problem:    No source of infection so not convinced patient was septic -GI pathogen panel/c diff negative -no sign of PNA -d/c abx  Abdominal pain with N/V -?GI ulcer/gastritis-- will need referral to GI outpatient for consideration of EGD -CT scan negative of abd/pelvis -added PPI and Carafate- carafate helped the most -advance diet as tolerated -UDS negative for marijuana   Hypokalemia Replace as  needed  Microcytic anemia Hemoglobin baseline around 10-68from 2018 Noted drop in hemoglobin,likelydilutional FOBT pending Outpatient follow up for possible EGD  Hypertension -resume home meds as able  GERD PO Protonix  Hypothyroidism -reduce dose of synthroid as TSH low and free t4 high -outpatient follow up  Hyperlipidemia Continue Lipitor  OSA on CPAP Continue CPAP  obesity Body mass index is 42.79 kg/m. -encouraged weight loss    Medical Consultants:      Discharge Exam:   Vitals:   10/08/20 0339 10/08/20 0835  BP: (!) 125/57 124/77  Pulse: 61 69  Resp: 16 (!) 23  Temp: 98.4 F (36.9 C) 98.4 F (36.9 C)  SpO2: 97% 97%   Vitals:   10/07/20 2010 10/07/20 2320 10/08/20 0339 10/08/20 0835  BP: (!) 125/94 129/88 (!) 125/57 124/77  Pulse: 70 69 61 69  Resp: (!) 22 17 16  (!) 23  Temp: 97.8 F (36.6 C) 98.4 F (36.9 C) 98.4 F (36.9 C) 98.4 F (36.9 C)  TempSrc: Oral Oral Oral Oral  SpO2: 97% 98% 97% 97%  Weight:      Height:        General exam: Appears calm and comfortable.   The results of significant diagnostics from this hospitalization (including imaging, microbiology, ancillary and laboratory) are listed below for reference.     Procedures and Diagnostic Studies:   CT ABDOMEN PELVIS W CONTRAST  Result Date: 10/04/2020 CLINICAL DATA:  Nausea, vomiting, diffuse abdominal  pain. EXAM: CT ABDOMEN AND PELVIS WITH CONTRAST TECHNIQUE: Multidetector CT imaging of the abdomen and pelvis was performed using the standard protocol following bolus administration of intravenous contrast. CONTRAST:  OMNIPAQUE IOHEXOL 300 MG/ML  SOLN COMPARISON:  01/21/2017 FINDINGS: Lower chest: No acute abnormality. Hepatobiliary: No focal liver abnormality is seen. Status post cholecystectomy. No biliary dilatation. Pancreas: Unremarkable. No pancreatic ductal dilatation or surrounding inflammatory changes. Spleen: Normal in size without focal  abnormality. Adrenals/Urinary Tract: Normal adrenal glands. 16 mm hypodense, fluid attenuating left upper pole renal mass consistent with a cyst. 3.3 cm hypodense, fluid attenuating left renal mass consistent with a cyst. Punctate nonobstructing left renal calculi. 10 mm nonobstructing right renal calculus. No obstructive uropathy. Normal bladder. Stomach/Bowel: Stomach is within normal limits. Appendix appears normal. No evidence of bowel wall thickening, distention, or inflammatory changes. Vascular/Lymphatic: Abdominal aortic atherosclerosis. No lymphadenopathy. Reproductive: Status post hysterectomy. No adnexal masses. Other: Fat containing left periumbilical hernia.  No ascites. Musculoskeletal: No acute osseous abnormality. No aggressive osseous lesion. Bilateral facet arthropathy at L4-5 and L5-S1. IMPRESSION: 1. No acute abdominal or pelvic pathology. 2. Fat containing left periumbilical hernia. 3. Bilateral nonobstructing renal calculi. 4. Aortic Atherosclerosis (ICD10-I70.0). Electronically Signed   By: Elige Ko   On: 10/04/2020 14:00   DG Chest Portable 1 View  Result Date: 10/04/2020 CLINICAL DATA:  Chest pain. EXAM: PORTABLE CHEST 1 VIEW COMPARISON:  December 16, 2014. FINDINGS: The heart size and mediastinal contours are within normal limits. Both lungs are clear. No pneumothorax or pleural effusion is noted. The visualized skeletal structures are unremarkable. IMPRESSION: No active disease. Aortic Atherosclerosis (ICD10-I70.0). Electronically Signed   By: Lupita Raider M.D.   On: 10/04/2020 10:16     Labs:   Basic Metabolic Panel: Recent Labs  Lab 10/04/20 0953 10/04/20 0953 10/05/20 0211 10/05/20 0211 10/06/20 0050 10/06/20 0050 10/07/20 0030 10/08/20 0047  NA 139  --  140  --  140  --  138 139  K 3.2*   < > 3.3*   < > 3.4*   < > 3.5 3.7  CL 103  --  106  --  108  --  109 107  CO2 24  --  24  --  21*  --  21* 22  GLUCOSE 133*  --  93  --  84  --  102* 80  BUN 12  --   10  --  9  --  7 7  CREATININE 0.87  --  0.71  --  0.76  --  0.69 0.72  CALCIUM 9.9  --  9.1  --  8.9  --  9.0 9.5  MG  --   --   --   --   --   --   --  1.9   < > = values in this interval not displayed.   GFR Estimated Creatinine Clearance: 70.5 mL/min (by C-G formula based on SCr of 0.72 mg/dL). Liver Function Tests: Recent Labs  Lab 10/04/20 0953  AST 28  ALT 30  ALKPHOS 31*  BILITOT 0.7  PROT 7.6  ALBUMIN 3.6   Recent Labs  Lab 10/04/20 0953  LIPASE 24   No results for input(s): AMMONIA in the last 168 hours. Coagulation profile Recent Labs  Lab 10/04/20 1036  INR 1.0    CBC: Recent Labs  Lab 10/05/20 0750 10/05/20 1325 10/06/20 0050 10/07/20 0030 10/08/20 0047  WBC 10.6* 11.4* 9.7 8.8 9.1  NEUTROABS 7.8*  --  6.6 5.6 5.7  HGB 8.7* 9.4* 9.0* 9.4* 10.2*  HCT 28.8* 30.3* 29.7* 29.9* 33.0*  MCV 77.8* 78.1* 77.1* 75.7* 77.1*  PLT 260 275 261 260 270   Cardiac Enzymes: No results for input(s): CKTOTAL, CKMB, CKMBINDEX, TROPONINI in the last 168 hours. BNP: Invalid input(s): POCBNP CBG: No results for input(s): GLUCAP in the last 168 hours. D-Dimer No results for input(s): DDIMER in the last 72 hours. Hgb A1c No results for input(s): HGBA1C in the last 72 hours. Lipid Profile No results for input(s): CHOL, HDL, LDLCALC, TRIG, CHOLHDL, LDLDIRECT in the last 72 hours. Thyroid function studies Recent Labs    10/05/20 1325  TSH 0.092*   Anemia work up Recent Labs    10/05/20 1325  VITAMINB12 242  FOLATE 14.9  FERRITIN 554*  TIBC 193*  IRON 59   Microbiology Recent Results (from the past 240 hour(s))  Respiratory Panel by RT PCR (Flu A&B, Covid) - Nasopharyngeal Swab     Status: None   Collection Time: 10/04/20 10:14 AM   Specimen: Nasopharyngeal Swab  Result Value Ref Range Status   SARS Coronavirus 2 by RT PCR NEGATIVE NEGATIVE Final    Comment: (NOTE) SARS-CoV-2 target nucleic acids are NOT DETECTED.  The SARS-CoV-2 RNA is generally  detectable in upper respiratoy specimens during the acute phase of infection. The lowest concentration of SARS-CoV-2 viral copies this assay can detect is 131 copies/mL. A negative result does not preclude SARS-Cov-2 infection and should not be used as the sole basis for treatment or other patient management decisions. A negative result may occur with  improper specimen collection/handling, submission of specimen other than nasopharyngeal swab, presence of viral mutation(s) within the areas targeted by this assay, and inadequate number of viral copies (<131 copies/mL). A negative result must be combined with clinical observations, patient history, and epidemiological information. The expected result is Negative.  Fact Sheet for Patients:  https://www.moore.com/https://www.fda.gov/media/142436/download  Fact Sheet for Healthcare Providers:  https://www.young.biz/https://www.fda.gov/media/142435/download  This test is no t yet approved or cleared by the [place] FDA and  has been authorized for detection and/or diagnosis of SARS-CoV-2 by FDA under an Emergency Use Authorization (EUA). This EUA will remain  in effect (meaning this test can be used) for the duration of the COVID-19 declaration under Section 564(b)(1) of the Act, 21 U.S.C. section 360bbb-3(b)(1), unless the authorization is terminated or revoked sooner.     Influenza A by PCR NEGATIVE NEGATIVE Final   Influenza B by PCR NEGATIVE NEGATIVE Final    Comment: (NOTE) The Xpert Xpress SARS-CoV-2/FLU/RSV assay is intended as an aid in  the diagnosis of influenza from Nasopharyngeal swab specimens and  should not be used as a sole basis for treatment. Nasal washings and  aspirates are unacceptable for Xpert Xpress SARS-CoV-2/FLU/RSV  testing.  Fact Sheet for Patients: https://www.moore.com/https://www.fda.gov/media/142436/download  Fact Sheet for Healthcare Providers: https://www.young.biz/https://www.fda.gov/media/142435/download  This test is not yet approved or cleared by the [place] FDA and   has been authorized for detection and/or diagnosis of SARS-CoV-2 by  FDA under an Emergency Use Authorization (EUA). This EUA will remain  in effect (meaning this test can be used) for the duration of the  Covid-19 declaration under Section 564(b)(1) of the Act, 21  U.S.C. section 360bbb-3(b)(1), unless the authorization is  terminated or revoked. Performed at [facility], [address] [place], [place] [address]   Blood Culture (routine x 2)     Status: None (Preliminary result)   Collection Time:  10/04/20 10:36 AM   Specimen: BLOOD  Result Value Ref Range Status   Specimen Description BLOOD RIGHT ANTECUBITAL  Final   Special Requests   Final    BOTTLES DRAWN AEROBIC AND ANAEROBIC Blood Culture adequate volume   Culture   Final    NO GROWTH 4 DAYS Performed at [facility], [address] [place], [place] [address]    Report Status PENDING  Incomplete  Urine culture     Status: None   Collection Time: 10/04/20  1:44 PM   Specimen: In/Out Cath Urine  Result Value Ref Range Status   Specimen Description IN/OUT CATH URINE  Final   Special Requests NONE  Final   Culture   Final    NO GROWTH Performed at [facility], [address] [place], [place] [address]    Report Status 10/05/2020 FINAL  Final  Blood Culture (routine x 2)     Status: None (Preliminary result)   Collection Time: 10/04/20  2:00 PM   Specimen: BLOOD RIGHT ARM  Result Value Ref Range Status   Specimen Description BLOOD RIGHT ARM  Final   Special Requests   Final    BOTTLES DRAWN AEROBIC ONLY Blood Culture adequate volume   Culture   Final    NO GROWTH 4 DAYS Performed at [facility], [address], [place], [place] [address]    Report Status PENDING  Incomplete  Gastrointestinal Panel by PCR , Stool     Status: None   Collection Time: 10/05/20  2:44 AM   Specimen: Stool  Result Value Ref Range Status   Campylobacter species NOT DETECTED NOT DETECTED Final   Plesimonas  shigelloides NOT DETECTED NOT DETECTED Final   Salmonella species NOT DETECTED NOT DETECTED Final   Yersinia enterocolitica NOT DETECTED NOT DETECTED Final   Vibrio species NOT DETECTED NOT DETECTED Final   Vibrio cholerae NOT DETECTED NOT DETECTED Final   Enteroaggregative E coli (EAEC) NOT DETECTED NOT DETECTED Final   Enteropathogenic E coli (EPEC) NOT DETECTED NOT DETECTED Final   Enterotoxigenic E coli (ETEC) NOT DETECTED NOT DETECTED Final   Shiga like toxin producing E coli (STEC) NOT DETECTED NOT DETECTED Final   Shigella/Enteroinvasive E coli (EIEC) NOT DETECTED NOT DETECTED Final   Cryptosporidium NOT DETECTED NOT DETECTED Final   Cyclospora cayetanensis NOT DETECTED NOT DETECTED Final   Entamoeba histolytica NOT DETECTED NOT DETECTED Final   Giardia lamblia NOT DETECTED NOT DETECTED Final   Adenovirus F40/41 NOT DETECTED NOT DETECTED Final   Astrovirus NOT DETECTED NOT DETECTED Final   Norovirus GI/GII NOT DETECTED NOT DETECTED Final   Rotavirus A NOT DETECTED NOT DETECTED Final   Sapovirus (I, II, IV, and V) NOT DETECTED NOT DETECTED Final    Comment: Performed at [facility], [address] [place], [place] [address]  C Difficile Quick Screen w PCR reflex     Status: None   Collection Time: 10/05/20  2:44 AM   Specimen: Stool  Result Value Ref Range Status   C Diff antigen NEGATIVE NEGATIVE Final   C Diff toxin NEGATIVE NEGATIVE Final   C Diff interpretation No C. difficile detected.  Final    Comment: Performed at [facility], [address] [place], [place] [address]  MRSA PCR Screening     Status: None   Collection Time: 10/05/20  6:00 PM   Specimen: Nasopharyngeal  Result Value Ref Range Status   MRSA by PCR  NEGATIVE NEGATIVE Final    Comment:        The GeneXpert MRSA Assay (FDA approved for NASAL specimens only), is one component of a comprehensive MRSA colonization surveillance program. It is not intended to diagnose MRSA infection nor  to guide or monitor treatment for MRSA infections. Performed at [facility], [address], [place], [place] [address]      Discharge Instructions:   Discharge Instructions    Diet - low sodium heart healthy   Complete by: As directed    Discharge instructions   Complete by: As directed    Will need to follow up with GI as an outpatient   Increase activity slowly   Complete by: As directed      Allergies as of 10/08/2020   No Known Allergies     Medication List    STOP taking these medications   hydrochlorothiazide 25 MG tablet Commonly known as: HYDRODIURIL     TAKE these medications   atorvastatin 40 MG tablet Commonly known as: LIPITOR Take 40 mg by mouth at bedtime.   ferrous sulfate 325 (65 FE) MG tablet Take 1 tablet (325 mg total) by mouth daily. What changed:   when to take this  reasons to take this   fluticasone 50 MCG/ACT nasal spray Commonly known as: FLONASE Place 2 sprays into both nostrils daily as needed for allergies or rhinitis.   levothyroxine 50 MCG tablet Commonly known as: SYNTHROID Take 1 tablet (50 mcg total) by mouth daily before breakfast. Start taking on: October 09, 2020 What changed:   medication strength  how much to take   omeprazole 20 MG capsule Commonly known as: PRILOSEC Take 1 capsule (20 mg total) by mouth daily. What changed:   when to take this  reasons to take this   PRESCRIPTION MEDICATION See admin instructions. CPAP- At bedtime   ProAir HFA 108 (90 Base) MCG/ACT inhaler Generic drug: albuterol Inhale 2 puffs into the lungs every 6 (six) hours as needed for wheezing or shortness of breath.   sucralfate 1 GM/10ML suspension Commonly known as: CARAFATE Take 10 mLs (1 g total) by mouth 4 (four) times daily -  with meals and at bedtime.       Follow-up Information    will need to follow up with PCP for referral to GI Follow up.                Time coordinating discharge: 35  min  Signed:  Joseph Art DO  [facility] 10/08/2020, 9:10 AM

## 2020-10-09 LAB — CULTURE, BLOOD (ROUTINE X 2)
Culture: NO GROWTH
Culture: NO GROWTH
Special Requests: ADEQUATE
Special Requests: ADEQUATE

## 2021-02-24 NOTE — Progress Notes (Signed)
Sent message, via epic in basket, requesting orders in epic from surgeon.  

## 2021-02-28 DIAGNOSIS — M1711 Unilateral primary osteoarthritis, right knee: Secondary | ICD-10-CM

## 2021-03-02 NOTE — Patient Instructions (Addendum)
DUE TO COVID-19 ONLY ONE VISITOR IS ALLOWED TO COME WITH YOU AND STAY IN THE WAITING ROOM ONLY DURING PRE OP AND PROCEDURE DAY OF SURGERY. THE 1 VISITOR  MAY VISIT WITH YOU AFTER SURGERY IN YOUR PRIVATE ROOM DURING VISITING HOURS ONLY!  YOU NEED TO HAVE A COVID 19 TEST ON__3/25_____ @_______ , THIS TEST MUST BE DONE BEFORE SURGERY,  COVID TESTING SITE [address] [place] , IT IS ON THE RIGHT GOING OUT [address] APPROXIMATELY  2 MINUTES PAST [employer] ON THE RIGHT. ONCE YOUR COVID TEST IS COMPLETED,  PLEASE BEGIN THE QUARANTINE INSTRUCTIONS AS OUTLINED IN YOUR HANDOUT.                [name]    Your procedure is scheduled on: 03/15/21   Report to [facility] Main  Entrance   Report to Short stay at 5:30 AM     Call this number if you have problems the morning of surgery [phone]    BRUSH YOUR TEETH MORNING OF SURGERY AND RINSE YOUR MOUTH OUT, NO CHEWING GUM CANDY OR MINTS.    No food after midnight.    You may have clear liquid until 4:30 AM.    At 4:00 AM drink pre surgery drink  Nothing by mouth after 4:30 AM.   Take these medicines the morning of surgery with A SIP OF WATER: Levothyroxine, use your inhaler if needed and bring with you. Bring your mask and tubing with you.                                 You may not have any metal on your body including hair pins and              piercings  Do not wear jewelry, make-up, lotions, powders or perfumes, deodorant             Do not wear nail polish on your fingernails.  Do not shave  48 hours prior to surgery.                 Do not bring valuables to the hospital. [facility] IS NOT             RESPONSIBLE   FOR VALUABLES.  Contacts, dentures or bridgework may not be worn into surgery. .     Patients discharged the day of surgery will not be allowed to drive home.  IF YOU ARE HAVING SURGERY AND GOING HOME THE SAME DAY, YOU MUST HAVE AN ADULT TO DRIVE YOU HOME AND  BE WITH YOU FOR 24 HOURS. YOU MAY GO HOME BY TAXI OR UBER OR ORTHERWISE, BUT AN ADULT MUST ACCOMPANY YOU HOME AND STAY WITH YOU FOR 24 HOURS.  Name and phone number of your driver:  Special Instructions: N/A              Please read over the following fact sheets you were given: _____________________________________________________________________             [facility] - Preparing for Surgery Before surgery, you can play an important role.  Because skin is not sterile, your skin needs to be as free of germs as possible.  You can reduce the number of germs on your skin by washing with CHG (chlorahexidine gluconate) soap before surgery.  CHG is an antiseptic cleaner which kills germs and bonds with the skin to continue  killing germs even after washing. Please DO NOT use if you have an allergy to CHG or antibacterial soaps.  If your skin becomes reddened/irritated stop using the CHG and inform your nurse when you arrive at Short Stay. Do not shave (including legs and underarms) for at least 48 hours prior to the first CHG shower.  You may shave your face/neck. Please follow these instructions carefully:  1.  Shower with CHG Soap the night before surgery and the  morning of Surgery.  2.  If you choose to wash your hair, wash your hair first as usual with your  normal  shampoo.  3.  After you shampoo, rinse your hair and body thoroughly to remove the  shampoo.                                        4.  Use CHG as you would any other liquid soap.  You can apply chg directly  to the skin and wash                       Gently with a scrungie or clean washcloth.  5.  Apply the CHG Soap to your body ONLY FROM THE NECK DOWN.   Do not use on face/ open                           Wound or open sores. Avoid contact with eyes, ears mouth and genitals (private parts).                       Wash face,  Genitals (private parts) with your normal soap.             6.  Wash thoroughly, paying special attention to  the area where your surgery  will be performed.  7.  Thoroughly rinse your body with warm water from the neck down.  8.  DO NOT shower/wash with your normal soap after using and rinsing off  the CHG Soap.             9.  Pat yourself dry with a clean towel.            10.  Wear clean pajamas.            11.  Place clean sheets on your bed the night of your first shower and do not  sleep with pets. Day of Surgery : Do not apply any lotions/deodorants the morning of surgery.  Please wear clean clothes to the hospital/surgery center.  FAILURE TO FOLLOW THESE INSTRUCTIONS MAY RESULT IN THE CANCELLATION OF YOUR SURGERY PATIENT SIGNATURE_________________________________  NURSE SIGNATURE__________________________________  ________________________________________________________________________   [name]  An incentive spirometer is a tool that can help keep your lungs clear and active. This tool measures how well you are filling your lungs with each breath. Taking long deep breaths may help reverse or decrease the chance of developing breathing (pulmonary) problems (especially infection) following:  A long period of time when you are unable to move or be active. BEFORE THE PROCEDURE   If the spirometer includes an indicator to show your best effort, your nurse or respiratory therapist will set it to a desired goal.  If possible, sit up straight or lean slightly forward. Try not to slouch.  Hold the incentive spirometer in  an upright position. INSTRUCTIONS FOR USE  1. Sit on the edge of your bed if possible, or sit up as far as you can in bed or on a chair. 2. Hold the incentive spirometer in an upright position. 3. Breathe out normally. 4. Place the mouthpiece in your mouth and seal your lips tightly around it. 5. Breathe in slowly and as deeply as possible, raising the piston or the ball toward the top of the column. 6. Hold your breath for 3-5 seconds or for as long as  possible. Allow the piston or ball to fall to the bottom of the column. 7. Remove the mouthpiece from your mouth and breathe out normally. 8. Rest for a few seconds and repeat Steps 1 through 7 at least 10 times every 1-2 hours when you are awake. Take your time and take a few normal breaths between deep breaths. 9. The spirometer may include an indicator to show your best effort. Use the indicator as a goal to work toward during each repetition. 10. After each set of 10 deep breaths, practice coughing to be sure your lungs are clear. If you have an incision (the cut made at the time of surgery), support your incision when coughing by placing a pillow or rolled up towels firmly against it. Once you are able to get out of bed, walk around indoors and cough well. You may stop using the incentive spirometer when instructed by your caregiver.  RISKS AND COMPLICATIONS  Take your time so you do not get dizzy or light-headed.  If you are in pain, you may need to take or ask for pain medication before doing incentive spirometry. It is harder to take a deep breath if you are having pain. AFTER USE  Rest and breathe slowly and easily.  It can be helpful to keep track of a log of your progress. Your caregiver can provide you with a simple table to help with this. If you are using the spirometer at home, follow these instructions: SEEK MEDICAL CARE IF:   You are having difficultly using the spirometer.  You have trouble using the spirometer as often as instructed.  Your pain medication is not giving enough relief while using the spirometer.  You develop fever of 100.5 F (38.1 C) or higher. SEEK IMMEDIATE MEDICAL CARE IF:   You cough up bloody sputum that had not been present before.  You develop fever of 102 F (38.9 C) or greater.  You develop worsening pain at or near the incision site. MAKE SURE YOU:   Understand these instructions.  Will watch your condition.  Will get help right  away if you are not doing well or get worse. Document Released: 04/16/2007 Document Revised: 02/26/2012 Document Reviewed: 06/17/2007 [facility] Patient Information 2014 [place], [place].   ________________________________________________________________________

## 2021-03-04 ENCOUNTER — Encounter (HOSPITAL_COMMUNITY)
Admission: RE | Admit: 2021-03-04 | Discharge: 2021-03-04 | Disposition: A | Discharge status: Home / Self Care | Payer: Medicaid Other | Source: Ambulatory Visit

## 2021-03-04 NOTE — Progress Notes (Signed)
Pt missed the PAT appointment. All her contact numbers were called and a message was left. Dr. Greig Right office was notified.

## 2021-03-07 NOTE — Progress Notes (Signed)
DUE TO COVID-19 ONLY ONE VISITOR IS ALLOWED TO COME WITH YOU AND STAY IN THE WAITING ROOM ONLY DURING PRE OP AND PROCEDURE DAY OF SURGERY. THE 1 VISITOR  MAY VISIT WITH YOU AFTER SURGERY IN YOUR PRIVATE ROOM DURING VISITING HOURS ONLY!  YOU NEED TO HAVE A COVID 19 TEST ON__33/25/2022 _____ @_______ , THIS TEST MUST BE DONE BEFORE SURGERY,  COVID TESTING SITE [address] [place] , IT IS ON THE RIGHT GOING OUT [address] APPROXIMATELY  2 MINUTES PAST [employer] ON THE RIGHT. ONCE YOUR COVID TEST IS COMPLETED,  PLEASE BEGIN THE QUARANTINE INSTRUCTIONS AS OUTLINED IN YOUR HANDOUT.                [name]  03/07/2021   Your procedure is scheduled on:  03/15/2021   Report to [facility] Main  Entrance   Report to admitting at    0530am  AM     Call this number if you have problems the morning of surgery [phone]    REMEMBER: NO  SOLID FOOD CANDY OR GUM AFTER MIDNIGHT. CLEAR LIQUIDS UNTIL    0430am     . NOTHING BY MOUTH EXCEPT CLEAR LIQUIDS UNTIL     0430am    . PLEASE FINISH ENSURE DRINK PER SURGEON ORDER  WHICH NEEDS TO BE COMPLETED AT 0430am    .      CLEAR LIQUID DIET   Foods Allowed                                                                    Coffee and tea, regular and decaf                            Fruit ices (not with fruit pulp)                                      Iced Popsicles                                    Carbonated beverages, regular and diet                                    Cranberry, grape and apple juices Sports drinks like Gatorade Lightly seasoned clear broth or consume(fat free) Sugar, honey syrup ___________________________________________________________________      BRUSH YOUR TEETH MORNING OF SURGERY AND RINSE YOUR MOUTH OUT, NO CHEWING GUM CANDY OR MINTS.     Take these medicines the morning of surgery with A SIP OF WATER:   DO NOT TAKE ANY DIABETIC MEDICATIONS DAY OF YOUR SURGERY                                You may not have any metal on your body including hair pins and              piercings  Do not wear jewelry, make-up, lotions, powders or perfumes, deodorant             Do not wear nail polish on your fingernails.  Do not shave  48 hours prior to surgery.              Men may shave face and neck.   Do not bring valuables to the hospital. [place].  Contacts, dentures or bridgework may not be worn into surgery.  Leave suitcase in the car. After surgery it may be brought to your room.     Patients discharged the day of surgery will not be allowed to drive home. IF YOU ARE HAVING SURGERY AND GOING HOME THE SAME DAY, YOU MUST HAVE AN ADULT TO DRIVE YOU HOME AND BE WITH YOU FOR 24 HOURS. YOU MAY GO HOME BY TAXI OR UBER OR ORTHERWISE, BUT AN ADULT MUST ACCOMPANY YOU HOME AND STAY WITH YOU FOR 24 HOURS.  Name and phone number of your driver:  Special Instructions: N/A              Please read over the following fact sheets you were given: _____________________________________________________________________  [facility] - Preparing for Surgery Before surgery, you can play an important role.  Because skin is not sterile, your skin needs to be as free of germs as possible.  You can reduce the number of germs on your skin by washing with CHG (chlorahexidine gluconate) soap before surgery.  CHG is an antiseptic cleaner which kills germs and bonds with the skin to continue killing germs even after washing. Please DO NOT use if you have an allergy to CHG or antibacterial soaps.  If your skin becomes reddened/irritated stop using the CHG and inform your nurse when you arrive at Short Stay. Do not shave (including legs and underarms) for at least 48 hours prior to the first CHG shower.  You may shave your face/neck. Please follow these instructions carefully:  1.  Shower with CHG Soap the night before surgery and the  morning of  Surgery.  2.  If you choose to wash your hair, wash your hair first as usual with your  normal  shampoo.  3.  After you shampoo, rinse your hair and body thoroughly to remove the  shampoo.                           4.  Use CHG as you would any other liquid soap.  You can apply chg directly  to the skin and wash                       Gently with a scrungie or clean washcloth.  5.  Apply the CHG Soap to your body ONLY FROM THE NECK DOWN.   Do not use on face/ open                           Wound or open sores. Avoid contact with eyes, ears mouth and genitals (private parts).                       Wash face,  Genitals (private parts) with your normal soap.             6.  Wash  thoroughly, paying special attention to the area where your surgery  will be performed.  7.  Thoroughly rinse your body with warm water from the neck down.  8.  DO NOT shower/wash with your normal soap after using and rinsing off  the CHG Soap.                9.  Pat yourself dry with a clean towel.            10.  Wear clean pajamas.            11.  Place clean sheets on your bed the night of your first shower and do not  sleep with pets. Day of Surgery : Do not apply any lotions/deodorants the morning of surgery.  Please wear clean clothes to the hospital/surgery center.  FAILURE TO FOLLOW THESE INSTRUCTIONS MAY RESULT IN THE CANCELLATION OF YOUR SURGERY PATIENT SIGNATURE_________________________________  NURSE SIGNATURE__________________________________  ________________________________________________________________________

## 2021-03-09 ENCOUNTER — Inpatient Hospital Stay (HOSPITAL_COMMUNITY)
Admission: RE | Admit: 2021-03-09 | Discharge: 2021-03-09 | Disposition: A | Discharge status: Home / Self Care | Payer: Medicaid Other | Source: Ambulatory Visit

## 2021-03-10 ENCOUNTER — Other Ambulatory Visit: Payer: Self-pay

## 2021-03-10 ENCOUNTER — Encounter (HOSPITAL_COMMUNITY)
Admission: RE | Admit: 2021-03-10 | Discharge: 2021-03-10 | Disposition: A | Discharge status: Home / Self Care | Payer: Medicaid Other | Source: Ambulatory Visit | Attending: Orthopedic Surgery | Admitting: Orthopedic Surgery

## 2021-03-10 ENCOUNTER — Encounter (HOSPITAL_COMMUNITY): Payer: Self-pay

## 2021-03-10 DIAGNOSIS — Z01812 Encounter for preprocedural laboratory examination: Secondary | ICD-10-CM | POA: Insufficient documentation

## 2021-03-10 HISTORY — DX: Hypothyroidism, unspecified: E03.9

## 2021-03-10 HISTORY — DX: Unspecified osteoarthritis, unspecified site: M19.90

## 2021-03-10 LAB — CBC
HCT: 36.8 % (ref 36.0–46.0)
Hemoglobin: 11.3 g/dL — ABNORMAL LOW (ref 12.0–15.0)
MCH: 23.5 pg — ABNORMAL LOW (ref 26.0–34.0)
MCHC: 30.7 g/dL (ref 30.0–36.0)
MCV: 76.7 fL — ABNORMAL LOW (ref 80.0–100.0)
Platelets: 313 10*3/uL (ref 150–400)
RBC: 4.8 MIL/uL (ref 3.87–5.11)
RDW: 13.2 % (ref 11.5–15.5)
WBC: 9.5 10*3/uL (ref 4.0–10.5)
nRBC: 0 % (ref 0.0–0.2)

## 2021-03-10 LAB — BASIC METABOLIC PANEL
Anion gap: 10 (ref 5–15)
BUN: 15 mg/dL (ref 6–20)
CO2: 26 mmol/L (ref 22–32)
Calcium: 9.8 mg/dL (ref 8.9–10.3)
Chloride: 101 mmol/L (ref 98–111)
Creatinine, Ser: 0.73 mg/dL (ref 0.44–1.00)
GFR, Estimated: >60 mL/min (ref >60)
Glucose, Bld: 103 mg/dL — ABNORMAL HIGH (ref 70–99)
Potassium: 3.2 mmol/L — ABNORMAL LOW (ref 3.5–5.1)
Sodium: 137 mmol/L (ref 135–145)

## 2021-03-10 LAB — SURGICAL PCR SCREEN
MRSA, PCR: NEGATIVE
Staphylococcus aureus: NEGATIVE

## 2021-03-10 NOTE — H&P (Signed)
KNEE ARTHROPLASTY ADMISSION H&P  Patient ID: [name] MRN: [id number] DOB/AGE: 1961/06/23 60 y.o.  Chief Complaint: right knee pain.  Planned Procedure Date: 03/15/21 Medical Clearance by Norva Riffle PA-C     HPI: [name] is a 60 y.o. female who presents for evaluation of djd right knee. The patient has a history of pain and functional disability in the right knee due to arthritis and has failed non-surgical conservative treatments for greater than 12 weeks to include NSAID's and/or analgesics, corticosteriod injections, use of assistive devices and activity modification.  Onset of symptoms was gradual, starting >10 years ago with gradually worsening course since that time. The patient noted no past surgery on the right knee.  Patient currently rates pain at 10 out of 10 with activity. Patient has night pain, worsening of pain with activity and weight bearing and pain that interferes with activities of daily living.  Patient has evidence of subchondral sclerosis, periarticular osteophytes and joint space narrowing by imaging studies.  There is no active infection.  Past Medical History:  Diagnosis Date  . Arthritis   . Asthma   . GERD (gastroesophageal reflux disease)    pt denies   . Hyperlipemia 11/07/2011   . Hypothyroidism   . OSA on CPAP   . Thyroid disease    Past Surgical History:  Procedure Laterality Date  . ABDOMINAL HYSTERECTOMY    . CHOLECYSTECTOMY     No Known Allergies Prior to Admission medications   Medication Sig Start Date End Date Taking? Authorizing Provider  atorvastatin (LIPITOR) 40 MG tablet Take 40 mg by mouth at bedtime.    Yes [provider]  fluticasone (FLONASE) 50 MCG/ACT nasal spray Place 2 sprays into both nostrils daily as needed for allergies or rhinitis.   Yes [provider]  hydrochlorothiazide (HYDRODIURIL) 25 MG tablet Take 25 mg by mouth daily. 02/18/21  Yes [provider]  levothyroxine  (SYNTHROID) 50 MCG tablet Take 1 tablet (50 mcg total) by mouth daily before breakfast. Patient taking differently: Take 100 mcg by mouth daily before breakfast. 10/09/20  Yes Vann, Jessica U, DO  omeprazole (PRILOSEC) 20 MG capsule Take 1 capsule (20 mg total) by mouth daily. 10/08/20  Yes Joseph Art, DO  PRESCRIPTION MEDICATION See admin instructions. CPAP- At bedtime   Yes [provider]  PROAIR HFA 108 (90 Base) MCG/ACT inhaler Inhale 2 puffs into the lungs in the morning, at noon, in the evening, and at bedtime.   Yes [provider]  ferrous sulfate 325 (65 FE) MG tablet Take 1 tablet (325 mg total) by mouth daily. Patient not taking: No sig reported 12/16/14   Doug Sou, MD  sucralfate (CARAFATE) 1 GM/10ML suspension Take 10 mLs (1 g total) by mouth 4 (four) times daily -  with meals and at bedtime. Patient not taking: No sig reported 10/08/20   Joseph Art, DO  SYMBICORT 80-4.5 MCG/ACT inhaler Inhale 2 puffs into the lungs 2 (two) times daily. 02/21/21   [provider]   Social History   Socioeconomic History  . Marital status: Single    Spouse name: Not on file  . Number of children: Not on file  . Years of education: Not on file  . Highest education level: Not on file  Occupational History  . Not on file  Tobacco Use  . Smoking status: Never Smoker  . Smokeless tobacco: Never Used  Vaping Use  . Vaping Use: Never used  Substance  and Sexual Activity  . Alcohol use: No  . Drug use: No  . Sexual activity: Not Currently  Other Topics Concern  . Not on file  Social History Narrative   ** Merged History Encounter **       Social Determinants of Health   Financial Resource Strain: Not on file  Food Insecurity: Not on file  Transportation Needs: Not on file  Physical Activity: Not on file  Stress: Not on file  Social Connections: Not on file   Family History  Problem Relation Age of Onset  . Heart failure Mother   . Diabetes  Mother     ROS: Currently denies lightheadedness, dizziness, Fever, chills, N/V, CP, SOB.   No personal history of DVT, PE, MI, or CVA. Has dentures.  All other systems have been reviewed and were otherwise currently negative with the exception of those mentioned in the HPI and as above.  Objective: Vitals: Ht: 4'9" Wt: 188.8 lbs Temp: 97.6 BP: 118/72 Pulse: 86 O2 98% on room air.   Physical Exam: General: A&O x 4, NAD.  Antalgic Gait  HEENT: EOMI, Good Neck Extension. Trachea midline. No pharynx erythema Pulm: No increased work of breathing.  Clear B/L A/P w/o crackle or wheeze.  CV: RRR, No m/g/r appreciated  GI: soft, NT, ND. BS in all 4 quadrants Neuro: Cranial nerves II-XII grossly intact without focal deficit.  Sensation intact distally Skin: No lesions in the area of chief complaint MSK/Surgical Site: Right knee w/o redness or effusion.  + JLT. ROM 30-90.  Decreased strength. +EHL/FHL.  NVI.     Imaging Review Plain radiographs demonstrate severe degenerative joint disease of the bilateral knee.   The overall alignment isneutral. The bone quality appears to be fair for age and reported activity level.  Preoperative templating of the joint replacement has been completed, documented, and submitted to the Operating Room personnel in order to optimize intra-operative equipment management.  Assessment: djd right knee Active Problems:   Osteoarthritis of right knee   Plan: Plan for Procedure(s): TOTAL KNEE ARTHROPLASTY  The patient history, physical exam, clinical judgement of the provider and imaging are consistent with end stage degenerative joint disease and total joint arthroplasty is deemed medically necessary. The treatment options including medical management, injection therapy, and arthroplasty were discussed at length. The risks and benefits of Procedure(s): TOTAL KNEE ARTHROPLASTY were presented and reviewed.  The risks of nonoperative treatment, versus surgical  intervention including but not limited to continued pain, aseptic loosening, stiffness, dislocation/subluxation, infection, bleeding, nerve injury, blood clots, cardiopulmonary complications, morbidity, mortality, among others were discussed. The patient verbalizes understanding and wishes to proceed with the plan.   Patient is being admitted to [facility] for inpatient treatment for surgery, pain control, PT, prophylactic antibiotics, VTE prophylaxis, progressive ambulation, ADL's and discharge planning.   Dental prophylaxis discussed and recommended for 2 years postoperatively.   The patient does meet the criteria for TXA which will be used perioperatively.    ASA 81 mg BID will be used postoperatively for DVT prophylaxis in addition to SCDs, and early ambulation.  Plan for Tylenol, Mobic, oxycodone for pain.    Robaxin for muscle spasms.    Zofran for nausea and vomiting.   She is already taking Omeprazole for gastric protection.   The patient is planning to be discharged home with OPPT in care of her daughter Ashok Cordia who can be reached at [phone].   Follow up appointment is 03/23/21 at 11am.  Lab work  has been drawn.     Jenne Pane, [place] Office [phone] 03/10/2021 6:13 PM

## 2021-03-10 NOTE — Progress Notes (Addendum)
Anesthesia Review:  PCP: Landis Gandy , PA with [facility]- [phone] Have requested LOV note pt reported she had to see for clearance for surgery  Rerequested on 03/11/21 the clearance for surgery and office visit note.  Cardiologist : Chest x-ray : 1 V- 10/04/20  EKG :09/2020  Echo : Stress test: Cardiac Cath :  Activity level: can do a flight of stairs without difficulty  Sleep Study/ CPAP : yes  Fasting Blood Sugar :      / Checks Blood Sugar -- times a day:   Blood Thinner/ Instructions /Last Dose: ASA / Instructions/ Last Dose :  BMP done 03/10/21 routed to DR Margarita Rana

## 2021-03-11 ENCOUNTER — Other Ambulatory Visit (HOSPITAL_COMMUNITY)
Admission: RE | Admit: 2021-03-11 | Discharge: 2021-03-11 | Disposition: A | Discharge status: Home / Self Care | Payer: Medicaid Other | Source: Ambulatory Visit | Attending: Orthopedic Surgery | Admitting: Orthopedic Surgery

## 2021-03-11 DIAGNOSIS — Z01812 Encounter for preprocedural laboratory examination: Secondary | ICD-10-CM | POA: Diagnosis not present

## 2021-03-11 DIAGNOSIS — Z20822 Contact with and (suspected) exposure to covid-19: Secondary | ICD-10-CM | POA: Insufficient documentation

## 2021-03-11 LAB — SARS CORONAVIRUS 2 (TAT 6-24 HRS): SARS Coronavirus 2: NEGATIVE

## 2021-03-14 NOTE — Anesthesia Preprocedure Evaluation (Addendum)
Anesthesia Evaluation  Patient identified by MRN, date of birth, ID band Patient awake    Reviewed: Allergy & Precautions, NPO status , Patient's Chart, lab work & pertinent test results  Airway Mallampati: III  TM Distance: >3 FB Neck ROM: Full    Dental  (+) Edentulous Upper, Edentulous Lower   Pulmonary asthma , sleep apnea and Continuous Positive Airway Pressure Ventilation ,    Pulmonary exam normal breath sounds clear to auscultation       Cardiovascular negative cardio ROS Normal cardiovascular exam Rhythm:Regular Rate:Normal  ECG: NSR, rate 79   Neuro/Psych negative neurological ROS  negative psych ROS   GI/Hepatic Neg liver ROS,   Endo/Other  Hypothyroidism   Renal/GU negative Renal ROS     Musculoskeletal  (+) Arthritis ,   Abdominal (+) + obese,   Peds  Hematology HLD   Anesthesia Other Findings djd right knee  Reproductive/Obstetrics                            Anesthesia Physical Anesthesia Plan  ASA: III  Anesthesia Plan: Spinal and Regional   Post-op Pain Management:  Regional for Post-op pain   Induction:   PONV Risk Score and Plan: 2 and Ondansetron, Dexamethasone, Midazolam, Propofol infusion and Treatment may vary due to age or medical condition  Airway Management Planned: Simple Face Mask  Additional Equipment:   Intra-op Plan:   Post-operative Plan:   Informed Consent: I have reviewed the patients History and Physical, chart, labs and discussed the procedure including the risks, benefits and alternatives for the proposed anesthesia with the patient or authorized representative who has indicated his/her understanding and acceptance.       Plan Discussed with: CRNA  Anesthesia Plan Comments:         Anesthesia Quick Evaluation

## 2021-03-15 ENCOUNTER — Observation Stay (HOSPITAL_COMMUNITY)
Admission: RE | Admit: 2021-03-15 | Discharge: 2021-03-16 | Disposition: A | Discharge status: Home / Self Care | Payer: Medicaid Other | Attending: Orthopedic Surgery | Admitting: Orthopedic Surgery

## 2021-03-15 ENCOUNTER — Encounter (HOSPITAL_COMMUNITY): Payer: Self-pay | Admitting: Orthopedic Surgery

## 2021-03-15 ENCOUNTER — Ambulatory Visit (HOSPITAL_COMMUNITY): Payer: Medicaid Other

## 2021-03-15 ENCOUNTER — Encounter (HOSPITAL_COMMUNITY)
Admission: RE | Disposition: A | Discharge status: Home / Self Care | Payer: Self-pay | Source: Home / Self Care | Attending: Orthopedic Surgery

## 2021-03-15 ENCOUNTER — Ambulatory Visit (HOSPITAL_COMMUNITY): Payer: Medicaid Other | Admitting: Physician Assistant

## 2021-03-15 ENCOUNTER — Ambulatory Visit (HOSPITAL_COMMUNITY): Payer: Medicaid Other | Admitting: Anesthesiology

## 2021-03-15 ENCOUNTER — Other Ambulatory Visit: Payer: Self-pay

## 2021-03-15 DIAGNOSIS — Z79899 Other long term (current) drug therapy: Secondary | ICD-10-CM | POA: Insufficient documentation

## 2021-03-15 DIAGNOSIS — M1711 Unilateral primary osteoarthritis, right knee: Secondary | ICD-10-CM | POA: Diagnosis present

## 2021-03-15 DIAGNOSIS — J45909 Unspecified asthma, uncomplicated: Secondary | ICD-10-CM | POA: Insufficient documentation

## 2021-03-15 DIAGNOSIS — Z9989 Dependence on other enabling machines and devices: Secondary | ICD-10-CM | POA: Diagnosis not present

## 2021-03-15 DIAGNOSIS — E039 Hypothyroidism, unspecified: Secondary | ICD-10-CM | POA: Diagnosis not present

## 2021-03-15 DIAGNOSIS — Z96651 Presence of right artificial knee joint: Secondary | ICD-10-CM

## 2021-03-15 HISTORY — PX: TOTAL KNEE ARTHROPLASTY: SHX125

## 2021-03-15 SURGERY — ARTHROPLASTY, KNEE, TOTAL
Anesthesia: Regional | Site: Knee | Laterality: Right

## 2021-03-15 MED ORDER — OXYCODONE HCL 5 MG PO TABS
5.0000 mg | ORAL_TABLET | Freq: Once | ORAL | Status: AC | PRN
Start: 2021-03-15 — End: 2021-03-15
  Administered 2021-03-15: 5 mg via ORAL

## 2021-03-15 MED ORDER — HYDROCHLOROTHIAZIDE 25 MG PO TABS
25.0000 mg | ORAL_TABLET | Freq: Every day | ORAL | Status: DC
  Administered 2021-03-15: 25 mg via ORAL
  Filled 2021-03-15: qty 1

## 2021-03-15 MED ORDER — FENTANYL CITRATE (PF) 100 MCG/2ML IJ SOLN
INTRAMUSCULAR | Status: AC
  Filled 2021-03-15: qty 2

## 2021-03-15 MED ORDER — PROPOFOL 500 MG/50ML IV EMUL
INTRAVENOUS | Status: DC | PRN
  Administered 2021-03-15: 25 ug/kg/min via INTRAVENOUS

## 2021-03-15 MED ORDER — METHOCARBAMOL 500 MG PO TABS: 500.0000 mg | ORAL_TABLET | Freq: Three times a day (TID) | ORAL | 0 refills | Status: DC | PRN

## 2021-03-15 MED ORDER — KETAMINE HCL 10 MG/ML IJ SOLN
INTRAMUSCULAR | Status: AC
  Filled 2021-03-15: qty 1

## 2021-03-15 MED ORDER — BUPIVACAINE LIPOSOME 1.3 % IJ SUSP
20.0000 mL | Freq: Once | INTRAMUSCULAR | Status: AC
  Administered 2021-03-15: 20 mL
  Filled 2021-03-15: qty 20

## 2021-03-15 MED ORDER — ESMOLOL HCL 100 MG/10ML IV SOLN
INTRAVENOUS | Status: DC | PRN
  Administered 2021-03-15 (×2): 25 mg via INTRAVENOUS

## 2021-03-15 MED ORDER — TRANEXAMIC ACID-NACL 1000-0.7 MG/100ML-% IV SOLN
1000.0000 mg | INTRAVENOUS | Status: AC
  Administered 2021-03-15: 1000 mg via INTRAVENOUS
  Filled 2021-03-15: qty 100

## 2021-03-15 MED ORDER — DEXAMETHASONE SODIUM PHOSPHATE 10 MG/ML IJ SOLN
8.0000 mg | Freq: Once | INTRAMUSCULAR | Status: AC
  Administered 2021-03-15: 8 mg via INTRAVENOUS

## 2021-03-15 MED ORDER — MIDAZOLAM HCL 5 MG/5ML IJ SOLN
INTRAMUSCULAR | Status: DC | PRN
  Administered 2021-03-15 (×2): 1 mg via INTRAVENOUS

## 2021-03-15 MED ORDER — 0.9 % SODIUM CHLORIDE (POUR BTL) OPTIME
TOPICAL | Status: DC | PRN
  Administered 2021-03-15: 1000 mL

## 2021-03-15 MED ORDER — BISACODYL 10 MG RE SUPP: 10.0000 mg | Freq: Every day | RECTAL | Status: DC | PRN

## 2021-03-15 MED ORDER — DEXAMETHASONE SODIUM PHOSPHATE 10 MG/ML IJ SOLN
10.0000 mg | Freq: Once | INTRAMUSCULAR | Status: AC
  Administered 2021-03-16: 10 mg via INTRAVENOUS
  Filled 2021-03-15: qty 1

## 2021-03-15 MED ORDER — MIDAZOLAM HCL 2 MG/2ML IJ SOLN
INTRAMUSCULAR | Status: AC
  Filled 2021-03-15: qty 2

## 2021-03-15 MED ORDER — FENTANYL CITRATE (PF) 100 MCG/2ML IJ SOLN
25.0000 ug | INTRAMUSCULAR | Status: DC | PRN
  Administered 2021-03-15 (×2): 50 ug via INTRAVENOUS

## 2021-03-15 MED ORDER — LIDOCAINE HCL (CARDIAC) PF 100 MG/5ML IV SOSY
PREFILLED_SYRINGE | INTRAVENOUS | Status: DC | PRN
  Administered 2021-03-15: 50 mg via INTRAVENOUS
  Administered 2021-03-15: 40 mg via INTRAVENOUS

## 2021-03-15 MED ORDER — TRAMADOL HCL 50 MG PO TABS
50.0000 mg | ORAL_TABLET | Freq: Four times a day (QID) | ORAL | Status: DC
  Administered 2021-03-15 – 2021-03-16 (×2): 50 mg via ORAL
  Filled 2021-03-15 (×2): qty 1

## 2021-03-15 MED ORDER — OXYCODONE HCL 5 MG PO TABS: 10.0000 mg | ORAL_TABLET | ORAL | Status: DC | PRN

## 2021-03-15 MED ORDER — OXYCODONE HCL 5 MG PO TABS
5.0000 mg | ORAL_TABLET | ORAL | Status: DC | PRN
  Administered 2021-03-15: 10 mg via ORAL
  Administered 2021-03-16: 5 mg via ORAL
  Filled 2021-03-15: qty 1
  Filled 2021-03-15: qty 2

## 2021-03-15 MED ORDER — LACTATED RINGERS IV BOLUS: 250.0000 mL | Freq: Once | INTRAVENOUS | Status: DC

## 2021-03-15 MED ORDER — LEVOTHYROXINE SODIUM 100 MCG PO TABS
100.0000 ug | ORAL_TABLET | Freq: Every day | ORAL | Status: DC
  Administered 2021-03-16: 100 ug via ORAL
  Filled 2021-03-15: qty 1

## 2021-03-15 MED ORDER — PHENYLEPHRINE HCL (PRESSORS) 10 MG/ML IV SOLN
INTRAVENOUS | Status: DC | PRN
  Administered 2021-03-15: 40 ug via INTRAVENOUS
  Administered 2021-03-15 (×4): 80 ug via INTRAVENOUS
  Administered 2021-03-15: 60 ug via INTRAVENOUS

## 2021-03-15 MED ORDER — PHENOL 1.4 % MT LIQD: 1.0000 | OROMUCOSAL | Status: DC | PRN

## 2021-03-15 MED ORDER — ORAL CARE MOUTH RINSE: 15.0000 mL | Freq: Once | OROMUCOSAL | Status: AC

## 2021-03-15 MED ORDER — DEXAMETHASONE SODIUM PHOSPHATE 10 MG/ML IJ SOLN
INTRAMUSCULAR | Status: AC
  Filled 2021-03-15: qty 1

## 2021-03-15 MED ORDER — LACTATED RINGERS IV BOLUS
500.0000 mL | Freq: Once | INTRAVENOUS | Status: AC
  Administered 2021-03-15: 500 mL via INTRAVENOUS

## 2021-03-15 MED ORDER — ACETAMINOPHEN 500 MG PO TABS: 500.0000 mg | ORAL_TABLET | Freq: Four times a day (QID) | ORAL | 0 refills | Status: DC | PRN

## 2021-03-15 MED ORDER — BUPIVACAINE IN DEXTROSE 0.75-8.25 % IT SOLN
INTRATHECAL | Status: DC | PRN
  Administered 2021-03-15: 1.4 mL via INTRATHECAL

## 2021-03-15 MED ORDER — ONDANSETRON HCL 4 MG PO TABS: 4.0000 mg | ORAL_TABLET | Freq: Four times a day (QID) | ORAL | Status: DC | PRN

## 2021-03-15 MED ORDER — DIPHENHYDRAMINE HCL 12.5 MG/5ML PO ELIX
12.5000 mg | ORAL_SOLUTION | ORAL | Status: DC | PRN
Start: 2021-03-15 — End: 2021-03-16

## 2021-03-15 MED ORDER — ACETAMINOPHEN 325 MG PO TABS: 325.0000 mg | ORAL_TABLET | Freq: Four times a day (QID) | ORAL | Status: DC | PRN

## 2021-03-15 MED ORDER — POLYETHYLENE GLYCOL 3350 17 G PO PACK: 17.0000 g | PACK | Freq: Every day | ORAL | Status: DC | PRN

## 2021-03-15 MED ORDER — ACETAMINOPHEN 500 MG PO TABS
1000.0000 mg | ORAL_TABLET | Freq: Four times a day (QID) | ORAL | Status: AC
  Administered 2021-03-15 – 2021-03-16 (×3): 1000 mg via ORAL
  Filled 2021-03-15 (×3): qty 2

## 2021-03-15 MED ORDER — TRANEXAMIC ACID-NACL 1000-0.7 MG/100ML-% IV SOLN
1000.0000 mg | Freq: Once | INTRAVENOUS | Status: AC
  Administered 2021-03-15: 1000 mg via INTRAVENOUS
  Filled 2021-03-15: qty 100

## 2021-03-15 MED ORDER — KETAMINE HCL 10 MG/ML IJ SOLN
INTRAMUSCULAR | Status: DC | PRN
  Administered 2021-03-15: 20 mg via INTRAVENOUS

## 2021-03-15 MED ORDER — METOCLOPRAMIDE HCL 5 MG PO TABS: 5.0000 mg | ORAL_TABLET | Freq: Three times a day (TID) | ORAL | Status: DC | PRN

## 2021-03-15 MED ORDER — PROPOFOL 500 MG/50ML IV EMUL
INTRAVENOUS | Status: AC
  Filled 2021-03-15: qty 50

## 2021-03-15 MED ORDER — DOCUSATE SODIUM 100 MG PO CAPS
100.0000 mg | ORAL_CAPSULE | Freq: Two times a day (BID) | ORAL | Status: DC
  Administered 2021-03-15 – 2021-03-16 (×2): 100 mg via ORAL
  Filled 2021-03-15 (×2): qty 1

## 2021-03-15 MED ORDER — METOCLOPRAMIDE HCL 5 MG/ML IJ SOLN
5.0000 mg | Freq: Three times a day (TID) | INTRAMUSCULAR | Status: DC | PRN
  Administered 2021-03-15: 10 mg via INTRAVENOUS
  Filled 2021-03-15: qty 2

## 2021-03-15 MED ORDER — MAGNESIUM CITRATE PO SOLN: 1.0000 | Freq: Once | ORAL | Status: DC | PRN

## 2021-03-15 MED ORDER — PROPOFOL 10 MG/ML IV BOLUS
INTRAVENOUS | Status: DC | PRN
  Administered 2021-03-15 (×2): 50 mg via INTRAVENOUS
  Administered 2021-03-15: 100 mg via INTRAVENOUS

## 2021-03-15 MED ORDER — BUPIVACAINE LIPOSOME 1.3 % IJ SUSP
INTRAMUSCULAR | Status: DC | PRN
  Administered 2021-03-15: 20 mL

## 2021-03-15 MED ORDER — STERILE WATER FOR IRRIGATION IR SOLN
Status: DC | PRN
  Administered 2021-03-15: 2000 mL

## 2021-03-15 MED ORDER — HYDROMORPHONE HCL 1 MG/ML IJ SOLN
0.5000 mg | INTRAMUSCULAR | Status: DC | PRN
Start: 2021-03-15 — End: 2021-03-16
  Administered 2021-03-15: 0.5 mg via INTRAVENOUS
  Filled 2021-03-15: qty 1

## 2021-03-15 MED ORDER — ESMOLOL HCL 100 MG/10ML IV SOLN
INTRAVENOUS | Status: AC
  Filled 2021-03-15: qty 10

## 2021-03-15 MED ORDER — ACETAMINOPHEN 500 MG PO TABS
1000.0000 mg | ORAL_TABLET | Freq: Once | ORAL | Status: AC
  Administered 2021-03-15: 1000 mg via ORAL
  Filled 2021-03-15: qty 2

## 2021-03-15 MED ORDER — MELOXICAM 15 MG PO TABS: 15.0000 mg | ORAL_TABLET | Freq: Every day | ORAL | 0 refills | Status: DC

## 2021-03-15 MED ORDER — ASPIRIN EC 81 MG PO TBEC: 81.0000 mg | DELAYED_RELEASE_TABLET | Freq: Two times a day (BID) | ORAL | 0 refills | Status: DC

## 2021-03-15 MED ORDER — SODIUM CHLORIDE 0.9 % IR SOLN
Status: DC | PRN
  Administered 2021-03-15: 1000 mL

## 2021-03-15 MED ORDER — MENTHOL 3 MG MT LOZG: 1.0000 | LOZENGE | OROMUCOSAL | Status: DC | PRN

## 2021-03-15 MED ORDER — OXYCODONE HCL 5 MG PO TABS
ORAL_TABLET | ORAL | Status: AC
  Filled 2021-03-15: qty 1

## 2021-03-15 MED ORDER — ALUM & MAG HYDROXIDE-SIMETH 200-200-20 MG/5ML PO SUSP: 30.0000 mL | ORAL | Status: DC | PRN

## 2021-03-15 MED ORDER — LACTATED RINGERS IV SOLN: INTRAVENOUS | Status: DC

## 2021-03-15 MED ORDER — CHLORHEXIDINE GLUCONATE 0.12 % MT SOLN
15.0000 mL | Freq: Once | OROMUCOSAL | Status: AC
  Administered 2021-03-15: 15 mL via OROMUCOSAL

## 2021-03-15 MED ORDER — OXYCODONE HCL 5 MG PO TABS: 5.0000 mg | ORAL_TABLET | Freq: Three times a day (TID) | ORAL | 0 refills | Status: DC | PRN

## 2021-03-15 MED ORDER — BUPIVACAINE-EPINEPHRINE (PF) 0.5% -1:200000 IJ SOLN
INTRAMUSCULAR | Status: DC | PRN
  Administered 2021-03-15: 30 mL via PERINEURAL

## 2021-03-15 MED ORDER — ONDANSETRON HCL 4 MG/2ML IJ SOLN
INTRAMUSCULAR | Status: DC | PRN
  Administered 2021-03-15: 4 mg via INTRAVENOUS

## 2021-03-15 MED ORDER — PROMETHAZINE HCL 25 MG/ML IJ SOLN
6.2500 mg | INTRAMUSCULAR | Status: DC | PRN
Start: 2021-03-15 — End: 2021-03-15

## 2021-03-15 MED ORDER — PANTOPRAZOLE SODIUM 40 MG PO TBEC
40.0000 mg | DELAYED_RELEASE_TABLET | Freq: Every day | ORAL | Status: DC
  Administered 2021-03-15 – 2021-03-16 (×2): 40 mg via ORAL
  Filled 2021-03-15 (×2): qty 1

## 2021-03-15 MED ORDER — ONDANSETRON HCL 4 MG PO TABS: 4.0000 mg | ORAL_TABLET | Freq: Every day | ORAL | 0 refills | Status: DC | PRN

## 2021-03-15 MED ORDER — MELOXICAM 15 MG PO TABS
15.0000 mg | ORAL_TABLET | Freq: Once | ORAL | Status: AC
  Administered 2021-03-15: 15 mg via ORAL
  Filled 2021-03-15: qty 1

## 2021-03-15 MED ORDER — METHOCARBAMOL 500 MG PO TABS: 500.0000 mg | ORAL_TABLET | Freq: Four times a day (QID) | ORAL | Status: DC | PRN

## 2021-03-15 MED ORDER — METHOCARBAMOL 1000 MG/10ML IJ SOLN
500.0000 mg | Freq: Four times a day (QID) | INTRAVENOUS | Status: DC | PRN
  Filled 2021-03-15: qty 5

## 2021-03-15 MED ORDER — ASPIRIN 81 MG PO CHEW
81.0000 mg | CHEWABLE_TABLET | Freq: Two times a day (BID) | ORAL | Status: DC
  Administered 2021-03-15 – 2021-03-16 (×2): 81 mg via ORAL
  Filled 2021-03-15 (×2): qty 1

## 2021-03-15 MED ORDER — OXYCODONE HCL 5 MG/5ML PO SOLN
5.0000 mg | Freq: Once | ORAL | Status: AC | PRN
Start: 2021-03-15 — End: 2021-03-15

## 2021-03-15 MED ORDER — ATORVASTATIN CALCIUM 40 MG PO TABS
40.0000 mg | ORAL_TABLET | Freq: Every day | ORAL | Status: DC
  Administered 2021-03-15: 40 mg via ORAL
  Filled 2021-03-15: qty 1

## 2021-03-15 MED ORDER — AMISULPRIDE (ANTIEMETIC) 5 MG/2ML IV SOLN: 10.0000 mg | Freq: Once | INTRAVENOUS | Status: DC | PRN

## 2021-03-15 MED ORDER — SODIUM CHLORIDE 0.9% FLUSH
INTRAVENOUS | Status: DC | PRN
  Administered 2021-03-15: 50 mL

## 2021-03-15 MED ORDER — CEFAZOLIN SODIUM-DEXTROSE 2-4 GM/100ML-% IV SOLN
2.0000 g | Freq: Four times a day (QID) | INTRAVENOUS | Status: AC
  Administered 2021-03-15 (×2): 2 g via INTRAVENOUS
  Filled 2021-03-15 (×2): qty 100

## 2021-03-15 MED ORDER — CEFAZOLIN SODIUM-DEXTROSE 2-4 GM/100ML-% IV SOLN
2.0000 g | INTRAVENOUS | Status: AC
  Administered 2021-03-15: 2 g via INTRAVENOUS
  Filled 2021-03-15: qty 100

## 2021-03-15 MED ORDER — ONDANSETRON HCL 4 MG/2ML IJ SOLN: 4.0000 mg | Freq: Four times a day (QID) | INTRAMUSCULAR | Status: DC | PRN

## 2021-03-15 MED ORDER — FENTANYL CITRATE (PF) 100 MCG/2ML IJ SOLN
INTRAMUSCULAR | Status: DC | PRN
  Administered 2021-03-15 (×6): 25 ug via INTRAVENOUS
  Administered 2021-03-15: 50 ug via INTRAVENOUS

## 2021-03-15 SURGICAL SUPPLY — 48 items
BLADE HEX COATED 2.75 (ELECTRODE) ×2 IMPLANT
BLADE SAG 18X100X1.27 (BLADE) ×2 IMPLANT
BLADE SAGITTAL 25.0X1.37X90 (BLADE) ×2 IMPLANT
BLADE SURG 15 STRL LF DISP TIS (BLADE) ×1 IMPLANT
BLADE SURG 15 STRL SS (BLADE) ×2
BNDG CMPR MED 10X6 ELC LF (GAUZE/BANDAGES/DRESSINGS) ×1
BNDG ELASTIC 6X10 VLCR STRL LF (GAUZE/BANDAGES/DRESSINGS) ×1 IMPLANT
BOWL SMART MIX CTS (DISPOSABLE) IMPLANT
BSPLAT TIB 3 KN TRITANIUM (Knees) ×1 IMPLANT
CLSR STERI-STRIP ANTIMIC 1/2X4 (GAUZE/BANDAGES/DRESSINGS) ×1 IMPLANT
COMPONENT TRI CR RETAIN KNEE (Orthopedic Implant) IMPLANT
COVER SURGICAL LIGHT HANDLE (MISCELLANEOUS) ×2 IMPLANT
CUFF TOURN SGL QUICK 34 (TOURNIQUET CUFF) ×2
CUFF TRNQT CYL 34X4.125X (TOURNIQUET CUFF) ×1 IMPLANT
DRAPE U-SHAPE 47X51 STRL (DRAPES) ×2 IMPLANT
DRSG MEPILEX BORDER 4X12 (GAUZE/BANDAGES/DRESSINGS) ×1 IMPLANT
DURAPREP 26ML APPLICATOR (WOUND CARE) ×4 IMPLANT
GLOVE SRG 8 PF TXTR STRL LF DI (GLOVE) ×1 IMPLANT
GLOVE SURG ENC MOIS LTX SZ7.5 (GLOVE) ×2 IMPLANT
GLOVE SURG ENC TEXT LTX SZ7.5 (GLOVE) ×2 IMPLANT
GLOVE SURG UNDER POLY LF SZ7.5 (GLOVE) ×2 IMPLANT
GLOVE SURG UNDER POLY LF SZ8 (GLOVE) ×2
GOWN STRL REUS W/ TWL LRG LVL3 (GOWN DISPOSABLE) ×1 IMPLANT
GOWN STRL REUS W/TWL LRG LVL3 (GOWN DISPOSABLE) ×2
GOWN STRL REUS W/TWL XL LVL3 (GOWN DISPOSABLE) ×2 IMPLANT
HANDPIECE INTERPULSE COAX TIP (DISPOSABLE) ×2
HOLDER FOLEY CATH W/STRAP (MISCELLANEOUS) IMPLANT
IMMOBILIZER KNEE 20 (SOFTGOODS) ×2
IMMOBILIZER KNEE 20 THIGH 36 (SOFTGOODS) IMPLANT
INSERT TIB CS TRIATH X3 9 (Insert) ×1 IMPLANT
KIT TURNOVER KIT A (KITS) ×2 IMPLANT
KNEE PATELLA ASYMMETRIC 9X29 (Knees) ×1 IMPLANT
KNEE TIBIAL COMPONENT SZ3 (Knees) ×1 IMPLANT
MANIFOLD NEPTUNE II (INSTRUMENTS) ×2 IMPLANT
NS IRRIG 1000ML POUR BTL (IV SOLUTION) ×2 IMPLANT
PACK TOTAL KNEE CUSTOM (KITS) ×2 IMPLANT
PIN FLUTED HEDLESS FIX 3.5X1/8 (PIN) ×1 IMPLANT
PROTECTOR NERVE ULNAR (MISCELLANEOUS) ×2 IMPLANT
SET HNDPC FAN SPRY TIP SCT (DISPOSABLE) ×1 IMPLANT
SUT MNCRL AB 3-0 PS2 18 (SUTURE) ×2 IMPLANT
SUT VIC AB 0 CT1 36 (SUTURE) ×2 IMPLANT
SUT VIC AB 1 CT1 36 (SUTURE) ×4 IMPLANT
SUT VIC AB 2-0 CT1 27 (SUTURE) ×2
SUT VIC AB 2-0 CT1 TAPERPNT 27 (SUTURE) ×1 IMPLANT
TRAY FOLEY MTR SLVR 16FR STAT (SET/KITS/TRAYS/PACK) IMPLANT
TRIA CRUCIATE RETAIN KNEE (Orthopedic Implant) ×2 IMPLANT
TUBE SUCTION HIGH CAP CLEAR NV (SUCTIONS) ×2 IMPLANT
WRAP KNEE MAXI GEL POST OP (GAUZE/BANDAGES/DRESSINGS) ×1 IMPLANT

## 2021-03-15 NOTE — Plan of Care (Signed)
Discussed with patient and daughter about plan of care for post-op day 0.   Will continue to monitor patient.    [place], Charity fundraiser

## 2021-03-15 NOTE — Anesthesia Procedure Notes (Signed)
Spinal  Patient location during procedure: OR Start time: 03/15/2021 7:40 AM End time: 03/15/2021 7:45 AM Reason for block: surgical anesthesia Staffing Performed: anesthesiologist  Anesthesiologist: Leonides Grills, MD Preanesthetic Checklist Completed: patient identified, IV checked, risks and benefits discussed, surgical consent, monitors and equipment checked, pre-op evaluation and timeout performed Spinal Block Patient position: sitting Prep: DuraPrep Patient monitoring: cardiac monitor, continuous pulse ox and blood pressure Approach: midline Location: L4-5 Injection technique: single-shot Needle Needle type: Pencan  Needle gauge: 24 G Needle length: 9 cm Assessment Sensory level: T10 Events: CSF return and second provider Additional Notes Functioning IV was confirmed and monitors were applied. Sterile prep and drape, including hand hygiene and sterile gloves were used. The patient was positioned and the spine was prepped. The skin was anesthetized with lidocaine.  Previous unsuccessful attempts by CRNA. Free flow of clear CSF was obtained prior to injecting local anesthetic into the CSF.  The spinal needle aspirated freely following injection.  The needle was carefully withdrawn.  The patient tolerated the procedure well.

## 2021-03-15 NOTE — Transfer of Care (Signed)
Immediate Anesthesia Transfer of Care Note  Patient: [name]  Procedure(s) Performed: TOTAL KNEE ARTHROPLASTY (Right Knee)  Patient Location: PACU and ICU  Anesthesia Type:General and Spinal  Level of Consciousness: awake, alert , oriented and patient cooperative  Airway & Oxygen Therapy: Patient Spontanous Breathing and Patient connected to face mask oxygen  Post-op Assessment: Report given to RN and Post -op Vital signs reviewed and stable  Post vital signs: Reviewed and stable  Last Vitals:  Vitals Value Taken Time  BP    Temp    Pulse    Resp    SpO2      Last Pain:  Vitals:   03/15/21 0608  TempSrc:   PainSc: 7       Patients Stated Pain Goal: 6 (03/15/21 9528)  Complications: No complications documented.

## 2021-03-15 NOTE — Anesthesia Procedure Notes (Signed)
Anesthesia Regional Block: Adductor canal block   Pre-Anesthetic Checklist: ,, timeout performed, Correct Patient, Correct Site, Correct Laterality, Correct Procedure,, site marked, risks and benefits discussed, Surgical consent,  Pre-op evaluation,  At surgeon's request and post-op pain management  Laterality: Right  Prep: chloraprep       Needles:  Injection technique: Single-shot  Needle Type: Echogenic Stimulator Needle     Needle Length: 10cm  Needle Gauge: 20     Additional Needles:   Procedures:,,,, ultrasound used (permanent image in chart),,,,  Narrative:  Start time: 03/15/2021 6:55 AM End time: 03/15/2021 7:05 AM Injection made incrementally with aspirations every 5 mL.  Performed by: Personally  Anesthesiologist: Leonides Grills, MD  Additional Notes: Functioning IV was confirmed and monitors were applied. A time-out was performed. Hand hygiene and sterile gloves were used. The thigh was placed in a frog-leg position and prepped in a sterile fashion. A 20ga BBraun echogenic stimulator needle was placed using ultrasound guidance.  Negative aspiration and negative test dose prior to incremental administration of local anesthetic. The patient tolerated the procedure well.

## 2021-03-15 NOTE — Evaluation (Signed)
Physical Therapy Evaluation Patient Details Name: [name] MRN: [id number] DOB: 1961/01/29 Today's Date: 03/15/2021   History of Present Illness  61 y.o. female admitted for R TKA on 03/15/21; PMH includes OSA uses CPAP, hypothyroid  Clinical Impression  Pt is s/p TKA resulting in the deficits listed below (see PT Problem List). Min A bed mobility, min A to take a few pivotal steps from bed to recliner with RW, distance limited by lethargy. Good progress expected.  Pt will benefit from skilled PT to increase their independence and safety with mobility to allow discharge to the venue listed below.      Follow Up Recommendations Follow surgeon's recommendation for DC plan and follow-up therapies    Equipment Recommendations  Rolling walker with 5" wheels;3in1 (PT)    Recommendations for Other Services       Precautions / Restrictions Precautions Precautions: Fall;Knee Required Braces or Orthoses: Knee Immobilizer - Right Knee Immobilizer - Right: Discontinue once straight leg raise with < 10 degree lag Restrictions Weight Bearing Restrictions: No Other Position/Activity Restrictions: WBAT      Mobility  Bed Mobility Overal bed mobility: Needs Assistance Bed Mobility: Supine to Sit     Supine to sit: HOB elevated;Min assist     General bed mobility comments: min A to raise trunk and advance RLE to edge of bed    Transfers Overall transfer level: Needs assistance Equipment used: Rolling walker (2 wheeled) Transfers: Sit to/from Stand Sit to Stand: Min assist         General transfer comment: assist to rise, verbal cues for hand placement  Ambulation/Gait Ambulation/Gait assistance: Min assist Gait Distance (Feet): 3 Feet Assistive device: Rolling walker (2 wheeled) Gait Pattern/deviations: Step-to pattern;Decreased step length - right;Decreased step length - left Gait velocity: decr   General Gait Details: VCs sequencing, min A to maneuver RW,  distance limited by pt lethargy/fatigue, pt took several pivotal steps from bed to Best boy    Modified Rankin (Stroke Patients Only)       Balance Overall balance assessment: Needs assistance   Sitting balance-Leahy Scale: Good     Standing balance support: Bilateral upper extremity supported Standing balance-Leahy Scale: Poor                               Pertinent Vitals/Pain Pain Assessment: 0-10 Pain Score: 4  Pain Location: R knee Pain Descriptors / Indicators: Sore Pain Intervention(s): Limited activity within patient's tolerance;Monitored during session;Premedicated before session;Ice applied    Home Living Family/patient expects to be discharged to:: Private residence Living Arrangements: Children Available Help at Discharge: Family;Available 24 hours/day   Home Access: Stairs to enter Entrance Stairs-Rails: None Entrance Stairs-Number of Steps: 2 Home Layout: Two level Home Equipment: None Additional Comments: lives with daughter    Prior Function Level of Independence: Independent         Comments: denies falls in past 1 year     Hand Dominance        Extremity/Trunk Assessment   Upper Extremity Assessment Upper Extremity Assessment: Overall WFL for tasks assessed    Lower Extremity Assessment Lower Extremity Assessment: RLE deficits/detail RLE Deficits / Details: SLR 2/5, knee AAROM 5-45* RLE Sensation: WNL RLE Coordination: WNL    Cervical / Trunk Assessment Cervical / Trunk Assessment: Normal  Communication   Communication: No difficulties  Cognition Arousal/Alertness:  Lethargic;Suspect due to medications Behavior During Therapy: [facility] for tasks assessed/performed Overall Cognitive Status: Within Functional Limits for tasks assessed                                        General Comments      Exercises Total Joint Exercises Ankle Circles/Pumps:  AROM;Both;10 reps;Supine Heel Slides: AAROM;Right;5 reps;Supine   Assessment/Plan    PT Assessment Patient needs continued PT services  PT Problem List Decreased activity tolerance;Decreased range of motion;Decreased strength;Decreased mobility;Decreased knowledge of use of DME;Pain       PT Treatment Interventions DME instruction;Gait training;Therapeutic exercise;Patient/family education;Stair training    PT Goals (Current goals can be found in the Care Plan section)  Acute Rehab PT Goals Patient Stated Goal: to be able to go to church PT Goal Formulation: With patient Time For Goal Achievement: 03/22/21 Potential to Achieve Goals: Good    Frequency 7X/week   Barriers to discharge        Co-evaluation               AM-PAC PT "6 Clicks" Mobility  Outcome Measure Help needed turning from your back to your side while in a flat bed without using bedrails?: A Little Help needed moving from lying on your back to sitting on the side of a flat bed without using bedrails?: A Little Help needed moving to and from a bed to a chair (including a wheelchair)?: A Little Help needed standing up from a chair using your arms (e.g., wheelchair or bedside chair)?: A Little Help needed to walk in hospital room?: A Lot Help needed climbing 3-5 steps with a railing? : Total 6 Click Score: 15    End of Session Equipment Utilized During Treatment: Gait belt Activity Tolerance: Patient tolerated treatment well Patient left: in chair;with call bell/phone within reach;with chair alarm set;with family/visitor present Nurse Communication: Mobility status PT Visit Diagnosis: Muscle weakness (generalized) (M62.81);Difficulty in walking, not elsewhere classified (R26.2);Pain Pain - Right/Left: Right Pain - part of body: Knee    Time: 8250-0370 PT Time Calculation (min) (ACUTE ONLY): 31 min   Charges:   PT Evaluation $PT Eval Low Complexity: 1 Low PT Treatments $Therapeutic Activity: 8-22  mins        Ralene Bathe Kistler PT 03/15/2021  Acute Rehabilitation Services Pager [phone] Office [phone]

## 2021-03-15 NOTE — Op Note (Signed)
DATE OF SURGERY:  03/15/2021 TIME: 8:43 AM  PATIENT NAME:  [name]   AGE: 60 y.o.    PRE-OPERATIVE DIAGNOSIS:  djd right knee  POST-OPERATIVE DIAGNOSIS:  Same  PROCEDURE:  Procedure(s): TOTAL KNEE ARTHROPLASTY   SURGEON:  Sheral Apley, MD   ASSISTANT:  Levester Fresh, PA-C, he was present and scrubbed throughout the case, critical for completion in a timely fashion, and for retraction, instrumentation, and closure.    OPERATIVE IMPLANTS: [employer] Tri[employer]lon CR. Press fit knee  Femur size 3, Tibia size 3, Patella size 29 3-peg oval button, with a 9 mm polyethylene insert.   PREOPERATIVE INDICATIONS:  [name] is a 60 y.o. year old female with end stage bone on bone degenerative arthritis of the knee who failed conservative treatment, including injections, antiinflammatories, activity modification, and assistive devices, and had significant impairment of their activities of daily living, and elected for Total Knee Arthroplasty.   The risks, benefits, and alternatives were discussed at length including but not limited to the risks of infection, bleeding, nerve injury, stiffness, blood clots, the need for revision surgery, cardiopulmonary complications, among others, and they were willing to proceed.   OPERATIVE DESCRIPTION:  The patient was brought to the operative room and placed in a supine position.  General anesthesia was administered.  IV antibiotics were given.  The lower extremity was prepped and draped in the usual sterile fashion.  Time out was performed.  The leg was elevated and exsanguinated and the tourniquet was inflated.  Anterior approach was performed.  The patella was everted and osteophytes were removed.  The anterior horn of the medial and lateral meniscus was removed.   The distal femur was opened with the drill and the intramedullary distal femoral cutting jig was utilized, set at 5 degrees resecting 10 mm off the distal femur.  Care was  taken to protect the collateral ligaments.  The distal femoral sizing jig was applied, taking care to avoid notching.  Then the 4-in-1 cutting jig was applied and the anterior and posterior femur was cut, along with the chamfer cuts.  All posterior osteophytes were removed.  The flexion gap was then measured and was symmetric with the extension gap.  Then the extramedullary tibial cutting jig was utilized making the appropriate cut using the anterior tibial crest as a reference building in appropriate posterior slope.  Care was taken during the cut to protect the medial and collateral ligaments.  The proximal tibia was removed along with the posterior horns of the menisci.  The PCL was sacrificed.    The extensor gap was measured and was approximately 25mm.    I completed the distal femoral preparation using the appropriate jig to prepare the box.  The patella was then measured, and cut with the saw.    The proximal tibia sized and prepared accordingly with the reamer and the punch, and then all components were trialed with the above sized poly insert.  The knee was found to have excellent balance and full motion.    The above named components were then impacted into place and Poly tibial piece and patella were inserted.  I was very happy with his stability and ROM  I performed a periarticular injection with marcaine and toradol  The knee was easily taken through a range of motion and the patella tracked well and the knee irrigated copiously and the parapatellar and subcutaneous tissue closed with vicryl, and monocryl with steri strips for the skin.  The  incision was dressed with sterile gauze and the tourniquet released and the patient was awakened and returned to the PACU in stable and satisfactory condition.  There were no complications.  Total tourniquet time was roughly 70 minutes.   POSTOPERATIVE PLAN: post op Abx, DVT px: SCD's, TED's, Early ambulation and chemical px

## 2021-03-15 NOTE — OR PostOp (Incomplete)
PACU TO INPATIENT HANDOFF REPORT  Name/Age/Gender [name] 60 y.o. female  Code Status Code Status History    Date Active Date Inactive Code Status Order ID Comments User Context   10/04/2020 1434 10/08/2020 1731 Full Code [id number]  Jonah Blue, MD ED   Advance Care Planning Activity    Questions for Most Recent Historical Code Status (Order [id number])       Home/SNF/Other {Discharge Destination:18313::"Home"}  Chief Complaint S/P total knee arthroplasty, right [Z96.651]  Level of Care/Admitting Diagnosis ED Disposition    None      Medical History Past Medical History:  Diagnosis Date  . Arthritis   . Asthma   . GERD (gastroesophageal reflux disease)    pt denies   . Hyperlipemia 11/07/2011   . Hypothyroidism   . OSA on CPAP   . Thyroid disease     Allergies No Known Allergies  IV Location/Drains/Wounds Patient Lines/Drains/Airways Status    Active Line/Drains/Airways    Name Placement date Placement time Site Days   Peripheral IV 10/04/20 Left Antecubital 10/04/20  1009  Antecubital  162   Peripheral IV 03/15/21 Right Hand 03/15/21  0615  Hand  less than 1   Urethral Catheter Hawkins, RN Latex;Straight-tip 14 Fr. 03/15/21  0924  Latex;Straight-tip  less than 1   Airway 03/15/21  0807  - less than 1   Incision (Closed) 03/15/21 Knee Right 03/15/21  0841  - less than 1          Labs/Imaging No results found for this or any previous visit (from the past 48 hour(s)). DG Knee Right Port  Result Date: 03/15/2021 CLINICAL DATA:  Status post right knee replacement today. EXAM: PORTABLE RIGHT KNEE - 1-2 VIEW COMPARISON:  None. FINDINGS: Total knee arthroplasty is in place. The device is located. No fracture or other acute finding. Gas in the soft tissues from surgery noted. IMPRESSION: Status post right knee replacement.  No acute finding. Electronically Signed   By: Drusilla Kanner M.D.   On: 03/15/2021 11:01    Pending  Labs   Vitals/Pain Today's Vitals   03/15/21 1100 03/15/21 1115 03/15/21 1130 03/15/21 1145  BP: 97/60 97/62 98/68  95/66  Pulse: 84 87 89 87  Resp: 20 (!) 21 (!) 21 (!) 23  Temp:      TempSrc:      SpO2: 100% 100% 100% 100%  Weight:      Height:      PainSc: 5  4  4  3      Isolation Precautions @ISOLATION @  Administered Medications Periop Administered Meds from 03/15/2021 0526 to 03/15/2021 1158      Date/Time Order Dose Route Action Action by Comments    03/15/2021 0812 0.9 % irrigation (POUR BTL) 1,000 mL Irrigation Given 03/17/2021, MD     03/15/2021 [phone] acetaminophen (TYLENOL) tablet 1,000 mg 1,000 mg Oral Given Sheral Apley, RN     03/15/2021 0743 bupivacaine 0.75% in dextrose 8.25% (intrathecal) (SENSORCAINE) 0.75-8.25 % injection 1.4 mL Intrathecal Given Ellender, Azucena Kuba, MD     03/15/2021 0830 bupivacaine liposome (EXPAREL) 1.3 % injection 20 mL Infiltration Given 03-31-2000, MD     03/15/2021 0904 bupivacaine liposome (EXPAREL) 1.3 % injection 266 mg 20 mL Infiltration Given 03/17/2021, MD reminder 10 ml's injected    03/15/2021 0700 bupivacaine-epinephrine (MARCAINE W/ EPI) 0.5% -1:200000 injection 30 mL Peri-NEURAL Given Ellender, 03/17/2021, MD     03/15/2021 0723 ceFAZolin (ANCEF) IVPB 2g/100 mL  premix 2 g Intravenous Given Garth Bigness, CRNA     03/15/2021 [phone] chlorhexidine (PERIDEX) 0.12 % solution 15 mL 15 mL Mouth/Throat Given Azucena Kuba, RN     03/15/2021 0746 dexamethasone (DECADRON) injection 8 mg 8 mg Intravenous Given Garth Bigness, CRNA     03/15/2021 0737 esmolol (BREVIBLOC) injection 25 mg Intravenous Given Ellender, Catheryn Bacon, MD     03/15/2021 0734 esmolol (BREVIBLOC) injection 25 mg Intravenous Given Ellender, Catheryn Bacon, MD     03/15/2021 0929 fentaNYL (SUBLIMAZE) injection 25 mcg Intravenous Given Flynn-CookYoung Berry, CRNA     03/15/2021 [phone] fentaNYL (SUBLIMAZE) injection 25 mcg Intravenous Given  Flynn-CookYoung Berry, CRNA     03/15/2021 [phone] fentaNYL (SUBLIMAZE) injection 25 mcg Intravenous Given Flynn-Cook, Young Berry, CRNA     03/15/2021 0825 fentaNYL (SUBLIMAZE) injection 25 mcg Intravenous Given Flynn-Cook, Young Berry, CRNA     03/15/2021 0730 fentaNYL (SUBLIMAZE) injection 25 mcg Intravenous Given Flynn-Cook, Young Berry, CRNA     03/15/2021 0727 fentaNYL (SUBLIMAZE) injection 25 mcg Intravenous Given Flynn-Cook, Young Berry, CRNA     03/15/2021 [phone] fentaNYL (SUBLIMAZE) injection 50 mcg Intravenous Given Flynn-Cook, Young Berry, CRNA     03/15/2021 1023 fentaNYL (SUBLIMAZE) injection 25-50 mcg 50 mcg Intravenous Given Llana Aliment, RN     03/15/2021 0959 fentaNYL (SUBLIMAZE) injection 25-50 mcg 50 mcg Intravenous Given Llana Aliment, RN     03/15/2021 0759 ketamine (KETALAR) injection 20 mg Intravenous Given Flynn-CookYoung Berry, CRNA     03/15/2021 1007 lactated ringers bolus 500 mL 500 mL Intravenous New Bag/Given Llana Aliment, RN     03/15/2021 1601 lactated ringers infusion   Intravenous Anesthesia Volume Adjustment Garth Bigness, CRNA     03/15/2021 0825 lactated ringers infusion   Intravenous New Bag/Given Garth Bigness, CRNA     03/15/2021 0932 lactated ringers infusion   Intravenous Restarted Garth Bigness, CRNA     03/15/2021 0725 lactated ringers infusion   Intravenous Paused Flynn-Cook, Young Berry, CRNA Switch to gravity    03/15/2021 0725 lactated ringers infusion   Intravenous Continued from Pre-op Garth Bigness, CRNA     03/15/2021 [phone] lactated ringers infusion   Intravenous New Bag/Given Azucena Kuba, RN     03/15/2021 3220 lidocaine (cardiac) 100 mg/32mL (XYLOCAINE) injection 2% 40 mg Intravenous Given Flynn-CookYoung Berry, CRNA     03/15/2021 [phone] lidocaine (cardiac) 100 mg/62mL (XYLOCAINE) injection 2% 50 mg Intravenous Given Garth Bigness, CRNA     03/15/2021 [phone] MEDLINE mouth rinse   Mouth Rinse See  Alternative Azucena Kuba, RN     03/15/2021 3762 meloxicam (MOBIC) tablet 15 mg 15 mg Oral Given Azucena Kuba, RN     03/15/2021 8315 midazolam (VERSED) 5 MG/5ML injection 1 mg Intravenous Given Flynn-CookYoung Berry, CRNA     03/15/2021 [phone] midazolam (VERSED) 5 MG/5ML injection 1 mg Intravenous Given Flynn-CookYoung Berry, CRNA     03/15/2021 0747 ondansetron (ZOFRAN) injection 4 mg Intravenous Given Flynn-Cook, Young Berry, CRNA     03/15/2021 1021 oxyCODONE (Oxy IR/ROXICODONE) immediate release tablet 5 mg 5 mg Oral Given Llana Aliment, RN     03/15/2021 1021 oxyCODONE (ROXICODONE) 5 MG/5ML solution 5 mg   Oral See Alternative Llana Aliment, RN     03/15/2021 0912 phenylephrine (NEO-SYNEPHRINE) injection 80 mcg Intravenous Given Garth Bigness, CRNA     03/15/2021 [phone] phenylephrine (NEO-SYNEPHRINE) injection 40 mcg Intravenous Given Flynn-Cook, Claris Che  A, CRNA     03/15/2021 0845 phenylephrine (NEO-SYNEPHRINE) injection 80 mcg Intravenous Given Flynn-Cook, Young Berry, CRNA     03/15/2021 0840 phenylephrine (NEO-SYNEPHRINE) injection 80 mcg Intravenous Given Flynn-Cook, Young Berry, CRNA     03/15/2021 0801 phenylephrine (NEO-SYNEPHRINE) injection 60 mcg Intravenous Given Flynn-CookYoung Berry, CRNA     03/15/2021 [phone] phenylephrine (NEO-SYNEPHRINE) injection 80 mcg Intravenous Given Flynn-CookYoung Berry, CRNA     03/15/2021 0853 propofol (DIPRIVAN) 10 mg/mL bolus/IV push 50 mg Intravenous Given Flynn-Cook, Young Berry, CRNA     03/15/2021 0813 propofol (DIPRIVAN) 10 mg/mL bolus/IV push 50 mg Intravenous Given Ellender, Catheryn Bacon, MD     03/15/2021 0805 propofol (DIPRIVAN) 10 mg/mL bolus/IV push 100 mg Intravenous Given Flynn-Cook, Young Berry, CRNA     03/15/2021 0806 propofol (DIPRIVAN) 500 MG/50ML infusion 0 mcg/kg/min Intravenous Stopped Flynn-Cook, Young Berry, CRNA     03/15/2021 0750 propofol (DIPRIVAN) 500 MG/50ML infusion 50 mcg/kg/min Intravenous Rate/Dose Change  Flynn-Cook, Young Berry, CRNA     03/15/2021 0739 propofol (DIPRIVAN) 500 MG/50ML infusion 35 mcg/kg/min Intravenous Rate/Dose Change Flynn-Cook, Young Berry, CRNA     03/15/2021 0728 propofol (DIPRIVAN) 500 MG/50ML infusion 25 mcg/kg/min Intravenous New Bag/Given Flynn-Cook, Young Berry, CRNA     03/15/2021 0830 sodium chloride flush (NS) 0.9 % injection 50 mL  Given Margarita Rana D, MD mixed with exparel    03/15/2021 0812 sodium chloride irrigation 0.9 % 1,000 mL Irrigation Given Sheral Apley, MD via pulsavac    03/15/2021 0813 sterile water for irrigation for irrigation 2,000 mL Irrigation Given Sheral Apley, MD instrument rinse    03/15/2021 0753 tranexamic acid (CYKLOKAPRON) IVPB 1,000 mg 1,000 mg Intravenous Given Flynn-Cook, Young Berry, CRNA       Mobility {Mobility:20148}

## 2021-03-15 NOTE — Anesthesia Procedure Notes (Signed)
Procedure Name: LMA Insertion Date/Time: 03/15/2021 8:07 AM Performed by: Garth Bigness, CRNA Pre-anesthesia Checklist: Patient identified, Emergency Drugs available, Suction available, Patient being monitored and Timeout performed Patient Re-evaluated:Patient Re-evaluated prior to induction Oxygen Delivery Method: Circle system utilized Preoxygenation: Pre-oxygenation with 100% oxygen Induction Type: IV induction Ventilation: Mask ventilation without difficulty LMA: LMA inserted LMA Size: 4.0 Number of attempts: 1 Placement Confirmation: positive ETCO2 Tube secured with: Tape Dental Injury: Teeth and Oropharynx as per pre-operative assessment

## 2021-03-15 NOTE — Interval H&P Note (Signed)
History and Physical Interval Note:  03/15/2021 7:01 AM  [name]  has presented today for surgery, with the diagnosis of djd right knee.  The various methods of treatment have been discussed with the patient and family. After consideration of risks, benefits and other options for treatment, the patient has consented to  Procedure(s): TOTAL KNEE ARTHROPLASTY (Right) as a surgical intervention.  The patient's history has been reviewed, patient examined, no change in status, stable for surgery.  I have reviewed the patient's chart and labs.  Questions were answered to the patient's satisfaction.     Sheral Apley

## 2021-03-16 ENCOUNTER — Encounter (HOSPITAL_COMMUNITY): Payer: Self-pay | Admitting: Orthopedic Surgery

## 2021-03-16 DIAGNOSIS — M1711 Unilateral primary osteoarthritis, right knee: Secondary | ICD-10-CM | POA: Diagnosis not present

## 2021-03-16 MED ORDER — LACTATED RINGERS IV BOLUS
1000.0000 mL | Freq: Once | INTRAVENOUS | Status: AC
  Administered 2021-03-16: 1000 mL via INTRAVENOUS

## 2021-03-16 MED ORDER — LACTATED RINGERS IV BOLUS: 100.0000 mL | Freq: Once | INTRAVENOUS | Status: DC

## 2021-03-16 NOTE — Progress Notes (Signed)
    Subjective: Patient reports pain as mild. Slept well. Tolerating diet. Urinating. No CP, SOB. Has worked with PT/OT mobilizing OOB. Session went well.   Objective:   VITALS:   Vitals:   03/15/21 1726 03/15/21 2114 03/16/21 0111 03/16/21 0509  BP: (!) 96/52 116/66 (!) 95/45 137/64  Pulse: 68 85 79 68  Resp: 16 18 14 14   Temp:  98.9 F (37.2 C) 98.2 F (36.8 C) 98 F (36.7 C)  TempSrc:      SpO2: 100% 100% 100% 99%  Weight:      Height:       CBC Latest Ref Rng & Units 03/10/2021 10/08/2020 10/07/2020  WBC 4.0 - 10.5 K/uL 9.5 9.1 8.8  Hemoglobin 12.0 - 15.0 g/dL 11.3(L) 10.2(L) 9.4(L)  Hematocrit 36.0 - 46.0 % 36.8 33.0(L) 29.9(L)  Platelets 150 - 400 K/uL 313 270 260   BMP Latest Ref Rng & Units 03/10/2021 10/08/2020 10/07/2020  Glucose 70 - 99 mg/dL 10/09/2020) 80 836(O)  BUN 6 - 20 mg/dL 15 7 7   Creatinine 0.44 - 1.00 mg/dL 294(T 6.54  Sodium 135 - 145 mmol/L 137 139 138  Potassium 3.5 - 5.1 mmol/L 3.2(L) 3.7 3.5  Chloride 98 - 111 mmol/L 101 107 109  CO2 22 - 32 mmol/L 26 22 21(L)  Calcium 8.9 - 10.3 mg/dL 9.8 9.5 9.0   Intake/Output      03/29 0701 03/30 0700 03/30 0701 03/31 0700   I.V. (mL/kg) 1850 (22.4)    IV Piggyback 200    Total Intake(mL/kg) 2050 (24.8)    Urine (mL/kg/hr) 1600 (0.8)    Blood 50    Total Output 1650    Net +400            Physical Exam: General: NAD. Laying in bed. Smiling Resp: No increased wob Cardio: regular rate and rhythm ABD soft Neurologically intact MSK Neurovascularly intact Sensation intact distally Intact pulses distally Dorsiflexion/Plantar flexion intact Incision: dressing C/D/I Knee immobilizer in place  Assessment: 1 Day Post-Op  S/P Procedure(s) (LRB): TOTAL KNEE ARTHROPLASTY (Right) by Dr. 4/30. Murphy on 03/15/21  Active Problems:   Osteoarthritis of right knee   S/P total knee arthroplasty, right   Plan: Advance diet Up with therapy Incentive Spirometry Elevate and Apply  ice  Weightbearing: WBAT RLE Insicional and dressing care: Dressings left intact until follow-up and Reinforce dressings as needed Orthopedic device(s): Knee immobilizer. Can d/c within the next 48 hours Showering: Keep dressing dry VTE prophylaxis: Aspirin 81mg  BID x 30 days post-op, SCDs, ambulation Pain control: Continue current regimen Follow - up plan: 1 week Contact information:  Jewel Baize MD, 03/17/21 PA-C  Dispo: Home hopefully later today   Office Margarita Rana 03/16/2021, 8:03 AM

## 2021-03-16 NOTE — TOC Transition Note (Signed)
Transition of Care [facility]) - CM/SW Discharge Note   Patient Details  Name: [name] MRN: [id number] Date of Birth: 1961-03-21  [facility]) CM/SW Contact:  Lennart Pall, LCSW Phone Number: 03/16/2021, 1:22 PM   Clinical Narrative:    Met with pt and daughter and reviewed DME - rw and 3n1 via [place] delivered to room.  Plan for OPPT at Ortho MD office.  No further TOC needs.   Final next level of care: OP Rehab Barriers to Discharge: Barriers Resolved   Patient Goals and CMS Choice Patient states their goals for this hospitalization and ongoing recovery are:: return home      Discharge Placement                       Discharge Plan and Services                DME Arranged: 3-N-1,Walker rolling DME Agency: [place]                  Social Determinants of Health (SDOH) Interventions     Readmission Risk Interventions No flowsheet data found.

## 2021-03-16 NOTE — Progress Notes (Signed)
Physical Therapy Treatment Patient Details Name: [name] MRN: [id number] DOB: August 04, 1961 Today's Date: 03/16/2021    History of Present Illness 60 y.o. female admitted for R TKA on 03/15/21; PMH includes OSA uses CPAP, hypothyroid.    PT Comments    Pt ambulated 140' with RW, no loss of balance. Stair training completed. Pt demonstrates good understanding of HEP. She is ready to DC home from PT standpoint.   Follow Up Recommendations  Follow surgeon's recommendation for DC plan and follow-up therapies     Equipment Recommendations  Rolling walker with 5" wheels;3in1 (PT)    Recommendations for Other Services       Precautions / Restrictions Precautions Precautions: Fall;Knee Precaution Booklet Issued: Yes (comment) Precaution Comments: reviewed no pillow under knee Restrictions Other Position/Activity Restrictions: WBAT    Mobility  Bed Mobility Overal bed mobility: Modified Independent Bed Mobility: Supine to Sit     Supine to sit: HOB elevated;Modified independent (Device/Increase time)     General bed mobility comments: used rail, HOB up    Transfers Overall transfer level: Needs assistance Equipment used: Rolling walker (2 wheeled) Transfers: Sit to/from Stand Sit to Stand: Supervision         General transfer comment: VCs hand placement  Ambulation/Gait Ambulation/Gait assistance: Min guard;Supervision Gait Distance (Feet): 140 Feet Assistive device: Rolling walker (2 wheeled) Gait Pattern/deviations: Step-to pattern;Decreased step length - right;Decreased step length - left Gait velocity: decr   General Gait Details: VCs sequencing initially, no loss of balance   Stairs Stairs: Yes Stairs assistance: Min guard Stair Management: Two rails;Forwards;Step to pattern Number of Stairs: 3 General stair comments: pt stated she has 2 STE without rails or 4 STE with B rails at home; VCs for sequencing, daughter present and assisted with  managing RW   Wheelchair Mobility    Modified Rankin (Stroke Patients Only)       Balance Overall balance assessment: Needs assistance   Sitting balance-Leahy Scale: Good     Standing balance support: Bilateral upper extremity supported Standing balance-Leahy Scale: Poor                              Cognition Arousal/Alertness: Awake/alert Behavior During Therapy: WFL for tasks assessed/performed Overall Cognitive Status: Within Functional Limits for tasks assessed                                        Exercises Total Joint Exercises Ankle Circles/Pumps: AROM;Both;10 reps;Supine Quad Sets: AROM;Both;5 reps;Supine Short Arc Quad: AROM;Right;5 reps;Supine Heel Slides: AAROM;Right;Supine;10 reps Hip ABduction/ADduction: AROM;Right;10 reps;Supine Straight Leg Raises: AROM;Right;5 reps;Supine Long Arc Quad: AROM;AAROM;Right;5 reps;Seated Knee Flexion: AAROM;Right;10 reps;Seated Goniometric ROM: 5-60* AAROM R knee, 3/5 SLR    General Comments        Pertinent Vitals/Pain Pain Score: 7  Pain Location: R knee Pain Descriptors / Indicators: Sore Pain Intervention(s): Limited activity within patient's tolerance;Monitored during session;Premedicated before session;Ice applied    Home Living                      Prior Function            PT Goals (current goals can now be found in the care plan section) Acute Rehab PT Goals Patient Stated Goal: to be able to go to church PT Goal Formulation: With patient Time For Goal  Achievement: 03/22/21 Potential to Achieve Goals: Good Progress towards PT goals: Progressing toward goals    Frequency    7X/week      PT Plan Current plan remains appropriate    Co-evaluation              AM-PAC PT "6 Clicks" Mobility   Outcome Measure  Help needed turning from your back to your side while in a flat bed without using bedrails?: A Little Help needed moving from lying on your  back to sitting on the side of a flat bed without using bedrails?: A Little Help needed moving to and from a bed to a chair (including a wheelchair)?: A Little Help needed standing up from a chair using your arms (e.g., wheelchair or bedside chair)?: A Little Help needed to walk in hospital room?: A Little Help needed climbing 3-5 steps with a railing? : A Little 6 Click Score: 18    End of Session Equipment Utilized During Treatment: Gait belt Activity Tolerance: Patient tolerated treatment well Patient left: in chair;with call bell/phone within reach;with chair alarm set;with family/visitor present Nurse Communication: Mobility status PT Visit Diagnosis: Muscle weakness (generalized) (M62.81);Difficulty in walking, not elsewhere classified (R26.2);Pain Pain - Right/Left: Right Pain - part of body: Knee     Time: 7209-4709 PT Time Calculation (min) (ACUTE ONLY): 28 min  Charges:  $Gait Training: 8-22 mins $Therapeutic Exercise: 8-22 mins                     Ralene Bathe Kistler PT 03/16/2021  Acute Rehabilitation Services Pager [phone] Office [phone]

## 2021-03-16 NOTE — Anesthesia Postprocedure Evaluation (Signed)
Anesthesia Post Note  Patient: [name]  Procedure(s) Performed: TOTAL KNEE ARTHROPLASTY (Right Knee)     Patient location during evaluation: PACU Anesthesia Type: Regional and General Level of consciousness: awake Pain management: pain level controlled Vital Signs Assessment: post-procedure vital signs reviewed and stable Respiratory status: spontaneous breathing, nonlabored ventilation, respiratory function stable and patient connected to nasal cannula oxygen Cardiovascular status: blood pressure returned to baseline and stable Postop Assessment: no apparent nausea or vomiting Anesthetic complications: no   No complications documented.  Last Vitals:  Vitals:   03/16/21 0111 03/16/21 0509  BP: (!) 95/45 137/64  Pulse: 79 68  Resp: 14 14  Temp: 36.8 C 36.7 C  SpO2: 100% 99%    Last Pain:  Vitals:   03/16/21 0509  TempSrc:   PainSc: 7                  Doneta Bayman P Lillyann Ahart

## 2021-03-16 NOTE — Discharge Instructions (Signed)

## 2021-03-17 NOTE — Discharge Summary (Signed)
Physician Discharge Summary  Patient ID: [name] MRN: [id number] DOB/AGE: 1961/03/16 60 y.o.  Admit date: 03/15/2021 Discharge date: 03/17/2021  Admission Diagnoses:  Right knee osteoarthritis  Discharge Diagnoses:  Active Problems:   Osteoarthritis of right knee   S/P total knee arthroplasty, right   Past Medical History:  Diagnosis Date  . Arthritis   . Asthma   . GERD (gastroesophageal reflux disease)    pt denies   . Hyperlipemia 11/07/2011   . Hypothyroidism   . OSA on CPAP   . Thyroid disease     Surgeries: Procedure(s): RIGHT TOTAL KNEE ARTHROPLASTY on 03/15/2021   Consultants (if any): none  Discharged Condition: Improved  Hospital Course: [name] is an 60 y.o. female who was admitted 03/15/2021 with a diagnosis of right knee osteoarthritis and went to the operating room on 03/15/2021 and underwent the above named procedures.    She was given perioperative antibiotics:  Anti-infectives (From admission, onward)   Start     Dose/Rate Route Frequency Ordered Stop   03/15/21 1330  ceFAZolin (ANCEF) IVPB 2g/100 mL premix        2 g 200 mL/hr over 30 Minutes Intravenous Every 6 hours 03/15/21 1024 03/15/21 2048   03/15/21 0600  ceFAZolin (ANCEF) IVPB 2g/100 mL premix        2 g 200 mL/hr over 30 Minutes Intravenous On call to O.R. 03/15/21 4098 03/15/21 0753    .  She was given sequential compression devices, early ambulation, and ASA  bid for DVT prophylaxis.  She benefited maximally from the hospital stay and there were no complications.    Recent vital signs:  Vitals:   03/16/21 1025 03/16/21 1452  BP: 99/60 125/62  Pulse: 74 86  Resp: 14 15  Temp: 98.1 F (36.7 C)   SpO2: 97% 95%    Recent laboratory studies:  Lab Results  Component Value Date   HGB 11.3 (L) 03/10/2021   HGB 10.2 (L) 10/08/2020   HGB 9.4 (L) 10/07/2020   Lab Results  Component Value Date   WBC 9.5 03/10/2021   PLT 313 03/10/2021   Lab Results   Component Value Date   INR 1.0 10/04/2020   Lab Results  Component Value Date   NA 137 03/10/2021   K 3.2 (L) 03/10/2021   CL 101 03/10/2021   CO2 26 03/10/2021   BUN 15 03/10/2021   CREATININE 0.73 03/10/2021   GLUCOSE 103 (H) 03/10/2021    Discharge Medications:   Allergies as of 03/16/2021   No Known Allergies     Medication List    TAKE these medications   acetaminophen 500 MG tablet Commonly known as: TYLENOL Take 1 tablet (500 mg total) by mouth every 6 (six) hours as needed for mild pain or moderate pain.   aspirin EC 81 MG tablet Take 1 tablet (81 mg total) by mouth 2 (two) times daily. For DVT prophylaxis for 30 days after surgery.   atorvastatin 40 MG tablet Commonly known as: LIPITOR Take 40 mg by mouth at bedtime.   ferrous sulfate 325 (65 FE) MG tablet Take 1 tablet (325 mg total) by mouth daily.   fluticasone 50 MCG/ACT nasal spray Commonly known as: FLONASE Place 2 sprays into both nostrils daily as needed for allergies or rhinitis.   hydrochlorothiazide 25 MG tablet Commonly known as: HYDRODIURIL Take 25 mg by mouth daily.   levothyroxine 50 MCG tablet Commonly known as: SYNTHROID Take 1 tablet (50 mcg total) by  mouth daily before breakfast. What changed: how much to take   meloxicam 15 MG tablet Commonly known as: MOBIC Take 1 tablet (15 mg total) by mouth daily.   methocarbamol 500 MG tablet Commonly known as: Robaxin Take 1 tablet (500 mg total) by mouth every 8 (eight) hours as needed for muscle spasms.   omeprazole 20 MG capsule Commonly known as: PRILOSEC Take 1 capsule (20 mg total) by mouth daily.   ondansetron 4 MG tablet Commonly known as: Zofran Take 1 tablet (4 mg total) by mouth daily as needed for nausea or vomiting.   oxyCODONE 5 MG immediate release tablet Commonly known as: Roxicodone Take 1 tablet (5 mg total) by mouth every 8 (eight) hours as needed for severe pain.   PRESCRIPTION MEDICATION See admin  instructions. CPAP- At bedtime   ProAir HFA 108 (90 Base) MCG/ACT inhaler Generic drug: albuterol Inhale 2 puffs into the lungs in the morning, at noon, in the evening, and at bedtime.   sucralfate 1 GM/10ML suspension Commonly known as: CARAFATE Take 10 mLs (1 g total) by mouth 4 (four) times daily -  with meals and at bedtime.   Symbicort 80-4.5 MCG/ACT inhaler Generic drug: budesonide-formoterol Inhale 2 puffs into the lungs 2 (two) times daily.       Diagnostic Studies: DG Knee Right Port  Result Date: 03/15/2021 CLINICAL DATA:  Status post right knee replacement today. EXAM: PORTABLE RIGHT KNEE - 1-2 VIEW COMPARISON:  None. FINDINGS: Total knee arthroplasty is in place. The device is located. No fracture or other acute finding. Gas in the soft tissues from surgery noted. IMPRESSION: Status post right knee replacement.  No acute finding. Electronically Signed   By: Drusilla Kanner M.D.   On: 03/15/2021 11:01    Disposition: Discharge disposition: 01-Home or Self Care       Discharge Instructions    Call MD / Call 911   Complete by: As directed    If you experience chest pain or shortness of breath, CALL 911 and be transported to the hospital emergency room.  If you develope a fever above 101 F, pus (white drainage) or increased drainage or redness at the wound, or calf pain, call your surgeon's office.   Call MD / Call 911   Complete by: As directed    If you experience chest pain or shortness of breath, CALL 911 and be transported to the hospital emergency room.  If you develope a fever above 101 F, pus (white drainage) or increased drainage or redness at the wound, or calf pain, call your surgeon's office.   Diet - low sodium heart healthy   Complete by: As directed    Diet - low sodium heart healthy   Complete by: As directed    Discharge instructions   Complete by: As directed    You may bear weight as tolerated. Keep your dressing on and dry until follow up. Take  medicine to prevent blood clots as directed. Take pain medicine as needed with the goal of transitioning to over the counter medicines.    INSTRUCTIONS AFTER JOINT REPLACEMENT   Remove items at home which could result in a fall. This includes throw rugs or furniture in walking pathways ICE to the affected joint every three hours while awake for 30 minutes at a time, for at least the first 3-5 days, and then as needed for pain and swelling.  Continue to use ice for pain and swelling. You may notice swelling that will  progress down to the foot and ankle.  This is normal after surgery.  Elevate your leg when you are not up walking on it.   Continue to use the breathing machine you got in the hospital (incentive spirometer) which will help keep your temperature down.  It is common for your temperature to cycle up and down following surgery, especially at night when you are not up moving around and exerting yourself.  The breathing machine keeps your lungs expanded and your temperature down.   DIET:  As you were doing prior to hospitalization, we recommend a well-balanced diet.  DRESSING / WOUND CARE / SHOWERING  You may shower 3 days after surgery, but keep the bandage in place to keep wounds dry during showering.  You may use an occlusive plastic wrap (Press'n Seal for example) with blue painter's tape at edges to cover your bandage, NO SOAKING/SUBMERGING IN THE BATHTUB.  If the bandage gets wet, call the office.   ACTIVITY  Increase activity slowly as tolerated, but follow the weight bearing instructions below.   No driving for 6 weeks or until further direction given by your physician.  You cannot drive while taking narcotics.  No lifting or carrying greater than 10 lbs. until further directed by your surgeon. Avoid periods of inactivity such as sitting longer than an hour when not asleep. This helps prevent blood clots.  You may return to work once you are authorized by your doctor.     WEIGHT BEARING   Weight bearing as tolerated with assist device (walker, cane, etc) as directed, use it as long as suggested by your surgeon or therapist, typically at least 4-6 weeks.   EXERCISES  Results after joint replacement surgery are often greatly improved when you follow the exercise, range of motion and muscle strengthening exercises prescribed by your doctor. Safety measures are also important to protect the joint from further injury. Any time any of these exercises cause you to have increased pain or swelling, decrease what you are doing until you are comfortable again and then slowly increase them. If you have problems or questions, call your caregiver or physical therapist for advice.   Rehabilitation is important following a joint replacement. After just a few days of immobilization, the muscles of the leg can become weakened and shrink (atrophy).  These exercises are designed to build up the tone and strength of the thigh and leg muscles and to improve motion. Often times heat used for twenty to thirty minutes before working out will loosen up your tissues and help with improving the range of motion but do not use heat for the first two weeks following surgery (sometimes heat can increase post-operative swelling).   These exercises can be done on a training (exercise) mat, on the floor, on a table or on a bed. Use whatever works the best and is most comfortable for you.    Use music or television while you are exercising so that the exercises are a pleasant break in your day. This will make your life better with the exercises acting as a break in your routine that you can look forward to.   Perform all exercises about fifteen times, three times per day or as directed.  You should exercise both the operative leg and the other leg as well.  Exercises include:   Quad Sets - Tighten up the muscle on the front of the thigh (Quad) and hold for 5-10 seconds.   Straight Leg Raises -  With  your knee straight (if you were given a brace, keep it on), lift the leg to 60 degrees, hold for 3 seconds, and slowly lower the leg.  Perform this exercise against resistance later as your leg gets stronger.  Leg Slides: Lying on your back, slowly slide your foot toward your buttocks, bending your knee up off the floor (only go as far as is comfortable). Then slowly slide your foot back down until your leg is flat on the floor again.  Angel Wings: Lying on your back spread your legs to the side as far apart as you can without causing discomfort.  Hamstring Strength:  Lying on your back, push your heel against the floor with your leg straight by tightening up the muscles of your buttocks.  Repeat, but this time bend your knee to a comfortable angle, and push your heel against the floor.  You may put a pillow under the heel to make it more comfortable if necessary.   A rehabilitation program following joint replacement surgery can speed recovery and prevent re-injury in the future due to weakened muscles. Contact your doctor or a physical therapist for more information on knee rehabilitation.    CONSTIPATION  Constipation is defined medically as fewer than three stools per week and severe constipation as less than one stool per week.  Even if you have a regular bowel pattern at home, your normal regimen is likely to be disrupted due to multiple reasons following surgery.  Combination of anesthesia, postoperative narcotics, change in appetite and fluid intake all can affect your bowels.   YOU MUST use at least one of the following options; they are listed in order of increasing strength to get the job done.  They are all available over the counter, and you may need to use some, POSSIBLY even all of these options:    Drink plenty of fluids (prune juice may be helpful) and high fiber foods Colace 100 mg by mouth twice a day  Senokot for constipation as directed and as needed Dulcolax (bisacodyl),  take with full glass of water  Miralax (polyethylene glycol) once or twice a day as needed.  If you have tried all these things and are unable to have a bowel movement in the first 3-4 days after surgery call either your surgeon or your primary doctor.    If you experience loose stools or diarrhea, hold the medications until you stool forms back up.  If your symptoms do not get better within 1 week or if they get worse, check with your doctor.  If you experience "the worst abdominal pain ever" or develop nausea or vomiting, please contact the office immediately for further recommendations for treatment.   ITCHING:  If you experience itching with your medications, try taking only a single pain pill, or even half a pain pill at a time.  You can also use Benadryl over the counter for itching or also to help with sleep.   TED HOSE STOCKINGS:  Use stockings on both legs until for at least 2 weeks or as directed by physician office. They may be removed at night for sleeping.  MEDICATIONS:  See your medication summary on the "After Visit Summary" that nursing will review with you.  You may have some home medications which will be placed on hold until you complete the course of blood thinner medication.  It is important for you to complete the blood thinner medication as prescribed.  Take medicines as prescribed.   You have  several different medicines that work in different ways. Tylenol is for mild to moderate pain. Try to take this medicine before turning to your narcotic medicines.  Meloxicam is to reduce pain / inflammation Robaxin is for muscle spasms Oxycodone is a narcotic pain medicine.  Take this for severe pain. This medicine can be dehydrating / constipating. Zofran is for nausea and vomiting. Aspirin is to prevent blood clots after surgery.   PRECAUTIONS:  If you experience chest pain or shortness of breath - call 911 immediately for transfer to the hospital emergency department.   If you  develop a fever greater that 101 F, purulent drainage from wound, increased redness or drainage from wound, foul odor from the wound/dressing, or calf pain - CONTACT YOUR SURGEON.                                                   FOLLOW-UP APPOINTMENTS:  If you do not already have a post-op appointment, please call the office [phone] for an appointment to be seen by Dr. Eulah Pont in 2 weeks.   OTHER INSTRUCTIONS:   MAKE SURE YOU:  Understand these instructions.  Get help right away if you are not doing well or get worse.    Thank you for letting us be a part of your medical care team.  It is a privilege we respect greatly.  We hope these instructions will help you stay on track for a fast and full recovery!   Discharge instructions   Complete by: As directed    You may bear weight as tolerated. Keep your dressing on and dry until follow up. Take medicine to prevent blood clots as directed. Take pain medicine as needed with the goal of transitioning to over the counter medicines.    INSTRUCTIONS AFTER JOINT REPLACEMENT   Remove items at home which could result in a fall. This includes throw rugs or furniture in walking pathways ICE to the affected joint every three hours while awake for 30 minutes at a time, for at least the first 3-5 days, and then as needed for pain and swelling.  Continue to use ice for pain and swelling. You may notice swelling that will progress down to the foot and ankle.  This is normal after surgery.  Elevate your leg when you are not up walking on it.   Continue to use the breathing machine you got in the hospital (incentive spirometer) which will help keep your temperature down.  It is common for your temperature to cycle up and down following surgery, especially at night when you are not up moving around and exerting yourself.  The breathing machine keeps your lungs expanded and your temperature down.   DIET:  As you were doing prior to hospitalization, we  recommend a well-balanced diet.  DRESSING / WOUND CARE / SHOWERING  You may shower 3 days after surgery, but keep the wounds dry during showering.  You may use an occlusive plastic wrap (Press'n Seal for example) with blue painter's tape at edges, NO SOAKING/SUBMERGING IN THE BATHTUB.  If the bandage gets wet, change with a clean dry gauze.  If the incision gets wet, pat the wound dry with a clean towel.  ACTIVITY  Increase activity slowly as tolerated, but follow the weight bearing instructions below.   No driving for 6 weeks or until  further direction given by your physician.  You cannot drive while taking narcotics.  No lifting or carrying greater than 10 lbs. until further directed by your surgeon. Avoid periods of inactivity such as sitting longer than an hour when not asleep. This helps prevent blood clots.  You may return to work once you are authorized by your doctor.    WEIGHT BEARING   Weight bearing as tolerated with assist device (walker, cane, etc) as directed, use it as long as suggested by your surgeon or therapist, typically at least 4-6 weeks.   EXERCISES  Results after joint replacement surgery are often greatly improved when you follow the exercise, range of motion and muscle strengthening exercises prescribed by your doctor. Safety measures are also important to protect the joint from further injury. Any time any of these exercises cause you to have increased pain or swelling, decrease what you are doing until you are comfortable again and then slowly increase them. If you have problems or questions, call your caregiver or physical therapist for advice.   Rehabilitation is important following a joint replacement. After just a few days of immobilization, the muscles of the leg can become weakened and shrink (atrophy).  These exercises are designed to build up the tone and strength of the thigh and leg muscles and to improve motion. Often times heat used for twenty to thirty  minutes before working out will loosen up your tissues and help with improving the range of motion but do not use heat for the first two weeks following surgery (sometimes heat can increase post-operative swelling).   These exercises can be done on a training (exercise) mat, on the floor, on a table or on a bed. Use whatever works the best and is most comfortable for you.    Use music or television while you are exercising so that the exercises are a pleasant break in your day. This will make your life better with the exercises acting as a break in your routine that you can look forward to.   Perform all exercises about fifteen times, three times per day or as directed.  You should exercise both the operative leg and the other leg as well.  Exercises include:   Quad Sets - Tighten up the muscle on the front of the thigh (Quad) and hold for 5-10 seconds.   Straight Leg Raises - With your knee straight (if you were given a brace, keep it on), lift the leg to 60 degrees, hold for 3 seconds, and slowly lower the leg.  Perform this exercise against resistance later as your leg gets stronger.  Leg Slides: Lying on your back, slowly slide your foot toward your buttocks, bending your knee up off the floor (only go as far as is comfortable). Then slowly slide your foot back down until your leg is flat on the floor again.  Angel Wings: Lying on your back spread your legs to the side as far apart as you can without causing discomfort.  Hamstring Strength:  Lying on your back, push your heel against the floor with your leg straight by tightening up the muscles of your buttocks.  Repeat, but this time bend your knee to a comfortable angle, and push your heel against the floor.  You may put a pillow under the heel to make it more comfortable if necessary.   A rehabilitation program following joint replacement surgery can speed recovery and prevent re-injury in the future due to weakened muscles. Contact your doctor  or  a physical therapist for more information on knee rehabilitation.    CONSTIPATION  Constipation is defined medically as fewer than three stools per week and severe constipation as less than one stool per week.  Even if you have a regular bowel pattern at home, your normal regimen is likely to be disrupted due to multiple reasons following surgery.  Combination of anesthesia, postoperative narcotics, change in appetite and fluid intake all can affect your bowels.   YOU MUST use at least one of the following options; they are listed in order of increasing strength to get the job done.  They are all available over the counter, and you may need to use some, POSSIBLY even all of these options:    Drink plenty of fluids (prune juice may be helpful) and high fiber foods Colace 100 mg by mouth twice a day  Senokot for constipation as directed and as needed Dulcolax (bisacodyl), take with full glass of water  Miralax (polyethylene glycol) once or twice a day as needed.  If you have tried all these things and are unable to have a bowel movement in the first 3-4 days after surgery call either your surgeon or your primary doctor.    If you experience loose stools or diarrhea, hold the medications until you stool forms back up.  If your symptoms do not get better within 1 week or if they get worse, check with your doctor.  If you experience "the worst abdominal pain ever" or develop nausea or vomiting, please contact the office immediately for further recommendations for treatment.   ITCHING:  If you experience itching with your medications, try taking only a single pain pill, or even half a pain pill at a time.  You can also use Benadryl over the counter for itching or also to help with sleep.   TED HOSE STOCKINGS:  Use stockings on both legs until for at least 2 weeks or as directed by physician office. They may be removed at night for sleeping.  MEDICATIONS:  See your medication summary on the "After  Visit Summary" that nursing will review with you.  You may have some home medications which will be placed on hold until you complete the course of blood thinner medication.  It is important for you to complete the blood thinner medication as prescribed.  Take medicines as prescribed.   You have several different medicines that work in different ways. Tylenol is for mild to moderate pain. Try to take this medicine before turning to your narcotic medicines.  Meloxicam is to reduce pain / inflammation Robaxin is for muscle spasms Oxycodone is a narcotic pain medicine.  Take this for severe pain. This medicine can be dehydrating / constipating. Zofran is for nausea and vomiting. Aspirin is to prevent blood clots after surgery.   PRECAUTIONS:  If you experience chest pain or shortness of breath - call 911 immediately for transfer to the hospital emergency department.   If you develop a fever greater that 101 F, purulent drainage from wound, increased redness or drainage from wound, foul odor from the wound/dressing, or calf pain - CONTACT YOUR SURGEON.                                                   FOLLOW-UP APPOINTMENTS:  If you do not already have a post-op  appointment, please call the office [phone] for an appointment to be seen by Dr. Eulah Pont in 2 weeks.   OTHER INSTRUCTIONS:   MAKE SURE YOU:  Understand these instructions.  Get help right away if you are not doing well or get worse.    Thank you for letting us be a part of your medical care team.  It is a privilege we respect greatly.  We hope these instructions will help you stay on track for a fast and full recovery!       Follow-up Information    Sheral Apley, MD. Schedule an appointment as soon as possible for a visit in 1 week.   Specialty: Orthopedic Surgery Contact information: [address] [place] [address] [phone]        [facility]. Schedule an appointment  as soon as possible for a visit.   Why: PLEASE CALL ASAP AND SET UP YOUR PHYSICAL THERAPY APPOINTMENTS Contact information: [phone]               Signed: Jenne Pane PA-C 03/17/2021, 4:09 PM

## 2021-03-30 ENCOUNTER — Encounter: Payer: Self-pay | Admitting: Physical Therapy

## 2021-03-30 ENCOUNTER — Ambulatory Visit: Payer: Medicaid Other | Attending: Orthopedic Surgery | Admitting: Physical Therapy

## 2021-03-30 ENCOUNTER — Other Ambulatory Visit: Payer: Self-pay

## 2021-03-30 DIAGNOSIS — R600 Localized edema: Secondary | ICD-10-CM | POA: Diagnosis present

## 2021-03-30 DIAGNOSIS — M25661 Stiffness of right knee, not elsewhere classified: Secondary | ICD-10-CM

## 2021-03-30 DIAGNOSIS — M25561 Pain in right knee: Secondary | ICD-10-CM | POA: Diagnosis not present

## 2021-03-30 DIAGNOSIS — R262 Difficulty in walking, not elsewhere classified: Secondary | ICD-10-CM

## 2021-03-30 NOTE — Patient Instructions (Signed)
Access Code: [email or web address] URL: [email or web address] Date: 03/30/2021 Prepared by: Lysle Rubens  Exercises Seated Knee Flexion Stretch - 1 x daily - 7 x weekly - 3 sets - 5 reps - 15 sec hold Supine Knee Extension Strengthening - 1 x daily - 7 x weekly - 3 sets - 10 reps Supine Quadricep Sets - 1 x daily - 7 x weekly - 3 sets - 10 reps - 3 sec hold Small Range Straight Leg Raise - 1 x daily - 7 x weekly - 3 sets - 10 reps Seated Long Arc Quad - 1 x daily - 7 x weekly - 3 sets - 10 reps

## 2021-03-30 NOTE — Therapy (Signed)
[facility] [address] [place], [place], [address] Phone: [phone]   Fax:  [phone]  Physical Therapy Evaluation  Patient Details  Name: [name] MRN: [id number] Date of Birth: 03/05/1961 Referring Provider (PT): [name]   Encounter Date: 03/30/2021   PT End of Session - 03/30/21 1503    Visit Number 1    Date for PT Re-Evaluation 06/22/21    PT Start Time 1319    PT Stop Time 1400    PT Time Calculation (min) 41 min    Activity Tolerance Patient tolerated treatment well    Behavior During Therapy [facility] for tasks assessed/performed           Past Medical History:  Diagnosis Date  . Arthritis   . Asthma   . GERD (gastroesophageal reflux disease)    pt denies   . Hyperlipemia 11/07/2011   . Hypothyroidism   . OSA on CPAP   . Thyroid disease     Past Surgical History:  Procedure Laterality Date  . ABDOMINAL HYSTERECTOMY    . CHOLECYSTECTOMY    . TOTAL KNEE ARTHROPLASTY Right 03/15/2021   Procedure: TOTAL KNEE ARTHROPLASTY;  Surgeon: [name];  Location: [facility];  Service: Orthopedics;  Laterality: Right;    There were no vitals filed for this visit.    Subjective Assessment - 03/30/21 1323    Subjective Pt reports to clinic after R TKA 03/15/21 and discharged home 03/16/21. Pt reports she has not received HH and states she misplaced ex's. Reports she has been doing knee flexion stretch. Pt and daughter report she is able to get to/from bathroom and in/out of bed independently. States that stairs to enter house are still very difficult. She has not been icing Jimenez/t reports of discomfort with ice. Prior to surgery, pt was walking without AD and able to walk around house and around the grocery store; would like to return to this level of function.    Pertinent History asthma, OSA with cpap    Limitations Standing;Walking    How long can you walk comfortably? prior to surgery states could walk  around house and grocery store    Patient Stated Goals get rid of knee pain, walk without AD, be able to walk short community distances    Currently in Pain? Yes    Pain Score 7     Pain Location Knee    Pain Orientation Right    Pain Descriptors / Indicators Constant;Aching    Pain Type Surgical pain    Pain Radiating Towards some shooting pain down leg    Pain Onset 1 to 4 weeks ago    Pain Frequency Constant    Aggravating Factors  standing, bending knee, prolonged walking    Pain Relieving Factors rest, medication              [facility] PT Assessment - 03/30/21 0001      Assessment   Medical Diagnosis R TKA    Referring Provider (PT) [name]    Onset Date/Surgical Date 03/15/21    Next Jimenez Visit 04/20/21    Prior Therapy none      Precautions   Precautions None      Restrictions   Weight Bearing Restrictions No      Balance Screen   Has the patient fallen in the past 6 months No    Has the patient had a decrease in activity level because of a fear of falling?  No    Is the patient reluctant to leave their home because of a fear of falling?  No      Home Environment   Additional Comments 3-4 stairs to enter; no HR in back, HR in front; states needs help with stairs      Prior Function   Level of Independence Independent    Vocation On disability    Leisure sedentary      Observation/Other Assessments-Edema    Edema Circumferential      Circumferential Edema   Circumferential - Right 53 cm mid patella    Circumferential - Left  44 cm mid patella      Sensation   Light Touch Appears Intact      Functional Tests   Functional tests Sit to Stand      Sit to Stand   Comments leaning to LLE, needs UEs to push up      ROM / Strength   AROM / PROM / Strength AROM;PROM      AROM   AROM Assessment Site Knee    Right/Left Knee Right    Right Knee Extension 4    Right Knee Flexion 70      PROM   PROM Assessment Site Knee    Right/Left Knee Right    Right Knee  Extension 2    Right Knee Flexion 73      Ambulation/Gait   Gait Comments antalgic gait RLE with RW, trunk in forward flexion, and knee flexed throughout gait      Standardized Balance Assessment   Standardized Balance Assessment Timed Up and Go Test      Timed Up and Go Test   Normal TUG (seconds) 18.19    TUG Comments with RW                      Objective measurements completed on examination: See above findings.       [facility] Adult PT Treatment/Exercise - 03/30/21 0001      Exercises   Exercises Knee/Hip      Knee/Hip Exercises: Stretches   Knee: Self-Stretch to increase Flexion 5 reps;10 seconds      Knee/Hip Exercises: Seated   Long Arc Quad Right;1 set;10 reps      Knee/Hip Exercises: Supine   Quad Sets Right;1 set;10 reps    Short Arc Quad Sets Right;1 set;10 reps    Straight Leg Raises Right;1 set;5 reps    Straight Leg Raises Limitations AAROM with cues for QS                  PT Education - 03/30/21 1503    Education Details Pt educated on POC and HEP    Person(s) Educated Patient;Child(ren)    Methods Explanation;Demonstration;Handout    Comprehension Verbalized understanding;Returned demonstration            PT Short Term Goals - 03/30/21 1508      PT SHORT TERM GOAL #1   Title Pt will be I with initial HEP    Time 2    Period Weeks    Status New    Target Date 04/13/21             PT Long Term Goals - 03/30/21 1508      PT LONG TERM GOAL #1   Title Pt will demo R knee flexion to 120    Baseline 70    Time 8    Period Weeks  Status New    Target Date 05/25/21      PT LONG TERM GOAL #2   Title Pt will demo TUG with no AD <15 sec and gait WFL    Time 8    Period Weeks    Status New    Target Date 05/25/21      PT LONG TERM GOAL #3   Title Pt will demo full extension of R knee    Baseline -3    Time 8    Period Weeks    Status New    Target Date 05/25/21      PT LONG TERM GOAL #4   Title Pt will  demo R knee circumferential edema within 2 cm of L knee    Time 8    Period Weeks    Status New    Target Date 05/25/21      PT LONG TERM GOAL #5   Title Pt will be I with advanced HEP    Time 8    Period Weeks    Status New    Target Date 05/25/21                  Plan - 03/30/21 1504    Clinical Impression Statement Pt presents to clinic s/p R TKA 03/15/21 and subsequent Jimenez/c home from hospital 03/16/21. Pt has not been receiving HH therapy and was unable to complete most of HEP Jimenez/t misplacing ex's. Demos knee flexion to 70 deg today and lacking 3 deg from TKE. Prescribed HEP and reinforced importance of completing with pt demo understanding. Educated on icing as pt does demo sig increased edema R knee compared to L knee. No tenderness to palpation post gastroc, neg Homan's, and pt verb taking aspirin as prescribed. Pt ambulates into clinic with RW; was ambulating with no AD as PLOF. Pt would benefit from skilled PT to address the above impairments.    Personal Factors and Comorbidities Comorbidity 2    Comorbidities asthma, OSA    Examination-Activity Limitations Locomotion Level;Transfers;Stairs;Stand    Examination-Participation Restrictions Community Activity;Interpersonal Relationship    Stability/Clinical Decision Making Evolving/Moderate complexity    Clinical Decision Making Low    Rehab Potential Good    PT Frequency 2x / week    PT Duration 8 weeks    PT Treatment/Interventions ADLs/Self Care Home Management;Electrical Stimulation;Cryotherapy;Neuromuscular re-education;Therapeutic exercise;Therapeutic activities;Functional mobility training;Stair training;Gait training;Patient/family education;Manual techniques;Passive range of motion;Vasopneumatic Device    PT Next Visit Plan knee flex/ext ROM, gait training, stair training, manual/modalities as indicated    PT Home Exercise Plan see pt instructions    Consulted and Agree with Plan of Care Patient;Family  member/caregiver    Family Member Consulted daughter Ashok Cordia)           Patient will benefit from skilled therapeutic intervention in order to improve the following deficits and impairments:  Abnormal gait,Difficulty walking,Decreased endurance,Increased muscle spasms,Decreased activity tolerance,Pain,Hypomobility,Decreased mobility,Decreased strength,Increased edema  Visit Diagnosis: Acute pain of right knee  Stiffness of right knee, not elsewhere classified  Localized edema  Difficulty in walking, not elsewhere classified     Problem List Patient Active Problem List   Diagnosis Date Noted  . S/P total knee arthroplasty, right 03/15/2021  . Osteoarthritis of right knee 02/28/2021  . GERD (gastroesophageal reflux disease) 10/06/2020  . Anemia 10/06/2020  . Severe sepsis with lactic acidosis (HCC) 10/04/2020  . Dyslipidemia 10/04/2020  . OSA on CPAP 10/04/2020  . Obesity, Class III, BMI 40-49.9 (morbid obesity) (HCC)  10/04/2020  . Hypothyroidism 02/16/2009  . FRACTURE, GREAT TOE, LEFT 02/16/2009   Lysle Rubens, PT, DPT Maryanna Shape Loyed Wilmes 03/30/2021, 3:10 PM  [facility] [address] [place], [place], [address] Phone: [phone]   Fax:  [phone]  Name: [name] MRN: [id number] Date of Birth: 20-Oct-1961

## 2021-04-04 ENCOUNTER — Ambulatory Visit: Payer: Medicaid Other | Admitting: Physical Therapy

## 2021-04-07 ENCOUNTER — Encounter: Payer: Self-pay | Admitting: Physical Therapy

## 2021-04-07 ENCOUNTER — Other Ambulatory Visit: Payer: Self-pay

## 2021-04-07 ENCOUNTER — Ambulatory Visit: Payer: Medicaid Other | Admitting: Physical Therapy

## 2021-04-07 DIAGNOSIS — M25561 Pain in right knee: Secondary | ICD-10-CM

## 2021-04-07 DIAGNOSIS — M25661 Stiffness of right knee, not elsewhere classified: Secondary | ICD-10-CM

## 2021-04-07 DIAGNOSIS — R262 Difficulty in walking, not elsewhere classified: Secondary | ICD-10-CM

## 2021-04-07 DIAGNOSIS — R600 Localized edema: Secondary | ICD-10-CM

## 2021-04-07 NOTE — Therapy (Signed)
[place]. [place], [place], [address] Phone: [phone]   Fax:  [phone]  Physical Therapy Treatment  Patient Details  Name: [name] MRN: [id number] Date of Birth: [birth year] Referring Provider (PT): Colin Broach Date: 04/07/2021   PT End of Session - 04/07/21 1444    Visit Number 2    Date for PT Re-Evaluation 06/22/21    PT Start Time 1400    PT Stop Time 1444    PT Time Calculation (min) 44 min    Activity Tolerance Patient tolerated treatment well    Behavior During Therapy [facility] for tasks assessed/performed           Past Medical History:  Diagnosis Date  . Arthritis   . Asthma   . GERD (gastroesophageal reflux disease)    pt denies   . Hyperlipemia 11/07/2011   . Hypothyroidism   . OSA on CPAP   . Thyroid disease     Past Surgical History:  Procedure Laterality Date  . ABDOMINAL HYSTERECTOMY    . CHOLECYSTECTOMY    . TOTAL KNEE ARTHROPLASTY Right 03/15/2021   Procedure: TOTAL KNEE ARTHROPLASTY;  Surgeon: Renette Butters, MD;  Location: [facility];  Service: Orthopedics;  Laterality: Right;    There were no vitals filed for this visit.   Subjective Assessment - 04/07/21 1355    Subjective Pt reports that she has been doing home ex's; rates pain at a 4/10.    Currently in Pain? Yes    Pain Score 4     Pain Location Knee    Pain Orientation Right              [facility] PT Assessment - 04/07/21 0001      AROM   Right Knee Extension 3   supine   Right Knee Flexion 80      PROM   Right Knee Extension 0    Right Knee Flexion 83                         [facility] Adult PT Treatment/Exercise - 04/07/21 0001      Ambulation/Gait   Gait Comments gait training with SPC; education on AD/LE sequencing and cues for step length + heel strike      Knee/Hip Exercises: Aerobic   Recumbent Bike L1 x 4 min partial revolutions    Nustep L5 x 6 min      Knee/Hip  Exercises: Seated   Long Arc Quad Right;2 sets;10 reps    Long Arc Quad Limitations cue for [employer]    Hamstring Curl 2 sets;Right;10 reps    Hamstring Limitations red TB    Sit to Sand 1 set;10 reps;without UE support      Knee/Hip Exercises: Supine   Quad Sets Right;1 set;10 reps    Quad Sets Limitations 3 sec hold, tactile and VC for quad contraction    Short Arc [employer] Right;2 sets;10 reps    Short Arc Quad Sets Limitations 1 sec hold, cue for TKE    Heel Slides Right;2 sets;5 reps    Heel Slides Limitations with end range knee flexion stretch x10 sec each    Straight Leg Raises Right;1 set;10 reps    Straight Leg Raises Limitations cue for QS      Manual Therapy   Manual Therapy Joint mobilization;Passive ROM    Joint Mobilization gentle distraction    Passive ROM knee  flexion/extension                    PT Short Term Goals - 04/07/21 1530      PT SHORT TERM GOAL #1   Title Pt will be I with initial HEP    Time 2    Period Weeks    Status Partially Met    Target Date 04/13/21             PT Long Term Goals - 03/30/21 1508      PT LONG TERM GOAL #1   Title Pt will demo R knee flexion to 120    Baseline 70    Time 8    Period Weeks    Status New    Target Date 05/25/21      PT LONG TERM GOAL #2   Title Pt will demo TUG with no AD <15 sec and gait WFL    Time 8    Period Weeks    Status New    Target Date 05/25/21      PT LONG TERM GOAL #3   Title Pt will demo full extension of R knee    Baseline -3    Time 8    Period Weeks    Status New    Target Date 05/25/21      PT LONG TERM GOAL #4   Title Pt will demo R knee circumferential edema within 2 cm of L knee    Time 8    Period Weeks    Status New    Target Date 05/25/21      PT LONG TERM GOAL #5   Title Pt will be I with advanced HEP    Time 8    Period Weeks    Status New    Target Date 05/25/21                 Plan - 04/07/21 1444    Clinical Impression Statement Pt  presents to clinic for first time follow up since eval. Demos knee flexion to 83 deg this rx; reinforced education on importance of knee flexion stretching with pt VU. Able to tolerate some stretching but difficulty pushing past end range d/t reports of pain. Began gait training with SPC; has never used cane before so needed a lot of cues for sequencing of AD/LE along with cues for heel strike. Does not want vaso d/t discomfort with ice. Continue to push knee ROM and functional strengthening to tolerance along with gait training.    PT Treatment/Interventions ADLs/Self Care Home Management;Electrical Stimulation;Cryotherapy;Neuromuscular re-education;Therapeutic exercise;Therapeutic activities;Functional mobility training;Stair training;Gait training;Patient/family education;Manual techniques;Passive range of motion;Vasopneumatic Device    PT Next Visit Plan knee flex/ext ROM, gait training, stair training, manual/modalities as indicated    Consulted and Agree with Plan of Care Patient           Patient will benefit from skilled therapeutic intervention in order to improve the following deficits and impairments:  Abnormal gait,Difficulty walking,Decreased endurance,Increased muscle spasms,Decreased activity tolerance,Pain,Hypomobility,Decreased mobility,Decreased strength,Increased edema  Visit Diagnosis: Acute pain of right knee  Stiffness of right knee, not elsewhere classified  Localized edema  Difficulty in walking, not elsewhere classified     Problem List Patient Active Problem List   Diagnosis Date Noted  . S/P total knee arthroplasty, right 03/15/2021  . Osteoarthritis of right knee 02/28/2021  . GERD (gastroesophageal reflux disease) 10/06/2020  . Anemia 10/06/2020  . Severe sepsis with lactic acidosis (HCC)  10/04/2020  . Dyslipidemia 10/04/2020  . OSA on CPAP 10/04/2020  . Obesity, Class III, BMI 40-49.9 (morbid obesity) ([place]) 10/04/2020  . Hypothyroidism 02/16/2009  .  FRACTURE, GREAT TOE, LEFT 02/16/2009   Amador Cunas, PT, DPT Donald Prose Delmos Velaquez 04/07/2021, 3:31 PM  [place]. [place], [place], [address] Phone: [phone]   Fax:  [phone]  Name: [name] MRN: [id number] Date of Birth: 12/10/1961

## 2021-04-11 ENCOUNTER — Ambulatory Visit: Payer: Medicaid Other

## 2021-04-11 ENCOUNTER — Other Ambulatory Visit: Payer: Self-pay

## 2021-04-11 DIAGNOSIS — M25561 Pain in right knee: Secondary | ICD-10-CM | POA: Diagnosis not present

## 2021-04-11 DIAGNOSIS — R600 Localized edema: Secondary | ICD-10-CM

## 2021-04-11 DIAGNOSIS — R262 Difficulty in walking, not elsewhere classified: Secondary | ICD-10-CM

## 2021-04-11 DIAGNOSIS — M25661 Stiffness of right knee, not elsewhere classified: Secondary | ICD-10-CM

## 2021-04-11 NOTE — Therapy (Signed)
[place]. [place], [place], [address] Phone: [phone]   Fax:  [phone]  Physical Therapy Treatment  Patient Details  Name: [name] MRN: [id number] Date of Birth: 1961/08/01 Referring Provider (PT): Colin Broach Date: 04/11/2021   PT End of Session - 04/11/21 1455    Visit Number 3    Date for PT Re-Evaluation 06/22/21    PT Start Time 1450    PT Stop Time 1530    PT Time Calculation (min) 40 min    Activity Tolerance Patient tolerated treatment well    Behavior During Therapy [facility] for tasks assessed/performed           Past Medical History:  Diagnosis Date  . Arthritis   . Asthma   . GERD (gastroesophageal reflux disease)    pt denies   . Hyperlipemia 11/07/2011   . Hypothyroidism   . OSA on CPAP   . Thyroid disease     Past Surgical History:  Procedure Laterality Date  . ABDOMINAL HYSTERECTOMY    . CHOLECYSTECTOMY    . TOTAL KNEE ARTHROPLASTY Right 03/15/2021   Procedure: TOTAL KNEE ARTHROPLASTY;  Surgeon: Renette Butters, MD;  Location: [facility];  Service: Orthopedics;  Laterality: Right;    There were no vitals filed for this visit.   Subjective Assessment - 04/11/21 1454    Subjective Doing exercises 1x/day - reports doing knee bend stretch 10 seconds x 1 "like it says on the paper    Pertinent History asthma, OSA with cpap    Limitations Standing;Walking    Currently in Pain? Yes    Pain Score 5     Pain Location Knee    Pain Orientation Right              [facility] PT Assessment - 04/11/21 0001      AROM   Right Knee Extension 3   supine   Right Knee Flexion 80      PROM   Right Knee Extension 0    Right Knee Flexion 85                [facility] Adult PT Treatment/Exercise - 04/11/21 0001      Ambulation/Gait   Gait Comments gait training with SPC; education on AD/LE sequencing and cues for step length + heel strike      Knee/Hip Exercises:  Aerobic   Recumbent Bike L1 x 5 min partial revolutions    Nustep L5 x 6 min      Knee/Hip Exercises: Seated   Long Arc Quad Right;2 sets;10 reps    Long Arc Quad Limitations cue for [employer]    Hamstring Curl 2 sets;Right;10 reps    Hamstring Limitations red TB    Sit to Sand 1 set;10 reps;without UE support      Knee/Hip Exercises: Supine   Quad Sets --    Short Arc [employer] Right;2 sets;10 reps    Heel Slides Right;2 sets;5 reps    Heel Slides Limitations with end range knee flexion stretch x10 sec each    Straight Leg Raises Right;5 reps    Straight Leg Raises Limitations cue for QS      Manual Therapy   Manual Therapy Joint mobilization;Passive ROM    Joint Mobilization gentle distraction    Passive ROM knee flexion/extension                    PT Short  Term Goals - 04/07/21 1530      PT SHORT TERM GOAL #1   Title Pt will be I with initial HEP    Time 2    Period Weeks    Status Partially Met    Target Date 04/13/21             PT Long Term Goals - 03/30/21 1508      PT LONG TERM GOAL #1   Title Pt will demo R knee flexion to 120    Baseline 70    Time 8    Period Weeks    Status New    Target Date 05/25/21      PT LONG TERM GOAL #2   Title Pt will demo TUG with no AD <15 sec and gait WFL    Time 8    Period Weeks    Status New    Target Date 05/25/21      PT LONG TERM GOAL #3   Title Pt will demo full extension of R knee    Baseline -3    Time 8    Period Weeks    Status New    Target Date 05/25/21      PT LONG TERM GOAL #4   Title Pt will demo R knee circumferential edema within 2 cm of L knee    Time 8    Period Weeks    Status New    Target Date 05/25/21      PT LONG TERM GOAL #5   Title Pt will be I with advanced HEP    Time 8    Period Weeks    Status New    Target Date 05/25/21                 Plan - 04/11/21 1456    Clinical Impression Statement Pt presents for 2nd treatment visit since initial eval. Continues  to demo knee flexion to 83 deg today; reinforced education on importance of knee flexion stretching and enccourage high frequency since she says she isnt able to tolerate it for a long time. Able to tolerate some stretching but difficulty pushing past end range d/t reports of pain. Continued gait training with SPC; fair carryover from previous but a lot of cues for sequencing of AD/LE along with cues for heel strike, able to do more of a step through pattern with cues.  Continue to push knee ROM and functional strengthening to tolerance along with gait training.    PT Treatment/Interventions ADLs/Self Care Home Management;Electrical Stimulation;Cryotherapy;Neuromuscular re-education;Therapeutic exercise;Therapeutic activities;Functional mobility training;Stair training;Gait training;Patient/family education;Manual techniques;Passive range of motion;Vasopneumatic Device    PT Next Visit Plan knee flex/ext ROM, gait training, stair training, manual/modalities as indicated    Consulted and Agree with Plan of Care Patient           Patient will benefit from skilled therapeutic intervention in order to improve the following deficits and impairments:  Abnormal gait,Difficulty walking,Decreased endurance,Increased muscle spasms,Decreased activity tolerance,Pain,Hypomobility,Decreased mobility,Decreased strength,Increased edema  Visit Diagnosis: Acute pain of right knee  Stiffness of right knee, not elsewhere classified  Localized edema  Difficulty in walking, not elsewhere classified     Problem List Patient Active Problem List   Diagnosis Date Noted  . S/P total knee arthroplasty, right 03/15/2021  . Osteoarthritis of right knee 02/28/2021  . GERD (gastroesophageal reflux disease) 10/06/2020  . Anemia 10/06/2020  . Severe sepsis with lactic acidosis ([place]) 10/04/2020  . Dyslipidemia 10/04/2020  .  OSA on CPAP 10/04/2020  . Obesity, Class III, BMI 40-49.9 (morbid obesity) ([place]) 10/04/2020  .  Hypothyroidism 02/16/2009  . FRACTURE, GREAT TOE, LEFT 02/16/2009    Hall Busing , PT, DPT 04/11/2021, 4:27 PM  [place]. [place], [place], [address] Phone: [phone]   Fax:  [phone]  Name: [name] MRN: [id number] Date of Birth: 01-19-1961

## 2021-04-13 ENCOUNTER — Ambulatory Visit: Payer: Medicaid Other | Admitting: Physical Therapy

## 2021-04-14 ENCOUNTER — Encounter: Payer: Self-pay | Admitting: Physical Therapy

## 2021-04-14 ENCOUNTER — Ambulatory Visit: Payer: Medicaid Other | Admitting: Physical Therapy

## 2021-04-14 ENCOUNTER — Other Ambulatory Visit: Payer: Self-pay

## 2021-04-14 DIAGNOSIS — R262 Difficulty in walking, not elsewhere classified: Secondary | ICD-10-CM

## 2021-04-14 DIAGNOSIS — M25561 Pain in right knee: Secondary | ICD-10-CM | POA: Diagnosis not present

## 2021-04-14 DIAGNOSIS — M25661 Stiffness of right knee, not elsewhere classified: Secondary | ICD-10-CM

## 2021-04-14 DIAGNOSIS — R600 Localized edema: Secondary | ICD-10-CM

## 2021-04-14 NOTE — Therapy (Signed)
[place], [place], [address] Phone: [phone]   Fax:  [phone]  Physical Therapy Treatment  Patient Details  Name: [name] MRN: [id number] Date of Birth: 05/08/1961 Referring Provider (PT): Colin Broach Date: 04/14/2021   PT End of Session - 04/14/21 1423    Visit Number 4    Date for PT Re-Evaluation 06/22/21    PT Start Time 1343    PT Stop Time 1425    PT Time Calculation (min) 42 min    Activity Tolerance Patient tolerated treatment well    Behavior During Therapy [facility] for tasks assessed/performed           Past Medical History:  Diagnosis Date  . Arthritis   . Asthma   . GERD (gastroesophageal reflux disease)    pt denies   . Hyperlipemia 11/07/2011   . Hypothyroidism   . OSA on CPAP   . Thyroid disease     Past Surgical History:  Procedure Laterality Date  . ABDOMINAL HYSTERECTOMY    . CHOLECYSTECTOMY    . TOTAL KNEE ARTHROPLASTY Right 03/15/2021   Procedure: TOTAL KNEE ARTHROPLASTY;  Surgeon: Renette Butters, MD;  Location: [facility];  Service: Orthopedics;  Laterality: Right;    There were no vitals filed for this visit.   Subjective Assessment - 04/14/21 1347    Subjective "Doing ok"    Currently in Pain? Yes    Pain Score 5     Pain Location Knee    Pain Orientation Right                             [facility] Adult PT Treatment/Exercise - 04/14/21 0001      Ambulation/Gait   Gait Comments Gait without AD some lateral weight shift to L, cues for heel strike      Knee/Hip Exercises: Aerobic   Recumbent Bike L1 x 3 min partial revolutions    Nustep L5 x 6 min      Knee/Hip Exercises: Machines for Strengthening   Total Gym Leg Press 20lb 2x10      Knee/Hip Exercises: Standing   Heel Raises Both;2 sets;2 seconds;10 reps    Forward Step Up Right;3 sets;10 reps;Hand Hold: 2;Step Height: 4";Step Height: 6"   some compensation   Other  Standing Knee Exercises Standing marches 2x10      Knee/Hip Exercises: Seated   Long Arc Quad Right;2 sets;10 reps    Long Arc Quad Weight 3 lbs.    Hamstring Curl 2 sets;Right;10 reps    Hamstring Limitations red TB    Sit to Sand 10 reps;without UE support;2 sets      Manual Therapy   Manual Therapy Joint mobilization;Passive ROM    Joint Mobilization gentle distraction    Passive ROM knee flexion/extension                    PT Short Term Goals - 04/14/21 1426      PT SHORT TERM GOAL #1   Title Pt will be I with initial HEP    Status Partially Met             PT Long Term Goals - 04/14/21 1426      PT LONG TERM GOAL #1   Title Pt will demo R knee flexion to 120    Status On-going  Plan - 04/14/21 1423    Clinical Impression Statement Pt tolerated a progressed treatment session well. Some pain reported with MT at end range of flexion. Added resistance to LAQ with good extension. Progressed to RLE step up with some compensation noted but with good RLE Ext. Some L lateral shift noted with ambulation.    Personal Factors and Comorbidities Comorbidity 2    Comorbidities asthma, OSA    Examination-Activity Limitations Locomotion Level;Transfers;Stairs;Stand    Examination-Participation Restrictions Community Activity;Interpersonal Relationship    Stability/Clinical Decision Making Evolving/Moderate complexity    Rehab Potential Good    PT Frequency 2x / week    PT Duration 8 weeks    PT Treatment/Interventions ADLs/Self Care Home Management;Electrical Stimulation;Cryotherapy;Neuromuscular re-education;Therapeutic exercise;Therapeutic activities;Functional mobility training;Stair training;Gait training;Patient/family education;Manual techniques;Passive range of motion;Vasopneumatic Device    PT Next Visit Plan knee flex/ext ROM, gait training, stair training, manual/modalities as indicated           Patient will benefit from skilled  therapeutic intervention in order to improve the following deficits and impairments:  Abnormal gait,Difficulty walking,Decreased endurance,Increased muscle spasms,Decreased activity tolerance,Pain,Hypomobility,Decreased mobility,Decreased strength,Increased edema  Visit Diagnosis: Acute pain of right knee  Stiffness of right knee, not elsewhere classified  Localized edema  Difficulty in walking, not elsewhere classified     Problem List Patient Active Problem List   Diagnosis Date Noted  . S/P total knee arthroplasty, right 03/15/2021  . Osteoarthritis of right knee 02/28/2021  . GERD (gastroesophageal reflux disease) 10/06/2020  . Anemia 10/06/2020  . Severe sepsis with lactic acidosis ([place]) 10/04/2020  . Dyslipidemia 10/04/2020  . OSA on CPAP 10/04/2020  . Obesity, Class III, BMI 40-49.9 (morbid obesity) ([place]) 10/04/2020  . Hypothyroidism 02/16/2009  . FRACTURE, GREAT TOE, LEFT 02/16/2009    Scot Jun, PTA 04/14/2021, 2:26 PM  [place], [place], [place], [address] Phone: [phone]   Fax:  [phone]  Name: [name] MRN: [id number] Date of Birth: [birth year]

## 2021-04-18 ENCOUNTER — Encounter: Payer: Medicaid Other | Admitting: Physical Therapy

## 2021-04-19 ENCOUNTER — Ambulatory Visit: Payer: Medicaid Other | Attending: Orthopedic Surgery | Admitting: Physical Therapy

## 2021-04-19 ENCOUNTER — Encounter: Payer: Self-pay | Admitting: Physical Therapy

## 2021-04-19 ENCOUNTER — Other Ambulatory Visit: Payer: Self-pay

## 2021-04-19 DIAGNOSIS — M25661 Stiffness of right knee, not elsewhere classified: Secondary | ICD-10-CM | POA: Insufficient documentation

## 2021-04-19 DIAGNOSIS — R262 Difficulty in walking, not elsewhere classified: Secondary | ICD-10-CM | POA: Insufficient documentation

## 2021-04-19 DIAGNOSIS — R600 Localized edema: Secondary | ICD-10-CM | POA: Insufficient documentation

## 2021-04-19 DIAGNOSIS — M25561 Pain in right knee: Secondary | ICD-10-CM | POA: Insufficient documentation

## 2021-04-19 NOTE — Therapy (Signed)
[facility] [place] [address] [place], [place], [address] Phone: [phone]   Fax:  [phone]  Physical Therapy Treatment  Patient Details  Name: [name] MRN: [id number] Date of Birth: [birth year] Referring Provider (PT): Ilean China Date: 04/19/2021   PT End of Session - 04/19/21 1138    Visit Number 5    Date for PT Re-Evaluation 06/22/21    PT Start Time 1100    PT Stop Time 1140    PT Time Calculation (min) 40 min    Activity Tolerance Patient tolerated treatment well    Behavior During Therapy [facility] for tasks assessed/performed           Past Medical History:  Diagnosis Date  . Arthritis   . Asthma   . GERD (gastroesophageal reflux disease)    pt denies   . Hyperlipemia 11/07/2011   . Hypothyroidism   . OSA on CPAP   . Thyroid disease     Past Surgical History:  Procedure Laterality Date  . ABDOMINAL HYSTERECTOMY    . CHOLECYSTECTOMY    . TOTAL KNEE ARTHROPLASTY Right 03/15/2021   Procedure: TOTAL KNEE ARTHROPLASTY;  Surgeon: Sheral Apley, MD;  Location[facility];  Service: Orthopedics;  Laterality: Right;    There were no vitals filed for this visit.   Subjective Assessment - 04/19/21 1103    Subjective Feeling ok    Currently in Pain? Yes    Pain Score 7     Pain Location Leg    Pain Orientation Right;Distal;Lateral                             [facility] Adult PT Treatment/Exercise - 04/19/21 0001      Ambulation/Gait   Ambulation/Gait Yes    Ambulation Distance (Feet) 120 Feet    Assistive device None    Gait Pattern Decreased step length - right;Decreased hip/knee flexion - right;Lateral trunk lean to right    Ambulation Surface Unlevel;Outdoor;Paved    Gait Comments Gait without AD some lateral weight shift to R, cues for heel strike,      Knee/Hip Exercises: Aerobic   Recumbent Bike L1 x 3 min partial revolutions      Knee/Hip Exercises: Machines for  Strengthening   Cybex Knee Extension 5lb 2x10    Cybex Knee Flexion 25lb 2x10    Total Gym Leg Press 20lb 2x10; RLE TKE 20lb 2x10      Knee/Hip Exercises: Standing   Forward Step Up Right;2 sets;10 reps;Hand Hold: 0;Step Height: 6"      Knee/Hip Exercises: Seated   Long Arc Quad Right;2 sets;10 reps    Long Arc Quad Weight 3 lbs.    Hamstring Curl 2 sets;Right;10 reps    Hamstring Limitations green tband                    PT Short Term Goals - 04/19/21 1142      PT SHORT TERM GOAL #1   Title Pt will be I with initial HEP    Status Achieved             PT Long Term Goals - 04/19/21 1142      PT LONG TERM GOAL #1   Title Pt will demo R knee flexion to 120    Status On-going  Plan - 04/19/21 1139    Clinical Impression Statement Pt able to complete all interventions. She was able to progress to outdoor ambulation with some gait deviations. SBA was given for curb negotiation when outdoors. Added single leg TKE strengthening without issues. pain reported with end range passive flexion. Cues needed to prevent jumping off LLE with forward step ups. Some difficulty with machine level leg flexion and extension.    Personal Factors and Comorbidities Comorbidity 2    Comorbidities asthma, OSA    Examination-Activity Limitations Locomotion Level;Transfers;Stairs;Stand    Examination-Participation Restrictions Community Activity;Interpersonal Relationship    Stability/Clinical Decision Making Evolving/Moderate complexity    Rehab Potential Good    PT Frequency 2x / week    PT Duration 8 weeks    PT Treatment/Interventions ADLs/Self Care Home Management;Electrical Stimulation;Cryotherapy;Neuromuscular re-education;Therapeutic exercise;Therapeutic activities;Functional mobility training;Stair training;Gait training;Patient/family education;Manual techniques;Passive range of motion;Vasopneumatic Device    PT Next Visit Plan knee flex/ext ROM, gait  training, stair training, manual/modalities as indicated           Patient will benefit from skilled therapeutic intervention in order to improve the following deficits and impairments:  Abnormal gait,Difficulty walking,Decreased endurance,Increased muscle spasms,Decreased activity tolerance,Pain,Hypomobility,Decreased mobility,Decreased strength,Increased edema  Visit Diagnosis: Acute pain of right knee  Stiffness of right knee, not elsewhere classified  Localized edema  Difficulty in walking, not elsewhere classified     Problem List Patient Active Problem List   Diagnosis Date Noted  . S/P total knee arthroplasty, right 03/15/2021  . Osteoarthritis of right knee 02/28/2021  . GERD (gastroesophageal reflux disease) 10/06/2020  . Anemia 10/06/2020  . Severe sepsis with lactic acidosis ([facility]) 10/04/2020  . Dyslipidemia 10/04/2020  . OSA on CPAP 10/04/2020  . Obesity, Class III, BMI 40-49.9 (morbid obesity) ([facility]) 10/04/2020  . Hypothyroidism 02/16/2009  . FRACTURE, GREAT TOE, LEFT 02/16/2009    [name], PTA 04/19/2021, 11:43 AM  [facility] [place] [facility] [address] [place], [place], [address] Phone: [phone]   Fax:  [phone]  Name: [name] MRN: [id number] Date of Birth: May 11, 1961

## 2021-04-21 ENCOUNTER — Encounter: Payer: Self-pay | Admitting: Physical Therapy

## 2021-04-21 ENCOUNTER — Ambulatory Visit: Payer: Medicaid Other | Admitting: Physical Therapy

## 2021-04-21 ENCOUNTER — Other Ambulatory Visit: Payer: Self-pay

## 2021-04-21 DIAGNOSIS — R262 Difficulty in walking, not elsewhere classified: Secondary | ICD-10-CM

## 2021-04-21 DIAGNOSIS — M25561 Pain in right knee: Secondary | ICD-10-CM | POA: Diagnosis not present

## 2021-04-21 DIAGNOSIS — M25661 Stiffness of right knee, not elsewhere classified: Secondary | ICD-10-CM

## 2021-04-21 DIAGNOSIS — R600 Localized edema: Secondary | ICD-10-CM

## 2021-04-21 NOTE — Therapy (Signed)
[facility] [place] [facility] [address] [place], [place], [address] Phone: [phone]   Fax:  [phone]  Physical Therapy Treatment  Patient Details  Name: [name] MRN: [id number] Date of Birth: 1961-11-30 Referring Provider (PT): Ilean China Date: 04/21/2021   PT End of Session - 04/21/21 1425    Visit Number 6    Date for PT Re-Evaluation 06/22/21    PT Start Time 1352    PT Stop Time 1432    PT Time Calculation (min) 40 min    Activity Tolerance Patient tolerated treatment well    Behavior During Therapy [facility] for tasks assessed/performed           Past Medical History:  Diagnosis Date  . Arthritis   . Asthma   . GERD (gastroesophageal reflux disease)    pt denies   . Hyperlipemia 11/07/2011   . Hypothyroidism   . OSA on CPAP   . Thyroid disease     Past Surgical History:  Procedure Laterality Date  . ABDOMINAL HYSTERECTOMY    . CHOLECYSTECTOMY    . TOTAL KNEE ARTHROPLASTY Right 03/15/2021   Procedure: TOTAL KNEE ARTHROPLASTY;  Surgeon: Sheral Apley, MD;  Location: [facility];  Service: Orthopedics;  Laterality: Right;    There were no vitals filed for this visit.   Subjective Assessment - 04/21/21 1354    Subjective "Ok"    Currently in Pain? No/denies                             [facility] Adult PT Treatment/Exercise - 04/21/21 0001      Knee/Hip Exercises: Aerobic   Nustep L3 x 6 min LE only      Knee/Hip Exercises: Standing   Heel Raises Both;2 sets;2 seconds;10 reps    Lateral Step Up Right;2 sets;10 reps;Hand Hold: 0;Step Height: 4";Step Height: 6"    Forward Step Up Right;2 sets;10 reps;Hand Hold: 0;Step Height: 6"    Walking with Sports Cord 30lb 4 way x 3 each    Other Standing Knee Exercises RLE 8'' box taps with stretch      Knee/Hip Exercises: Seated   Long Arc Quad Right;2 sets;15 reps    Long Arc Quad Weight 3 lbs.    Hamstring Curl 2 sets;Right;10 reps     Hamstring Limitations green tband    Sit to Sand 2 sets;10 reps;without UE support   holding yellow ball                   PT Short Term Goals - 04/21/21 1429      PT SHORT TERM GOAL #1   Title Pt will be I with initial HEP             PT Long Term Goals - 04/19/21 1142      PT LONG TERM GOAL #1   Title Pt will demo R knee flexion to 120    Status On-going                 Plan - 04/21/21 1430    Clinical Impression Statement Pt did well with a progressed treatment session. Some hip hike compensation noted with 8'' box taps. Cues needed to keep hips square with resisted side steps. Some compensation also noted with lateral step ups.    Personal Factors and Comorbidities Comorbidity 2    Comorbidities asthma, OSA    Examination-Activity Limitations  Locomotion Level;Transfers;Stairs;Stand    Examination-Participation Restrictions Community Activity;Interpersonal Relationship    Stability/Clinical Decision Making Evolving/Moderate complexity    Rehab Potential Good    PT Frequency 2x / week    PT Duration 8 weeks    PT Treatment/Interventions ADLs/Self Care Home Management;Electrical Stimulation;Cryotherapy;Neuromuscular re-education;Therapeutic exercise;Therapeutic activities;Functional mobility training;Stair training;Gait training;Patient/family education;Manual techniques;Passive range of motion;Vasopneumatic Device    PT Next Visit Plan knee flex/ext ROM, gait training, stair training, manual/modalities as indicated           Patient will benefit from skilled therapeutic intervention in order to improve the following deficits and impairments:  Abnormal gait,Difficulty walking,Decreased endurance,Increased muscle spasms,Decreased activity tolerance,Pain,Hypomobility,Decreased mobility,Decreased strength,Increased edema  Visit Diagnosis: Localized edema  Difficulty in walking, not elsewhere classified  Stiffness of right knee, not elsewhere  classified  Acute pain of right knee     Problem List Patient Active Problem List   Diagnosis Date Noted  . S/P total knee arthroplasty, right 03/15/2021  . Osteoarthritis of right knee 02/28/2021  . GERD (gastroesophageal reflux disease) 10/06/2020  . Anemia 10/06/2020  . Severe sepsis with lactic acidosis ([facility]) 10/04/2020  . Dyslipidemia 10/04/2020  . OSA on CPAP 10/04/2020  . Obesity, Class III, BMI 40-49.9 (morbid obesity) ([facility]) 10/04/2020  . Hypothyroidism 02/16/2009  . FRACTURE, GREAT TOE, LEFT 02/16/2009    Grayce Sessions, PTA 04/21/2021, 2:36 PM  [facility] [place] [facility] [address] [place], [place], [address] Phone: [phone]   Fax:  [phone]  Name: [name] MRN: [id number] Date of Birth: 09-Jan-1961

## 2021-04-25 ENCOUNTER — Ambulatory Visit: Payer: Medicaid Other | Admitting: Physical Therapy

## 2021-04-27 ENCOUNTER — Other Ambulatory Visit: Payer: Self-pay

## 2021-04-27 ENCOUNTER — Encounter: Payer: Self-pay | Admitting: Physical Therapy

## 2021-04-27 ENCOUNTER — Ambulatory Visit: Payer: Medicaid Other | Admitting: Physical Therapy

## 2021-04-27 DIAGNOSIS — R600 Localized edema: Secondary | ICD-10-CM

## 2021-04-27 DIAGNOSIS — M25661 Stiffness of right knee, not elsewhere classified: Secondary | ICD-10-CM

## 2021-04-27 DIAGNOSIS — R262 Difficulty in walking, not elsewhere classified: Secondary | ICD-10-CM

## 2021-04-27 DIAGNOSIS — M25561 Pain in right knee: Secondary | ICD-10-CM

## 2021-04-27 NOTE — Therapy (Signed)
[facility] [place] [facility] [address] [place], [place], [address] Phone: [phone]   Fax:  [phone]  Physical Therapy Treatment  Patient Details  Name: [name] MRN: [id number] Date of Birth: [birth year] Referring Provider (PT): Ilean China Date: 04/27/2021   PT End of Session - 04/27/21 1438    Visit Number 7    Date for PT Re-Evaluation 06/22/21    PT Start Time 1357    PT Stop Time 1438    PT Time Calculation (min) 41 min    Activity Tolerance Patient tolerated treatment well    Behavior During Therapy [facility] for tasks assessed/performed           Past Medical History:  Diagnosis Date  . Arthritis   . Asthma   . GERD (gastroesophageal reflux disease)    pt denies   . Hyperlipemia 11/07/2011   . Hypothyroidism   . OSA on CPAP   . Thyroid disease     Past Surgical History:  Procedure Laterality Date  . ABDOMINAL HYSTERECTOMY    . CHOLECYSTECTOMY    . TOTAL KNEE ARTHROPLASTY Right 03/15/2021   Procedure: TOTAL KNEE ARTHROPLASTY;  Surgeon: Sheral Apley, MD;  Location: [facility];  Service: Orthopedics;  Laterality: Right;    There were no vitals filed for this visit.   Subjective Assessment - 04/27/21 1404    Subjective P reports that she fell this past Sunday. Tripped on a pair of shoes and fell forward hitting her head. Went to the doctor to get checked out this morning; states cleared. Denies new HA or changes in vision. Reports that knee is still a little sore from the fall.    Currently in Pain? Yes    Pain Score 7     Pain Location Knee    Pain Orientation Right              [facility] PT Assessment - 04/27/21 0001      AROM   Right Knee Extension 2    Right Knee Flexion 85      PROM   Right Knee Flexion 93                         [facility] Adult PT Treatment/Exercise - 04/27/21 0001      Knee/Hip Exercises: Aerobic   Recumbent Bike L1 x 4 min partial revolutions    Nustep  L3 x 6 min LE/UE      Knee/Hip Exercises: Machines for Strengthening   Cybex Knee Extension 5lb 2x10   unable to do single leg on machine so completed this with ankle weight   Cybex Knee Flexion 25lb 2x10   cues for full range     Knee/Hip Exercises: Standing   Lateral Step Up Right;2 sets;10 reps;Hand Hold: 0;Step Height: 4";Step Height: 6"    Forward Step Up Right;2 sets;10 reps;Hand Hold: 0;Step Height: 6"    Other Standing Knee Exercises alt step taps 6" stair x10 B no HR      Knee/Hip Exercises: Seated   Long Arc Quad Right;2 sets;15 reps    Long Arc Quad Weight 5 lbs.    Sit to Sand 2 sets;10 reps;without UE support      Manual Therapy   Manual Therapy Joint mobilization;Passive ROM    Joint Mobilization gentle distraction    Passive ROM knee flexion/extension  PT Short Term Goals - 04/21/21 1429      PT SHORT TERM GOAL #1   Title Pt will be I with initial HEP             PT Long Term Goals - 04/19/21 1142      PT LONG TERM GOAL #1   Title Pt will demo R knee flexion to 120    Status On-going                 Plan - 04/27/21 1438    Clinical Impression Statement Pt presents to clinic reporting fall Sunday, 04/24/21. Saw MD earlier today, incision looks good and everything in place on xray. No new HA or changes in vision. Does demo increased soreness this rx and limited tolerance to PROM. Knee ROM from 2-93 deg this rx. Cues to reduce compensations with step ups and for increased ROM on knee flexion/extension machine. Continue to progress to tolerance.    PT Treatment/Interventions ADLs/Self Care Home Management;Electrical Stimulation;Cryotherapy;Neuromuscular re-education;Therapeutic exercise;Therapeutic activities;Functional mobility training;Stair training;Gait training;Patient/family education;Manual techniques;Passive range of motion;Vasopneumatic Device    PT Next Visit Plan knee flex/ext ROM, gait training, stair training,  manual/modalities as indicated    Consulted and Agree with Plan of Care Patient           Patient will benefit from skilled therapeutic intervention in order to improve the following deficits and impairments:  Abnormal gait,Difficulty walking,Decreased endurance,Increased muscle spasms,Decreased activity tolerance,Pain,Hypomobility,Decreased mobility,Decreased strength,Increased edema  Visit Diagnosis: Localized edema  Difficulty in walking, not elsewhere classified  Stiffness of right knee, not elsewhere classified  Acute pain of right knee     Problem List Patient Active Problem List   Diagnosis Date Noted  . S/P total knee arthroplasty, right 03/15/2021  . Osteoarthritis of right knee 02/28/2021  . GERD (gastroesophageal reflux disease) 10/06/2020  . Anemia 10/06/2020  . Severe sepsis with lactic acidosis ([facility]) 10/04/2020  . Dyslipidemia 10/04/2020  . OSA on CPAP 10/04/2020  . Obesity, Class III, BMI 40-49.9 (morbid obesity) ([facility]) 10/04/2020  . Hypothyroidism 02/16/2009  . FRACTURE, GREAT TOE, LEFT 02/16/2009   Lysle Rubens, PT, DPT Maryanna Shape Jennfier Abdulla 04/27/2021, 2:41 PM  [facility] [place] [address] [place], [place], [address] Phone: [phone]   Fax:  [phone]  Name: [name] MRN: [id number] Date of Birth: 06/23/1961

## 2021-05-02 ENCOUNTER — Ambulatory Visit: Payer: Medicaid Other | Admitting: Physical Therapy

## 2021-05-02 ENCOUNTER — Other Ambulatory Visit: Payer: Self-pay

## 2021-05-02 ENCOUNTER — Encounter: Payer: Self-pay | Admitting: Physical Therapy

## 2021-05-02 DIAGNOSIS — R600 Localized edema: Secondary | ICD-10-CM

## 2021-05-02 DIAGNOSIS — M25561 Pain in right knee: Secondary | ICD-10-CM | POA: Diagnosis not present

## 2021-05-02 DIAGNOSIS — R262 Difficulty in walking, not elsewhere classified: Secondary | ICD-10-CM

## 2021-05-02 DIAGNOSIS — M25661 Stiffness of right knee, not elsewhere classified: Secondary | ICD-10-CM

## 2021-05-02 NOTE — Therapy (Signed)
[facility] [place] [facility] [address]. [place], [place], [address] Phone: [phone]   Fax:  [phone]  Physical Therapy Treatment  Patient Details  Name: [name] MRN: [id number] Date of Birth: 1961-05-04 Referring Provider (PT): Ilean China Date: 05/02/2021   PT End of Session - 05/02/21 1509    Visit Number 8    Date for PT Re-Evaluation 06/22/21    PT Start Time 1430    PT Stop Time 1511    PT Time Calculation (min) 41 min    Activity Tolerance Patient tolerated treatment well    Behavior During Therapy [facility] for tasks assessed/performed           Past Medical History:  Diagnosis Date  . Arthritis   . Asthma   . GERD (gastroesophageal reflux disease)    pt denies   . Hyperlipemia 11/07/2011   . Hypothyroidism   . OSA on CPAP   . Thyroid disease     Past Surgical History:  Procedure Laterality Date  . ABDOMINAL HYSTERECTOMY    . CHOLECYSTECTOMY    . TOTAL KNEE ARTHROPLASTY Right 03/15/2021   Procedure: TOTAL KNEE ARTHROPLASTY;  Surgeon: Sheral Apley, MD;  Location: [facility];  Service: Orthopedics;  Laterality: Right;    There were no vitals filed for this visit.   Subjective Assessment - 05/02/21 1430    Subjective doing ok today.    Pertinent History asthma, OSA with cpap    Currently in Pain? Yes    Pain Score 5     Pain Location Knee    Pain Orientation Right                             [facility] Adult PT Treatment/Exercise - 05/02/21 0001      Knee/Hip Exercises: Aerobic   Recumbent Bike L1 x 4 min partial revolutions    Nustep L4 x 6 min LE only      Knee/Hip Exercises: Machines for Strengthening   Cybex Knee Extension 10lb  x10, RLE 5lb x10    Cybex Knee Flexion 25lb x10, RLE 15lb 2x10    Cybex Leg Press 30lb 2x10; RLE TKE 2x10      Knee/Hip Exercises: Standing   Heel Raises Both;2 sets;15 reps;2 seconds    Forward Step Up 2 sets;10 reps;Hand Hold: 0;Step Height:  6";Both    Walking with Sports Cord 30lb 4 way x 3 each      Manual Therapy   Manual Therapy Joint mobilization;Passive ROM    Joint Mobilization gentle distraction    Passive ROM knee flexion/extension                    PT Short Term Goals - 04/21/21 1429      PT SHORT TERM GOAL #1   Title Pt will be I with initial HEP             PT Long Term Goals - 04/19/21 1142      PT LONG TERM GOAL #1   Title Pt will demo R knee flexion to 120    Status On-going                 Plan - 05/02/21 1510    Clinical Impression Statement Pt did well with the exercise interventions. Cues needed to control the excentrics phase with resisted gait. She has good strength with SL TKE on leg  press. RLE weakness noted with SL extensions. R knee pain reported at end range passive flexion. Postural compensation noted with passive R knee flexion.    Personal Factors and Comorbidities Comorbidity 2    Comorbidities asthma, OSA    Examination-Activity Limitations Locomotion Level;Transfers;Stairs;Stand    Examination-Participation Restrictions Community Activity;Interpersonal Relationship    Stability/Clinical Decision Making Evolving/Moderate complexity    Rehab Potential Good    PT Frequency 2x / week    PT Duration 8 weeks    PT Treatment/Interventions ADLs/Self Care Home Management;Electrical Stimulation;Cryotherapy;Neuromuscular re-education;Therapeutic exercise;Therapeutic activities;Functional mobility training;Stair training;Gait training;Patient/family education;Manual techniques;Passive range of motion;Vasopneumatic Device    PT Next Visit Plan knee flex/ext ROM, gait training, stair training, manual/modalities as indicated           Patient will benefit from skilled therapeutic intervention in order to improve the following deficits and impairments:  Abnormal gait,Difficulty walking,Decreased endurance,Increased muscle spasms,Decreased activity  tolerance,Pain,Hypomobility,Decreased mobility,Decreased strength,Increased edema  Visit Diagnosis: Acute pain of right knee  Stiffness of right knee, not elsewhere classified  Difficulty in walking, not elsewhere classified  Localized edema     Problem List Patient Active Problem List   Diagnosis Date Noted  . S/P total knee arthroplasty, right 03/15/2021  . Osteoarthritis of right knee 02/28/2021  . GERD (gastroesophageal reflux disease) 10/06/2020  . Anemia 10/06/2020  . Severe sepsis with lactic acidosis (HCC) 10/04/2020  . Dyslipidemia 10/04/2020  . OSA on CPAP 10/04/2020  . Obesity, Class III, BMI 40-49.9 (morbid obesity) (HCC) 10/04/2020  . Hypothyroidism 02/16/2009  . FRACTURE, GREAT TOE, LEFT 02/16/2009    Grayce Sessions 05/02/2021, 3:14 PM  [facility] [address]. [place], [place], [address] Phone: [phone]   Fax:  [phone]  Name: [name] MRN: [id number] Date of Birth: May 21, 1961

## 2021-05-04 ENCOUNTER — Other Ambulatory Visit: Payer: Self-pay

## 2021-05-04 ENCOUNTER — Encounter: Payer: Self-pay | Admitting: Physical Therapy

## 2021-05-04 ENCOUNTER — Ambulatory Visit: Payer: Medicaid Other | Admitting: Physical Therapy

## 2021-05-04 DIAGNOSIS — M25661 Stiffness of right knee, not elsewhere classified: Secondary | ICD-10-CM

## 2021-05-04 DIAGNOSIS — M25561 Pain in right knee: Secondary | ICD-10-CM | POA: Diagnosis not present

## 2021-05-04 DIAGNOSIS — R262 Difficulty in walking, not elsewhere classified: Secondary | ICD-10-CM

## 2021-05-04 DIAGNOSIS — R600 Localized edema: Secondary | ICD-10-CM

## 2021-05-04 NOTE — Therapy (Signed)
[facility] [place] [address]. [place], [place], [address] Phone: [phone]   Fax:  [phone]  Physical Therapy Treatment  Patient Details  Name: [name] MRN: [id number] Date of Birth: 12-10-1961 Referring Provider (PT): Ilean China Date: 05/04/2021   PT End of Session - 05/04/21 1508    Visit Number 9    Date for PT Re-Evaluation 06/22/21    PT Start Time 1430    PT Stop Time 1510    PT Time Calculation (min) 40 min    Activity Tolerance Patient tolerated treatment well           Past Medical History:  Diagnosis Date  . Arthritis   . Asthma   . GERD (gastroesophageal reflux disease)    pt denies   . Hyperlipemia 11/07/2011   . Hypothyroidism   . OSA on CPAP   . Thyroid disease     Past Surgical History:  Procedure Laterality Date  . ABDOMINAL HYSTERECTOMY    . CHOLECYSTECTOMY    . TOTAL KNEE ARTHROPLASTY Right 03/15/2021   Procedure: TOTAL KNEE ARTHROPLASTY;  Surgeon: Sheral Apley, MD;  Location: [facility];  Service: Orthopedics;  Laterality: Right;    There were no vitals filed for this visit.   Subjective Assessment - 05/04/21 1431    Subjective Feeling ok    Currently in Pain? Yes    Pain Score 5     Pain Location Knee                             [facility] Adult PT Treatment/Exercise - 05/04/21 0001      Knee/Hip Exercises: Aerobic   Nustep L4 x 6 min LE only      Knee/Hip Exercises: Machines for Strengthening   Cybex Knee Extension 10lb  x10, RLE 5lb x10    Cybex Knee Flexion 25lb x10, RLE 15lb 2x10      Knee/Hip Exercises: Standing   Lateral Step Up Right;2 sets;10 reps;Hand Hold: 0;Step Height: 6"    Walking with Sports Cord 30lb 4 way x 3 each      Knee/Hip Exercises: Seated   Long Arc Quad Right;10 reps;3 sets    [employer] Weight 5 lbs.    Sit to Sand 2 sets;10 reps;without UE support   LE on airex                   PT Short Term Goals  - 04/21/21 1429      PT SHORT TERM GOAL #1   Title Pt will be I with initial HEP             PT Long Term Goals - 04/19/21 1142      PT LONG TERM GOAL #1   Title Pt will demo R knee flexion to 120    Status On-going                 Plan - 05/04/21 1509    Clinical Impression Statement Pt tolerated today's session well with no issues. Cues needed to keep hips square with resisted side steps. Some compensation noted with lateral step ups. Tactile cue given with sit to stands for appropriate LE alignment. RLE weakness noted with machine level single leg extensions.    Personal Factors and Comorbidities Comorbidity 2    Comorbidities asthma, OSA    Examination-Activity Limitations Locomotion Level;Transfers;Stairs;Stand    Examination-Participation  Restrictions Community Activity;Interpersonal Relationship    Stability/Clinical Decision Making Evolving/Moderate complexity    Rehab Potential Good    PT Duration 8 weeks    PT Treatment/Interventions ADLs/Self Care Home Management;Electrical Stimulation;Cryotherapy;Neuromuscular re-education;Therapeutic exercise;Therapeutic activities;Functional mobility training;Stair training;Gait training;Patient/family education;Manual techniques;Passive range of motion;Vasopneumatic Device    PT Next Visit Plan knee flex/ext ROM, gait training, stair training, manual/modalities as indicated           Patient will benefit from skilled therapeutic intervention in order to improve the following deficits and impairments:  Abnormal gait,Difficulty walking,Decreased endurance,Increased muscle spasms,Decreased activity tolerance,Pain,Hypomobility,Decreased mobility,Decreased strength,Increased edema  Visit Diagnosis: Difficulty in walking, not elsewhere classified  Localized edema  Stiffness of right knee, not elsewhere classified  Acute pain of right knee     Problem List Patient Active Problem List   Diagnosis Date Noted  . S/P  total knee arthroplasty, right 03/15/2021  . Osteoarthritis of right knee 02/28/2021  . GERD (gastroesophageal reflux disease) 10/06/2020  . Anemia 10/06/2020  . Severe sepsis with lactic acidosis (HCC) 10/04/2020  . Dyslipidemia 10/04/2020  . OSA on CPAP 10/04/2020  . Obesity, Class III, BMI 40-49.9 (morbid obesity) (HCC) 10/04/2020  . Hypothyroidism 02/16/2009  . FRACTURE, GREAT TOE, LEFT 02/16/2009    Grayce Sessions, PTA 05/04/2021, 3:12 PM  [facility] [address] [facility]. [place], [place], [address] Phone: [phone]   Fax:  [phone]  Name: [name] MRN: [id number] Date of Birth: 1961-01-20

## 2021-05-09 ENCOUNTER — Encounter: Payer: Self-pay | Admitting: Physical Therapy

## 2021-05-09 ENCOUNTER — Ambulatory Visit: Payer: Medicaid Other | Admitting: Physical Therapy

## 2021-05-09 ENCOUNTER — Other Ambulatory Visit: Payer: Self-pay

## 2021-05-09 DIAGNOSIS — M25561 Pain in right knee: Secondary | ICD-10-CM

## 2021-05-09 DIAGNOSIS — R262 Difficulty in walking, not elsewhere classified: Secondary | ICD-10-CM

## 2021-05-09 DIAGNOSIS — R600 Localized edema: Secondary | ICD-10-CM

## 2021-05-09 DIAGNOSIS — M25661 Stiffness of right knee, not elsewhere classified: Secondary | ICD-10-CM

## 2021-05-09 NOTE — Therapy (Signed)
[facility] [place] [address]. [place], [place], [address] Phone: [phone]   Fax:  [phone]  Physical Therapy Treatment  Patient Details  Name: [name] MRN: [id number] Date of Birth: 1961-05-04 Referring Provider (PT): Ilean China Date: 05/09/2021   PT End of Session - 05/09/21 1508    Visit Number 10    Date for PT Re-Evaluation 06/22/21    PT Start Time 1427    PT Stop Time 1508    PT Time Calculation (min) 41 min    Activity Tolerance Patient tolerated treatment well    Behavior During Therapy [facility] for tasks assessed/performed           Past Medical History:  Diagnosis Date  . Arthritis   . Asthma   . GERD (gastroesophageal reflux disease)    pt denies   . Hyperlipemia 11/07/2011   . Hypothyroidism   . OSA on CPAP   . Thyroid disease     Past Surgical History:  Procedure Laterality Date  . ABDOMINAL HYSTERECTOMY    . CHOLECYSTECTOMY    . TOTAL KNEE ARTHROPLASTY Right 03/15/2021   Procedure: TOTAL KNEE ARTHROPLASTY;  Surgeon: Sheral Apley, MD;  Location: [facility];  Service: Orthopedics;  Laterality: Right;    There were no vitals filed for this visit.   Subjective Assessment - 05/09/21 1425    Subjective "Im good"    Pertinent History asthma, OSA with cpap    How long can you walk comfortably? prior to surgery states could walk around house and grocery store    Currently in Pain? No/denies              [facility] PT Assessment - 05/09/21 0001      AROM   Right Knee Extension 2    Right Knee Flexion 89                         [facility] Adult PT Treatment/Exercise - 05/09/21 0001      Ambulation/Gait   Stairs Yes    Stairs Assistance 6: Modified independent (Device/Increase time)    Stair Management Technique No rails;Alternating pattern;Step to pattern    Number of Stairs 20    Height of Stairs --   4&6   Gait Comments cues to maintain alt patttern      Knee/Hip  Exercises: Aerobic   Nustep L4 x 6 min LE only      Knee/Hip Exercises: Machines for Strengthening   Cybex Knee Extension 10lb 2 x10, RLE 5lb eccentrics  x10    Cybex Knee Flexion 25lb 2x10, RLE 15lb 2x10    Cybex Leg Press 30lb 2x10; RLE 20lb TKE 2x10      Knee/Hip Exercises: Standing   Forward Step Up Right;2 sets;5 reps;10 reps;Step Height: 8";Hand Hold: 0    Step Down Right;1 set;10 reps;Hand Hold: 2;Step Height: 6"      Knee/Hip Exercises: Seated   Long Arc Quad Right;2 sets;15 reps    Long Arc Quad Weight 5 lbs.    Sit to Sand 2 sets;10 reps;without UE support   OHP yellow ball, on airex                   PT Short Term Goals - 04/21/21 1429      PT SHORT TERM GOAL #1   Title Pt will be I with initial HEP  PT Long Term Goals - 04/19/21 1142      PT LONG TERM GOAL #1   Title Pt will demo R knee flexion to 120    Status On-going                 Plan - 05/09/21 1503    Clinical Impression Statement Pt did well in today's therapy session. Progressed to stair negotiation without rail maintain alternating pattern. Some RLE eccentric load weakness noted with descending stairs. Unable to perform SL extension with machine due to weakness, but tolerated eccentrics well. Able to complete 8 inch step up but some compensation needed. Increase resistance tolerated on leg press.    Personal Factors and Comorbidities Comorbidity 2    Comorbidities asthma, OSA    Examination-Activity Limitations Locomotion Level;Transfers;Stairs;Stand    Examination-Participation Restrictions Community Activity;Interpersonal Relationship    Stability/Clinical Decision Making Evolving/Moderate complexity    Rehab Potential Good    PT Frequency 2x / week    PT Duration 8 weeks    PT Treatment/Interventions ADLs/Self Care Home Management;Electrical Stimulation;Cryotherapy;Neuromuscular re-education;Therapeutic exercise;Therapeutic activities;Functional mobility  training;Stair training;Gait training;Patient/family education;Manual techniques;Passive range of motion;Vasopneumatic Device    PT Next Visit Plan knee flex/ext ROM, gait training, stair training, manual/modalities as indicated           Patient will benefit from skilled therapeutic intervention in order to improve the following deficits and impairments:  Abnormal gait,Difficulty walking,Decreased endurance,Increased muscle spasms,Decreased activity tolerance,Pain,Hypomobility,Decreased mobility,Decreased strength,Increased edema  Visit Diagnosis: Difficulty in walking, not elsewhere classified  Localized edema  Stiffness of right knee, not elsewhere classified  Acute pain of right knee     Problem List Patient Active Problem List   Diagnosis Date Noted  . S/P total knee arthroplasty, right 03/15/2021  . Osteoarthritis of right knee 02/28/2021  . GERD (gastroesophageal reflux disease) 10/06/2020  . Anemia 10/06/2020  . Severe sepsis with lactic acidosis (HCC) 10/04/2020  . Dyslipidemia 10/04/2020  . OSA on CPAP 10/04/2020  . Obesity, Class III, BMI 40-49.9 (morbid obesity) (HCC) 10/04/2020  . Hypothyroidism 02/16/2009  . FRACTURE, GREAT TOE, LEFT 02/16/2009    Grayce Sessions, PTA 05/09/2021, 3:10 PM  [facility] [place] [facility] [address] [place], [place], [address] Phone: [phone]   Fax:  [phone]  Name: MADHAVI HAMBLEN MRN: [id number] Date of Birth: 27-Nov-1961

## 2021-05-11 ENCOUNTER — Ambulatory Visit: Payer: Medicaid Other | Admitting: Physical Therapy

## 2021-05-11 ENCOUNTER — Other Ambulatory Visit: Payer: Self-pay

## 2021-05-11 ENCOUNTER — Encounter: Payer: Self-pay | Admitting: Physical Therapy

## 2021-05-11 DIAGNOSIS — M25561 Pain in right knee: Secondary | ICD-10-CM | POA: Diagnosis not present

## 2021-05-11 DIAGNOSIS — M25661 Stiffness of right knee, not elsewhere classified: Secondary | ICD-10-CM

## 2021-05-11 DIAGNOSIS — R262 Difficulty in walking, not elsewhere classified: Secondary | ICD-10-CM

## 2021-05-11 DIAGNOSIS — R600 Localized edema: Secondary | ICD-10-CM

## 2021-05-11 NOTE — Therapy (Signed)
[facility] [place] [facility] [address] [place], [place], [address] Phone: [phone]   Fax:  [phone]  Physical Therapy Treatment  Patient Details  Name: [name] MRN: [id number] Date of Birth: [birth year] Referring Provider (PT): Ilean China Date: 05/11/2021   PT End of Session - 05/11/21 1506    Visit Number 11    Date for PT Re-Evaluation 06/22/21    PT Start Time 1430    PT Stop Time 1511    PT Time Calculation (min) 41 min    Activity Tolerance Patient tolerated treatment well    Behavior During Therapy [facility] for tasks assessed/performed           Past Medical History:  Diagnosis Date  . Arthritis   . Asthma   . GERD (gastroesophageal reflux disease)    pt denies   . Hyperlipemia 11/07/2011   . Hypothyroidism   . OSA on CPAP   . Thyroid disease     Past Surgical History:  Procedure Laterality Date  . ABDOMINAL HYSTERECTOMY    . CHOLECYSTECTOMY    . TOTAL KNEE ARTHROPLASTY Right 03/15/2021   Procedure: TOTAL KNEE ARTHROPLASTY;  Surgeon: Sheral Apley, MD;  Location: [facility];  Service: Orthopedics;  Laterality: Right;    There were no vitals filed for this visit.   Subjective Assessment - 05/11/21 1433    Subjective "All right"    Currently in Pain? No/denies                             [facility] Adult PT Treatment/Exercise - 05/11/21 0001      Ambulation/Gait   Ambulation/Gait Yes    Assistive device None    Gait Pattern Step-through pattern;Decreased arm swing - right;Decreased arm swing - left;Lateral trunk lean to right    Gait Comments outside around back building up and down slope      Knee/Hip Exercises: Machines for Strengthening   Cybex Leg Press 40lb 2x10      Knee/Hip Exercises: Standing   Forward Step Up Right;2 sets;5 reps;10 reps;Step Height: 8";Hand Hold: 0      Knee/Hip Exercises: Seated   Sit to Sand 2 sets;10 reps;without UE support   7lb                    PT Short Term Goals - 04/21/21 1429      PT SHORT TERM GOAL #1   Title Pt will be I with initial HEP             PT Long Term Goals - 04/19/21 1142      PT LONG TERM GOAL #1   Title Pt will demo R knee flexion to 120    Status On-going                 Plan - 05/11/21 1507    Clinical Impression Statement Pt is progressing towards goals. She was able to ambulate outside up and down slope over a  greater distance. Cues needed to slow gait speed with down hill ambulation. Some R lateral trunk lean during mid stance phase of RLE. No compensation noted with step ups. Cues to for TKE with LAQ and to hold contraction.    Personal Factors and Comorbidities Comorbidity 2    Comorbidities asthma, OSA    Examination-Activity Limitations Locomotion Level;Transfers;Stairs;Stand    Examination-Participation Restrictions Community Activity;Interpersonal Relationship  Stability/Clinical Decision Making Evolving/Moderate complexity    PT Frequency 2x / week    PT Duration 8 weeks    PT Treatment/Interventions ADLs/Self Care Home Management;Electrical Stimulation;Cryotherapy;Neuromuscular re-education;Therapeutic exercise;Therapeutic activities;Functional mobility training;Stair training;Gait training;Patient/family education;Manual techniques;Passive range of motion;Vasopneumatic Device    PT Next Visit Plan knee flex/ext ROM, gait training, stair training, manual/modalities as indicated           Patient will benefit from skilled therapeutic intervention in order to improve the following deficits and impairments:  Abnormal gait,Difficulty walking,Decreased endurance,Increased muscle spasms,Decreased activity tolerance,Pain,Hypomobility,Decreased mobility,Decreased strength,Increased edema  Visit Diagnosis: Stiffness of right knee, not elsewhere classified  Acute pain of right knee  Localized edema  Difficulty in walking, not elsewhere  classified     Problem List Patient Active Problem List   Diagnosis Date Noted  . S/P total knee arthroplasty, right 03/15/2021  . Osteoarthritis of right knee 02/28/2021  . GERD (gastroesophageal reflux disease) 10/06/2020  . Anemia 10/06/2020  . Severe sepsis with lactic acidosis (HCC) 10/04/2020  . Dyslipidemia 10/04/2020  . OSA on CPAP 10/04/2020  . Obesity, Class III, BMI 40-49.9 (morbid obesity) (HCC) 10/04/2020  . Hypothyroidism 02/16/2009  . FRACTURE, GREAT TOE, LEFT 02/16/2009    Grayce Sessions, PTA 05/11/2021, 3:10 PM  [facility] [place] [facility] [address] [facility] [address] [facility]. [place], [place], [address] Phone: [phone]   Fax:  [phone]  Name: [name] MRN: [id number] Date of Birth: 13-Jul-1961

## 2021-05-18 ENCOUNTER — Ambulatory Visit: Payer: Medicaid Other | Admitting: Physical Therapy

## 2021-05-20 ENCOUNTER — Other Ambulatory Visit: Payer: Self-pay

## 2021-05-20 ENCOUNTER — Ambulatory Visit: Payer: Medicaid Other | Attending: Orthopedic Surgery | Admitting: Physical Therapy

## 2021-05-20 ENCOUNTER — Encounter: Payer: Self-pay | Admitting: Physical Therapy

## 2021-05-20 DIAGNOSIS — M25561 Pain in right knee: Secondary | ICD-10-CM | POA: Diagnosis present

## 2021-05-20 DIAGNOSIS — R600 Localized edema: Secondary | ICD-10-CM | POA: Insufficient documentation

## 2021-05-20 DIAGNOSIS — R262 Difficulty in walking, not elsewhere classified: Secondary | ICD-10-CM | POA: Diagnosis present

## 2021-05-20 DIAGNOSIS — M25661 Stiffness of right knee, not elsewhere classified: Secondary | ICD-10-CM | POA: Diagnosis present

## 2021-05-20 NOTE — Therapy (Signed)
[address] [place], [place], [address] Phone: [phone]   Fax:  [phone]  Physical Therapy Treatment  Patient Details  Name: [name] MRN: [id number] Date of Birth: [birth year] Referring Provider (PT): Colin Broach Date: 05/20/2021   PT End of Session - 05/20/21 1127    Visit Number 12    Date for PT Re-Evaluation 06/22/21    PT Start Time 1055    PT Stop Time 1135    PT Time Calculation (min) 40 min    Activity Tolerance Patient tolerated treatment well    Behavior During Therapy [facility] for tasks assessed/performed           Past Medical History:  Diagnosis Date  . Arthritis   . Asthma   . GERD (gastroesophageal reflux disease)    pt denies   . Hyperlipemia 11/07/2011   . Hypothyroidism   . OSA on CPAP   . Thyroid disease     Past Surgical History:  Procedure Laterality Date  . ABDOMINAL HYSTERECTOMY    . CHOLECYSTECTOMY    . TOTAL KNEE ARTHROPLASTY Right 03/15/2021   Procedure: TOTAL KNEE ARTHROPLASTY;  Surgeon: Renette Butters, MD;  Location: [facility];  Service: Orthopedics;  Laterality: Right;    There were no vitals filed for this visit.   Subjective Assessment - 05/20/21 1055    Subjective "OK"    Currently in Pain? Yes    Pain Score 5     Pain Location Knee    Pain Orientation Right              [facility] PT Assessment - 05/20/21 0001      AROM   Right Knee Extension 1    Right Knee Flexion 95      PROM   Right Knee Extension 1    Right Knee Flexion 110                         [facility] Adult PT Treatment/Exercise - 05/20/21 0001      Ambulation/Gait   Ambulation/Gait Yes    Assistive device None    Gait Pattern Step-through pattern;Decreased arm swing - right;Decreased arm swing - left;Lateral trunk lean to right    Stairs Yes    Stairs Assistance 6: Modified independent (Device/Increase time)    Stair Management Technique One rail  Right;Alternating pattern    Number of Stairs 48    Height of Stairs 6    Gait Comments gati to back building 2 flights of stairs, back down and up hill.      Knee/Hip Exercises: Aerobic   Recumbent Bike L1 x 2 min partial revolutions      Knee/Hip Exercises: Machines for Strengthening   Cybex Knee Extension RLE 5lb 3x10    Cybex Knee Flexion 25lb 2x10, RLE 15lb 2x10    Cybex Leg Press 40lb 3x10      Knee/Hip Exercises: Standing   Heel Raises Both;2 sets;15 reps;2 seconds      Knee/Hip Exercises: Seated   Sit to Sand 3 sets;10 reps;without UE support   holding blue ball     Manual Therapy   Manual Therapy Passive ROM    Passive ROM knee flexion/extension                    PT Short Term Goals - 04/21/21 1429      PT SHORT TERM GOAL #1  Title Pt will be I with initial HEP             PT Long Term Goals - 05/20/21 1121      PT LONG TERM GOAL #1   Title Pt will demo R knee flexion to 120    Status On-going      PT LONG TERM GOAL #2   Title Pt will demo TUG with no AD <15 sec and gait WFL    Status Achieved      PT LONG TERM GOAL #3   Title Pt will demo full extension of R knee    Status Partially Met      PT LONG TERM GOAL #4   Title Pt will demo R knee circumferential edema within 2 cm of L knee    Status Achieved      PT LONG TERM GOAL #5   Title Pt will be I with advanced HEP    Status Achieved                 Plan - 05/20/21 1134    Clinical Impression Statement Pt progressed increasing her passive and active ROM. She eccentric load weakness noted when descending the stairs. Some lateral trunk lean to L with up hill ambulation. increase reps tolerated on leg press without issue. Some pain at he end range of flexion.    Personal Factors and Comorbidities Comorbidity 2    Comorbidities asthma, OSA    Examination-Activity Limitations Locomotion Level;Transfers;Stairs;Stand    Examination-Participation Restrictions Community  Activity;Interpersonal Relationship    Stability/Clinical Decision Making Evolving/Moderate complexity    Rehab Potential Good    PT Frequency 2x / week    PT Duration 8 weeks    PT Treatment/Interventions ADLs/Self Care Home Management;Electrical Stimulation;Cryotherapy;Neuromuscular re-education;Therapeutic exercise;Therapeutic activities;Functional mobility training;Stair training;Gait training;Patient/family education;Manual techniques;Passive range of motion;Vasopneumatic Device    PT Next Visit Plan knee flex/ext ROM, gait training, stair training, manual/modalities as indicated, Possible D/C           Patient will benefit from skilled therapeutic intervention in order to improve the following deficits and impairments:  Abnormal gait,Difficulty walking,Decreased endurance,Increased muscle spasms,Decreased activity tolerance,Pain,Hypomobility,Decreased mobility,Decreased strength,Increased edema  Visit Diagnosis: Localized edema  Difficulty in walking, not elsewhere classified  Acute pain of right knee  Stiffness of right knee, not elsewhere classified     Problem List Patient Active Problem List   Diagnosis Date Noted  . S/P total knee arthroplasty, right 03/15/2021  . Osteoarthritis of right knee 02/28/2021  . GERD (gastroesophageal reflux disease) 10/06/2020  . Anemia 10/06/2020  . Severe sepsis with lactic acidosis ([place]) 10/04/2020  . Dyslipidemia 10/04/2020  . OSA on CPAP 10/04/2020  . Obesity, Class III, BMI 40-49.9 (morbid obesity) ([place]) 10/04/2020  . Hypothyroidism 02/16/2009  . FRACTURE, GREAT TOE, LEFT 02/16/2009    Scot Jun 05/20/2021, 11:38 AM  [address]. [place], [place], [address] Phone: [phone]   Fax:  [phone]  Name: [name] MRN: [id number] Date of Birth: January 05, 1961

## 2021-05-30 ENCOUNTER — Ambulatory Visit: Payer: Medicaid Other | Admitting: Physical Therapy

## 2021-05-30 ENCOUNTER — Encounter: Payer: Self-pay | Admitting: Physical Therapy

## 2021-05-30 ENCOUNTER — Other Ambulatory Visit: Payer: Self-pay

## 2021-05-30 DIAGNOSIS — R600 Localized edema: Secondary | ICD-10-CM | POA: Diagnosis not present

## 2021-05-30 DIAGNOSIS — M25561 Pain in right knee: Secondary | ICD-10-CM

## 2021-05-30 DIAGNOSIS — M25661 Stiffness of right knee, not elsewhere classified: Secondary | ICD-10-CM

## 2021-05-30 DIAGNOSIS — R262 Difficulty in walking, not elsewhere classified: Secondary | ICD-10-CM

## 2021-05-30 NOTE — Therapy (Signed)
[address]. [place], [place], [address] Phone: [phone]   Fax:  [phone]  Physical Therapy Treatment PHYSICAL THERAPY DISCHARGE SUMMARY  Visits from Start of Care: 13   Patient agrees to discharge. Patient goals were partially met. Patient is being discharged due to being pleased with the current functional level.  Patient Details  Name: [name] MRN: [id number] Date of Birth: 1961/05/15 Referring Provider (PT): Colin Broach Date: 05/30/2021   PT End of Session - 05/30/21 1257     Visit Number 13    Date for PT Re-Evaluation 06/22/21    PT Start Time 1215    PT Stop Time 5638    PT Time Calculation (min) 41 min    Activity Tolerance Patient tolerated treatment well    Behavior During Therapy [facility] for tasks assessed/performed             Past Medical History:  Diagnosis Date   Arthritis    Asthma    GERD (gastroesophageal reflux disease)    pt denies    Hyperlipemia 11/07/2011    Hypothyroidism    OSA on CPAP    Thyroid disease     Past Surgical History:  Procedure Laterality Date   ABDOMINAL HYSTERECTOMY     CHOLECYSTECTOMY     TOTAL KNEE ARTHROPLASTY Right 03/15/2021   Procedure: TOTAL KNEE ARTHROPLASTY;  Surgeon: Renette Butters, MD;  Location: [facility];  Service: Orthopedics;  Laterality: Right;    There were no vitals filed for this visit.   Subjective Assessment - 05/30/21 1224     Subjective Pt reports she is doing much better overall; states no increase in soreness/pain after last session.    Currently in Pain? Yes    Pain Score 5     Pain Location Knee    Pain Orientation Right                [facility] PT Assessment - 05/30/21 0001       Observation/Other Assessments   Focus on Therapeutic Outcomes (FOTO)  80%      AROM   Right Knee Extension 1    Right Knee Flexion 100      PROM   Right Knee Extension 1    Right Knee Flexion 109                            [facility] Adult PT Treatment/Exercise - 05/30/21 0001       Knee/Hip Exercises: Stretches   Gastroc Stretch Both;1 rep;30 seconds      Knee/Hip Exercises: Aerobic   Recumbent Bike L1 x 5 min full revolutions seat position 6    Nustep L5 x 6 min      Knee/Hip Exercises: Machines for Strengthening   Cybex Knee Extension 10# BLE 1x10, RLE 5# 3x10    Cybex Knee Flexion 25lb 2x10, RLE 15lb 2x10    Cybex Leg Press 40lb 3x10 BLE; 2x10 heel raises      Knee/Hip Exercises: Standing   Heel Raises Both;2 sets;15 reps;2 seconds      Knee/Hip Exercises: Seated   Sit to Sand 3 sets;10 reps;without UE support   holding yellow weighted ball                     PT Short Term Goals - 04/21/21 1429       PT SHORT TERM GOAL #1  Title Pt will be I with initial HEP               PT Long Term Goals - 05/30/21 1248       PT LONG TERM GOAL #1   Title Pt will demo R knee flexion to 120    Baseline 110 PROM    Status Partially Met      PT LONG TERM GOAL #2   Title Pt will demo TUG with no AD <15 sec and gait WFL    Status Achieved      PT LONG TERM GOAL #3   Title Pt will demo full extension of R knee    Status Partially Met      PT LONG TERM GOAL #4   Title Pt will demo R knee circumferential edema within 2 cm of L knee    Status Achieved      PT LONG TERM GOAL #5   Title Pt will be I with advanced HEP    Status Achieved                   Plan - 05/30/21 1257     Clinical Impression Statement Pt recommended for discharge today secondary to being pleased with functional progress and meeting most rehab goals. Tolerated all ex's well with no increase in knee pain. Still demos some limitations in R knee flexion along with mild trendelenburg with gait. Educated on importance of continuing HEP along with when to return if signs/symptoms worsen or recur with pt VU and agreement.    PT Treatment/Interventions ADLs/Self Care Home  Management;Electrical Stimulation;Cryotherapy;Neuromuscular re-education;Therapeutic exercise;Therapeutic activities;Functional mobility training;Stair training;Gait training;Patient/family education;Manual techniques;Passive range of motion;Vasopneumatic Device    PT Next Visit Plan discharged    Consulted and Agree with Plan of Care Patient             Patient will benefit from skilled therapeutic intervention in order to improve the following deficits and impairments:  Abnormal gait, Difficulty walking, Decreased endurance, Increased muscle spasms, Decreased activity tolerance, Pain, Hypomobility, Decreased mobility, Decreased strength, Increased edema  Visit Diagnosis: Localized edema  Difficulty in walking, not elsewhere classified  Acute pain of right knee  Stiffness of right knee, not elsewhere classified     Problem List Patient Active Problem List   Diagnosis Date Noted   S/P total knee arthroplasty, right 03/15/2021   Osteoarthritis of right knee 02/28/2021   GERD (gastroesophageal reflux disease) 10/06/2020   Anemia 10/06/2020   Severe sepsis with lactic acidosis ([place]) 10/04/2020   Dyslipidemia 10/04/2020   OSA on CPAP 10/04/2020   Obesity, Class III, BMI 40-49.9 (morbid obesity) ([place]) 10/04/2020   Hypothyroidism 02/16/2009   FRACTURE, GREAT TOE, LEFT 02/16/2009   Amador Cunas, PT, DPT Donald Prose Iaan Oregel 05/30/2021, 1:00 PM  [address]. [place], [place], [address] Phone: [phone]   Fax:  [phone]  Name: [name] MRN: [id number] Date of Birth: 10/21/1961

## 2021-06-03 ENCOUNTER — Other Ambulatory Visit: Payer: Self-pay | Admitting: Physician Assistant

## 2021-06-03 DIAGNOSIS — Z1231 Encounter for screening mammogram for malignant neoplasm of breast: Secondary | ICD-10-CM

## 2021-06-07 ENCOUNTER — Other Ambulatory Visit: Payer: Self-pay | Admitting: Internal Medicine

## 2021-06-07 DIAGNOSIS — Z1231 Encounter for screening mammogram for malignant neoplasm of breast: Secondary | ICD-10-CM

## 2021-06-28 ENCOUNTER — Other Ambulatory Visit: Payer: Self-pay

## 2021-06-28 ENCOUNTER — Encounter (HOSPITAL_COMMUNITY): Payer: Self-pay | Admitting: Emergency Medicine

## 2021-06-28 ENCOUNTER — Ambulatory Visit (HOSPITAL_COMMUNITY)
Admission: EM | Admit: 2021-06-28 | Discharge: 2021-06-28 | Disposition: A | Discharge status: Home / Self Care | Payer: Medicaid Other | Attending: Family Medicine | Admitting: Family Medicine

## 2021-06-28 ENCOUNTER — Emergency Department (HOSPITAL_COMMUNITY)
Admission: EM | Admit: 2021-06-28 | Discharge: 2021-06-29 | Disposition: A | Discharge status: Home / Self Care | Payer: Medicaid Other | Attending: Emergency Medicine | Admitting: Emergency Medicine

## 2021-06-28 ENCOUNTER — Encounter (HOSPITAL_COMMUNITY): Payer: Self-pay

## 2021-06-28 DIAGNOSIS — R197 Diarrhea, unspecified: Secondary | ICD-10-CM

## 2021-06-28 DIAGNOSIS — R112 Nausea with vomiting, unspecified: Secondary | ICD-10-CM | POA: Diagnosis not present

## 2021-06-28 DIAGNOSIS — R1013 Epigastric pain: Secondary | ICD-10-CM | POA: Insufficient documentation

## 2021-06-28 DIAGNOSIS — E039 Hypothyroidism, unspecified: Secondary | ICD-10-CM | POA: Diagnosis not present

## 2021-06-28 DIAGNOSIS — Z96651 Presence of right artificial knee joint: Secondary | ICD-10-CM | POA: Insufficient documentation

## 2021-06-28 DIAGNOSIS — E860 Dehydration: Secondary | ICD-10-CM | POA: Diagnosis not present

## 2021-06-28 DIAGNOSIS — Z79899 Other long term (current) drug therapy: Secondary | ICD-10-CM | POA: Insufficient documentation

## 2021-06-28 DIAGNOSIS — Z7982 Long term (current) use of aspirin: Secondary | ICD-10-CM | POA: Diagnosis not present

## 2021-06-28 DIAGNOSIS — R1010 Upper abdominal pain, unspecified: Secondary | ICD-10-CM

## 2021-06-28 DIAGNOSIS — R109 Unspecified abdominal pain: Secondary | ICD-10-CM

## 2021-06-28 DIAGNOSIS — J45909 Unspecified asthma, uncomplicated: Secondary | ICD-10-CM | POA: Diagnosis not present

## 2021-06-28 DIAGNOSIS — R5383 Other fatigue: Secondary | ICD-10-CM

## 2021-06-28 DIAGNOSIS — U071 COVID-19: Secondary | ICD-10-CM | POA: Insufficient documentation

## 2021-06-28 LAB — COMPREHENSIVE METABOLIC PANEL
ALT: 32 U/L (ref 0–44)
AST: 34 U/L (ref 15–41)
Albumin: 3.8 g/dL (ref 3.5–5.0)
Alkaline Phosphatase: 23 U/L — ABNORMAL LOW (ref 38–126)
Anion gap: 12 (ref 5–15)
BUN: 10 mg/dL (ref 6–20)
CO2: 25 mmol/L (ref 22–32)
Calcium: 9.7 mg/dL (ref 8.9–10.3)
Chloride: 98 mmol/L (ref 98–111)
Creatinine, Ser: 0.78 mg/dL (ref 0.44–1.00)
GFR, Estimated: >60 mL/min (ref >60)
Glucose, Bld: 80 mg/dL (ref 70–99)
Potassium: 3.7 mmol/L (ref 3.5–5.1)
Sodium: 135 mmol/L (ref 135–145)
Total Bilirubin: 0.9 mg/dL (ref 0.3–1.2)
Total Protein: 8.1 g/dL (ref 6.5–8.1)

## 2021-06-28 LAB — POCT URINALYSIS DIPSTICK, ED / UC
Glucose, UA: NEGATIVE mg/dL
Ketones, ur: >=160 mg/dL — AB
Nitrite: NEGATIVE
Protein, ur: NEGATIVE mg/dL
Specific Gravity, Urine: >=1.03 (ref 1.005–1.030)
Urobilinogen, UA: 0.2 mg/dL (ref 0.0–1.0)
pH: 5.5 (ref 5.0–8.0)

## 2021-06-28 LAB — RESP PANEL BY RT-PCR (FLU A&B, COVID) ARPGX2
Influenza A by PCR: NEGATIVE
Influenza B by PCR: NEGATIVE
SARS Coronavirus 2 by RT PCR: POSITIVE — AB

## 2021-06-28 LAB — CBC WITH DIFFERENTIAL/PLATELET
Abs Immature Granulocytes: 0.02 10*3/uL (ref 0.00–0.07)
Basophils Absolute: 0 10*3/uL (ref 0.0–0.1)
Basophils Relative: 0 %
Eosinophils Absolute: 0 10*3/uL (ref 0.0–0.5)
Eosinophils Relative: 1 %
HCT: 37.2 % (ref 36.0–46.0)
Hemoglobin: 11.6 g/dL — ABNORMAL LOW (ref 12.0–15.0)
Immature Granulocytes: 0 %
Lymphocytes Relative: 42 %
Lymphs Abs: 2.3 10*3/uL (ref 0.7–4.0)
MCH: 23.1 pg — ABNORMAL LOW (ref 26.0–34.0)
MCHC: 31.2 g/dL (ref 30.0–36.0)
MCV: 74 fL — ABNORMAL LOW (ref 80.0–100.0)
Monocytes Absolute: 0.4 10*3/uL (ref 0.1–1.0)
Monocytes Relative: 7 %
Neutro Abs: 2.8 10*3/uL (ref 1.7–7.7)
Neutrophils Relative %: 50 %
Platelets: 312 10*3/uL (ref 150–400)
RBC: 5.03 MIL/uL (ref 3.87–5.11)
RDW: 13.6 % (ref 11.5–15.5)
WBC: 5.6 10*3/uL (ref 4.0–10.5)
nRBC: 0 % (ref 0.0–0.2)

## 2021-06-28 LAB — LIPASE, BLOOD: Lipase: 23 U/L (ref 11–51)

## 2021-06-28 MED ORDER — ONDANSETRON 4 MG PO TBDP
4.0000 mg | ORAL_TABLET | Freq: Once | ORAL | Status: AC
  Administered 2021-06-28: 4 mg via ORAL

## 2021-06-28 MED ORDER — ONDANSETRON 4 MG PO TBDP
ORAL_TABLET | ORAL | Status: AC
  Filled 2021-06-28: qty 1

## 2021-06-28 NOTE — ED Provider Notes (Signed)
Has aEmergency Medicine Provider Triage Evaluation Note  [name] , a 60 y.o. female  was evaluated in triage.  Pt complains of presents with abdominal pain, started on Saturday, she had nausea, vomiting, diarrhea, she denies hematemesis, hematuria, coffee-ground emesis or melena, she has noted some abdominal history, no recent hospitalizations, no recent antibiotic use, no history of C. difficile.  States she is vaccinated to COVID, no recent COVID contacts..  Review of Systems  Positive: Abdominal pain, nausea, vomiting Negative: Chest pain, shortness of breath  Physical Exam  BP 125/82 (BP Location: Right Arm)   Pulse (!) 103   Temp 99.4 F (37.4 C) (Oral)   Resp (!) 21   Ht 4\' 9"  (1.448 m)   SpO2 98%   BMI 39.34 kg/m  Gen:   Awake, no distress   Resp:  Normal effort  MSK:   Moves extremities without difficulty  Other:    Medical Decision Making  Medically screening exam initiated at 5:55 PM.  Appropriate orders placed.  [name] was informed that the remainder of the evaluation will be completed by another provider, this initial triage assessment does not replace that evaluation, and the importance of remaining in the ED until their evaluation is complete.  Patient presents with diarrhea-like illness, lab work has been ordered, patient need further work-up in the emergency department.   , PA-C 06/28/21 1756    08/29/21, DO 06/28/21 1937

## 2021-06-28 NOTE — ED Provider Notes (Signed)
[facility]    CSN: [id number] Arrival date & time: 06/28/21  1240      History   Chief Complaint Chief Complaint  Patient presents with   Abdominal Pain   Emesis   Diarrhea    HPI [name] is a 60 y.o. female.   Patient presenting today with her daughter for evaluation of 5-day history of worsening upper abdominal pain that she rates an 8 out of 10 at this point, intractable nausea vomiting and diarrhea.  Pain worsens when she tries to eat anything and she has been unable to keep anything down for the past 3 days or so where she was tolerating fluids the first 2 days.  Daughter has noticed increasing lethargy, mild disorientation the past 24 hours and brought her in.  Patient denies fever, blood or mucus in stools, lower abdominal pain, new foods, medications or supplements changes, recent sick contacts, history of chronic GI issues though GERD listed on her problem list.  Has not tried anything over-the-counter for symptoms thus far.   Past Medical History:  Diagnosis Date   Arthritis    Asthma    GERD (gastroesophageal reflux disease)    pt denies    Hyperlipemia 11/07/2011    Hypothyroidism    OSA on CPAP    Thyroid disease     Patient Active Problem List   Diagnosis Date Noted   S/P total knee arthroplasty, right 03/15/2021   Osteoarthritis of right knee 02/28/2021   GERD (gastroesophageal reflux disease) 10/06/2020   Anemia 10/06/2020   Severe sepsis with lactic acidosis ([facility]) 10/04/2020   Dyslipidemia 10/04/2020   OSA on CPAP 10/04/2020   Obesity, Class III, BMI 40-49.9 (morbid obesity) ([facility]) 10/04/2020   Hypothyroidism 02/16/2009   FRACTURE, GREAT TOE, LEFT 02/16/2009    Past Surgical History:  Procedure Laterality Date   ABDOMINAL HYSTERECTOMY     CHOLECYSTECTOMY     TOTAL KNEE ARTHROPLASTY Right 03/15/2021   Procedure: TOTAL KNEE ARTHROPLASTY;  Surgeon: Sheral Apley, MD;  Location: [facility];  Service: Orthopedics;  Laterality:  Right;    OB History   No obstetric history on file.      Home Medications    Prior to Admission medications   Medication Sig Start Date End Date Taking? Authorizing Provider  acetaminophen (TYLENOL) 500 MG tablet Take 1 tablet (500 mg total) by mouth every 6 (six) hours as needed for mild pain or moderate pain. 03/15/21   Jenne Pane, PA-C  aspirin EC 81 MG tablet Take 1 tablet (81 mg total) by mouth 2 (two) times daily. For DVT prophylaxis for 30 days after surgery. 03/15/21   Jenne Pane, PA-C  atorvastatin (LIPITOR) 40 MG tablet Take 40 mg by mouth at bedtime.     [provider]  ferrous sulfate 325 (65 FE) MG tablet Take 1 tablet (325 mg total) by mouth daily. Patient not taking: No sig reported 12/16/14   Doug Sou, MD  fluticasone [facility]) 50 MCG/ACT nasal spray Place 2 sprays into both nostrils daily as needed for allergies or rhinitis.    [provider]  hydrochlorothiazide (HYDRODIURIL) 25 MG tablet Take 25 mg by mouth daily. 02/18/21   [provider]  levothyroxine (SYNTHROID) 50 MCG tablet Take 1 tablet (50 mcg total) by mouth daily before breakfast. Patient taking differently: Take 100 mcg by mouth daily before breakfast. 10/09/20   Joseph Art, DO  meloxicam (MOBIC) 15 MG tablet Take 1 tablet (15 mg  total) by mouth daily. 03/15/21   Jenne Pane, PA-C  methocarbamol (ROBAXIN) 500 MG tablet Take 1 tablet (500 mg total) by mouth every 8 (eight) hours as needed for muscle spasms. 03/15/21   Jenne Pane, PA-C  omeprazole (PRILOSEC) 20 MG capsule Take 1 capsule (20 mg total) by mouth daily. 10/08/20   Joseph Art, DO  ondansetron (ZOFRAN) 4 MG tablet Take 1 tablet (4 mg total) by mouth daily as needed for nausea or vomiting. 03/15/21   Jenne Pane, PA-C  oxyCODONE (ROXICODONE) 5 MG immediate release tablet Take 1 tablet (5 mg total) by mouth every 8 (eight) hours as needed for severe pain. 03/15/21   Jenne Pane, PA-C   PRESCRIPTION MEDICATION See admin instructions. CPAP- At bedtime    [provider]  PROAIR HFA 108 (90 Base) MCG/ACT inhaler Inhale 2 puffs into the lungs in the morning, at noon, in the evening, and at bedtime.    [provider]  sucralfate (CARAFATE) 1 GM/10ML suspension Take 10 mLs (1 g total) by mouth 4 (four) times daily -  with meals and at bedtime. Patient not taking: No sig reported 10/08/20   Joseph Art, DO  SYMBICORT 80-4.5 MCG/ACT inhaler Inhale 2 puffs into the lungs 2 (two) times daily. 02/21/21   [provider]    Family History Family History  Problem Relation Age of Onset   Heart failure Mother    Diabetes Mother     Social History Social History   Tobacco Use   Smoking status: Never   Smokeless tobacco: Never  Vaping Use   Vaping Use: Never used  Substance Use Topics   Alcohol use: No   Drug use: No     Allergies   Patient has no known allergies.   Review of Systems Review of Systems Per HPI  Physical Exam Triage Vital Signs ED Triage Vitals  Enc Vitals Group     BP 06/28/21 1419 127/86     Pulse Rate 06/28/21 1419 99     Resp 06/28/21 1419 16     Temp 06/28/21 1419 99 F (37.2 C)     Temp Source 06/28/21 1419 Oral     SpO2 06/28/21 1419 100 %     Weight --      Height --      Head Circumference --      Peak Flow --      Pain Score 06/28/21 1418 8     Pain Loc --      Pain Edu? --      Excl. in GC? --    No data found.  Updated Vital Signs BP 127/86 (BP Location: Right Arm)   Pulse 99   Temp 99 F (37.2 C) (Oral)   Resp 16   SpO2 100%   Visual Acuity Right Eye Distance:   Left Eye Distance:   Bilateral Distance:    Right Eye Near:   Left Eye Near:    Bilateral Near:     Physical Exam Vitals and nursing note reviewed.  Constitutional:      Comments: Mildly lethargic, appears very weak and fatigued, moves very slowly and speaks slowly  HENT:     Head: Atraumatic.  Eyes:     Extraocular  Movements: Extraocular movements intact.     Conjunctiva/sclera: Conjunctivae normal.  Cardiovascular:     Rate and Rhythm: Normal rate and regular rhythm.     Heart sounds: Normal heart sounds.  Pulmonary:     Effort: Pulmonary effort is normal.     Breath sounds: Normal breath sounds.  Abdominal:     General: Bowel sounds are normal. There is no distension.     Palpations: Abdomen is soft. There is no mass.     Tenderness: There is abdominal tenderness. There is guarding. There is no right CVA tenderness, left CVA tenderness or rebound.     Comments: Mid and right upper quadrant tenderness to palpation with mild guarding  Musculoskeletal:     Cervical back: Normal range of motion and neck supple.     Right lower leg: No edema.     Left lower leg: No edema.     Comments: Per patient and daughter, gait at baseline since knee surgery 4 months ago.  Ambulates with a cane  Skin:    General: Skin is warm and dry.  Neurological:     Motor: Weakness present.     Comments: Patient appears oriented, but lethargic and very slow speech  Psychiatric:        Mood and Affect: Mood normal.        Thought Content: Thought content normal.        Judgment: Judgment normal.     UC Treatments / Results  Labs (all labs ordered are listed, but only abnormal results are displayed) Labs Reviewed  POCT URINALYSIS DIPSTICK, ED / UC - Abnormal; Notable for the following components:      Result Value   Bilirubin Urine SMALL (*)    Ketones, ur >=160 (*)    Hgb urine dipstick TRACE (*)    Leukocytes,Ua TRACE (*)    All other components within normal limits    EKG   Radiology No results found.  Procedures Procedures (including critical care time)  Medications Ordered in UC Medications  ondansetron (ZOFRAN-ODT) disintegrating tablet 4 mg (4 mg Oral Given 06/28/21 1549)    Initial Impression / Assessment and Plan / UC Course  I have reviewed the triage vital signs and the nursing  notes.  Pertinent labs & imaging results that were available during my care of the patient were reviewed by me and considered in my medical decision making (see chart for details).     Unclear etiology of her symptoms, possibly viral GI illness but given worsening course, complete intolerance to p.o. over the last few days, new disorientation and lethargy will refer to the emergency department for further evaluation and management.  Offered patient and daughter to do a panel of blood work here, p.o. challenge with Zofran, possibly IV fluids and monitor at home for the next 12 hours and go to the emergency department if worsening but they wish to go directly to the emergency department for further evaluation.  Daughter wishes to drive her via private vehicle.  She is hemodynamically stable for transport at this time.  Final Clinical Impressions(s) / UC Diagnoses   Final diagnoses:  Nausea vomiting and diarrhea  Pain of upper abdomen  Lethargy   Discharge Instructions   None    ED Prescriptions   None    PDMP not reviewed this encounter.   Particia Nearing, [place] 06/28/21 [phone]

## 2021-06-28 NOTE — ED Triage Notes (Signed)
Pt presents with c/o stomach pain and vomiting X 5 days.   Pt present with her daughter who states she has been feeling dizzy.

## 2021-06-28 NOTE — ED Triage Notes (Signed)
Pt endorses upper abd pain since Saturday with N/V.

## 2021-06-29 LAB — URINALYSIS, ROUTINE W REFLEX MICROSCOPIC
Bacteria, UA: NONE SEEN
Bilirubin Urine: NEGATIVE
Glucose, UA: NEGATIVE mg/dL
Hgb urine dipstick: NEGATIVE
Ketones, ur: 80 mg/dL — AB
Nitrite: NEGATIVE
Protein, ur: NEGATIVE mg/dL
Specific Gravity, Urine: 1.023 (ref 1.005–1.030)
pH: 5 (ref 5.0–8.0)

## 2021-06-29 MED ORDER — ONDANSETRON HCL 4 MG PO TABS: 4.0000 mg | ORAL_TABLET | Freq: Three times a day (TID) | ORAL | 0 refills | Status: DC | PRN

## 2021-06-29 MED ORDER — LACTATED RINGERS IV BOLUS
1000.0000 mL | Freq: Once | INTRAVENOUS | Status: AC
  Administered 2021-06-29: 1000 mL via INTRAVENOUS

## 2021-06-29 MED ORDER — ONDANSETRON HCL 4 MG/2ML IJ SOLN
4.0000 mg | Freq: Once | INTRAMUSCULAR | Status: DC
  Filled 2021-06-29: qty 2

## 2021-06-29 NOTE — ED Provider Notes (Signed)
[facility] EMERGENCY DEPARTMENT Provider Note   CSN: [id number] Arrival date & time: 06/28/21  1614     History Chief Complaint  Patient presents with   Abdominal Pain    [name] is a 60 y.o. female.   Abdominal Pain Associated symptoms: nausea   Associated symptoms: no shortness of breath   Patient presents with nausea vomiting upper abdominal pain.  Began around 3 days ago.  No sick contacts.  No diarrhea.  Pain is dull.  Pain is in the upper abdomen.  Has been vaccinated for COVID.  No fevers or chills.  Felt a little cold at times.  No blood in the emesis.  Patient had told me she still has her gallbladder but reviewing records appears to have had a cholecystectomy.    Past Medical History:  Diagnosis Date   Arthritis    Asthma    GERD (gastroesophageal reflux disease)    pt denies    Hyperlipemia 11/07/2011    Hypothyroidism    OSA on CPAP    Thyroid disease     Patient Active Problem List   Diagnosis Date Noted   S/P total knee arthroplasty, right 03/15/2021   Osteoarthritis of right knee 02/28/2021   GERD (gastroesophageal reflux disease) 10/06/2020   Anemia 10/06/2020   Severe sepsis with lactic acidosis (HCC) 10/04/2020   Dyslipidemia 10/04/2020   OSA on CPAP 10/04/2020   Obesity, Class III, BMI 40-49.9 (morbid obesity) (HCC) 10/04/2020   Hypothyroidism 02/16/2009   FRACTURE, GREAT TOE, LEFT 02/16/2009    Past Surgical History:  Procedure Laterality Date   ABDOMINAL HYSTERECTOMY     CHOLECYSTECTOMY     TOTAL KNEE ARTHROPLASTY Right 03/15/2021   Procedure: TOTAL KNEE ARTHROPLASTY;  Surgeon: Sheral Apley, MD;  Location: [facility];  Service: Orthopedics;  Laterality: Right;     OB History   No obstetric history on file.     Family History  Problem Relation Age of Onset   Heart failure Mother    Diabetes Mother     Social History   Tobacco Use   Smoking status: Never   Smokeless tobacco: Never  Vaping Use    Vaping Use: Never used  Substance Use Topics   Alcohol use: No   Drug use: No    Home Medications Prior to Admission medications   Medication Sig Start Date End Date Taking? Authorizing Provider  acetaminophen (TYLENOL) 500 MG tablet Take 1 tablet (500 mg total) by mouth every 6 (six) hours as needed for mild pain or moderate pain. 03/15/21   Jenne Pane, PA-C  aspirin EC 81 MG tablet Take 1 tablet (81 mg total) by mouth 2 (two) times daily. For DVT prophylaxis for 30 days after surgery. 03/15/21   Jenne Pane, PA-C  atorvastatin (LIPITOR) 40 MG tablet Take 40 mg by mouth at bedtime.     [provider]  ferrous sulfate 325 (65 FE) MG tablet Take 1 tablet (325 mg total) by mouth daily. Patient not taking: No sig reported 12/16/14   Doug Sou, MD  fluticasone [facility]) 50 MCG/ACT nasal spray Place 2 sprays into both nostrils daily as needed for allergies or rhinitis.    [provider]  hydrochlorothiazide (HYDRODIURIL) 25 MG tablet Take 25 mg by mouth daily. 02/18/21   [provider]  levothyroxine (SYNTHROID) 50 MCG tablet Take 1 tablet (50 mcg total) by mouth daily before breakfast. Patient taking differently: Take 100 mcg by mouth daily before  breakfast. 10/09/20   Joseph Art, DO  meloxicam (MOBIC) 15 MG tablet Take 1 tablet (15 mg total) by mouth daily. 03/15/21   Jenne Pane, PA-C  methocarbamol (ROBAXIN) 500 MG tablet Take 1 tablet (500 mg total) by mouth every 8 (eight) hours as needed for muscle spasms. 03/15/21   Jenne Pane, PA-C  omeprazole (PRILOSEC) 20 MG capsule Take 1 capsule (20 mg total) by mouth daily. 10/08/20   Joseph Art, DO  ondansetron (ZOFRAN) 4 MG tablet Take 1 tablet (4 mg total) by mouth every 8 (eight) hours as needed for nausea or vomiting. 06/29/21   Benjiman Core, MD  oxyCODONE (ROXICODONE) 5 MG immediate release tablet Take 1 tablet (5 mg total) by mouth every 8 (eight) hours as needed for severe pain.  03/15/21   Jenne Pane, PA-C  PRESCRIPTION MEDICATION See admin instructions. CPAP- At bedtime    [provider]  PROAIR HFA 108 (90 Base) MCG/ACT inhaler Inhale 2 puffs into the lungs in the morning, at noon, in the evening, and at bedtime.    [provider]  sucralfate (CARAFATE) 1 GM/10ML suspension Take 10 mLs (1 g total) by mouth 4 (four) times daily -  with meals and at bedtime. Patient not taking: No sig reported 10/08/20   Joseph Art, DO  SYMBICORT 80-4.5 MCG/ACT inhaler Inhale 2 puffs into the lungs 2 (two) times daily. 02/21/21   [provider]    Allergies    Patient has no known allergies.  Review of Systems   Review of Systems  Constitutional:  Positive for appetite change.  HENT:  Negative for congestion.   Respiratory:  Negative for chest tightness and shortness of breath.   Gastrointestinal:  Positive for abdominal pain and nausea.  Musculoskeletal:  Negative for back pain.  Skin:  Negative for rash.  Neurological:  Negative for weakness.  Psychiatric/Behavioral:  Negative for confusion.    Physical Exam Updated Vital Signs BP 110/64   Pulse 80   Temp 98.7 F (37.1 C) (Oral)   Resp 16   Ht 4\' 9"  (1.448 m)   SpO2 99%   BMI 39.34 kg/m   Physical Exam Vitals and nursing note reviewed.  HENT:     Head: Normocephalic.  Cardiovascular:     Rate and Rhythm: Regular rhythm.  Pulmonary:     Effort: Pulmonary effort is normal.     Breath sounds: Normal breath sounds.  Abdominal:     Hernia: No hernia is present.     Comments: Mild epigastric tenderness out rebound or guarding.  No hernia palpated.  Skin:    General: Skin is warm.     Capillary Refill: Capillary refill takes less than 2 seconds.  Neurological:     Mental Status: She is alert and oriented to person, place, and time.    ED Results / Procedures / Treatments   Labs (all labs ordered are listed, but only abnormal results are displayed) Labs Reviewed  RESP  PANEL BY RT-PCR (FLU A&B, COVID) ARPGX2 - Abnormal; Notable for the following components:      Result Value   SARS Coronavirus 2 by RT PCR POSITIVE (*)    All other components within normal limits  COMPREHENSIVE METABOLIC PANEL - Abnormal; Notable for the following components:   Alkaline Phosphatase 23 (*)    All other components within normal limits  CBC WITH DIFFERENTIAL/PLATELET - Abnormal; Notable for the following components:   Hemoglobin 11.6 (*)  MCV 74.0 (*)    MCH 23.1 (*)    All other components within normal limits  URINALYSIS, ROUTINE W REFLEX MICROSCOPIC - Abnormal; Notable for the following components:   APPearance HAZY (*)    Ketones, ur 80 (*)    Leukocytes,Ua MODERATE (*)    All other components within normal limits  LIPASE, BLOOD    EKG None  Radiology No results found.  Procedures Procedures   Medications Ordered in ED Medications  ondansetron (ZOFRAN) injection 4 mg (4 mg Intravenous Not Given 06/29/21 0133)  lactated ringers bolus 1,000 mL (0 mLs Intravenous Stopped 06/29/21 0313)    ED Course  I have reviewed the triage vital signs and the nursing notes.  Pertinent labs & imaging results that were available during my care of the patient were reviewed by me and considered in my medical decision making (see chart for details).    MDM Rules/Calculators/A&P                          Patient presents with nausea vomiting and some upper abdominal pain.  Symptoms since Saturday with today being now Wednesday.  No respiratory issues.  No fever.  However did have positive COVID test.  Is vaccinated.  Has had previous cholecystectomy.  Lab work reassuring set for some dehydration.  Feels better after IV fluids.  Tolerated orals.  Appears stable for discharge home.  Discussed quarantining and isolation precautions. Final Clinical Impression(s) / ED Diagnoses Final diagnoses:  Abdominal pain  Non-intractable vomiting with nausea, unspecified vomiting type   Dehydration  COVID-19    Rx / DC Orders ED Discharge Orders          Ordered    ondansetron (ZOFRAN) 4 MG tablet  Every 8 hours PRN        06/29/21 0339             Benjiman Core, MD 06/29/21 [phone]

## 2021-06-29 NOTE — ED Notes (Signed)
Pt given ginger ale.  Drinking without issue at this time.  Verbalized understanding to alert staff if vomiting

## 2021-08-08 ENCOUNTER — Ambulatory Visit: Payer: Medicaid Other

## 2022-04-06 ENCOUNTER — Other Ambulatory Visit: Payer: Self-pay | Admitting: Internal Medicine

## 2022-04-06 DIAGNOSIS — Z1231 Encounter for screening mammogram for malignant neoplasm of breast: Secondary | ICD-10-CM

## 2022-04-19 ENCOUNTER — Institutional Professional Consult (permissible substitution): Payer: Medicaid Other | Admitting: Pulmonary Disease

## 2022-05-17 ENCOUNTER — Ambulatory Visit (INDEPENDENT_AMBULATORY_CARE_PROVIDER_SITE_OTHER): Payer: Medicaid Other | Admitting: Primary Care

## 2022-05-17 ENCOUNTER — Encounter: Payer: Self-pay | Admitting: Primary Care

## 2022-05-17 VITALS — BP 102/60 | HR 78 | Temp 98.4°F | Ht <= 58 in | Wt 160.0 lb

## 2022-05-17 DIAGNOSIS — Z9989 Dependence on other enabling machines and devices: Secondary | ICD-10-CM | POA: Diagnosis not present

## 2022-05-17 DIAGNOSIS — G4733 Obstructive sleep apnea (adult) (pediatric): Secondary | ICD-10-CM | POA: Diagnosis not present

## 2022-05-17 NOTE — Assessment & Plan Note (Addendum)
-   Overdue annual follow-up for OSA.  Patient is maintained on CPAP and reports compliance with use.  Airview download between 04/15/2022 and 05/14/2022 showed 97% compliance with use, average usage 3 hours 42 minutes.  Current pressure 5 to 15 cm H2O.  Patient experiencing large amount of air leaks.  No significant residual apneas, AHI 0.7.  No pressure changes needed today.  Recommend  mask fitting with DME company.  Renew CPAP supplies with [facility] [facility].  Patient to continue work on weight loss efforts.  She does not drive.  Follow-up in 3 months or sooner if needed.

## 2022-05-17 NOTE — Progress Notes (Signed)
@Patient  ID: [name], female    DOB: December 02, 1961, 61 y.o.   MRN: [id number]  Chief Complaint  Patient presents with   Consult    Has CPAP machine with memory card.  Needs more supplies.  CPAP machine works fine.    Referring provider: Trey Sailors, PA  HPI: 61 year old female, never smoked.  Past medical history significant for OSA on CPAP, GERD, hypothyroidism, dyslipidemia, obesity.  Patient of Dr. Ander Slade, last seen on 01/03/2019.  05/17/2022 Patient presents today for sleep consult/overdue OSA follow-up. She is doing well. No acute issues. She is wearing CPAP every night. She sleeps well at night. Typical bedtime is between 11pm-1am. It does not take her long to fall asleep. She wakes up twice a night. She starts her day 7-8am. She falls asleep easily during the day if not doing anything active. She does not drive. CPAP machine is working. She uses a full face mask. Her family member states that she goes to bed very late and wakes up early. She is always tired. Mask is leaking a lot. Unable to access compliance report today, she did not have SD card in her machine. DME is [employer]. [facility].   Airview download 04/15/2022 - 05/14/2022 Usage 29/30 days (97%); 15 days (50%) greater than 4 hours Average usage days used 3 hours and 42 minutes Pressure 5 to 15 cm H2O (10.4 cm H2O-95%) Air leaks 39.4 L/min AHI 0.7  Sleep questionnaire Symptoms- daytime sleepiness Prior sleep study- Yes Bedtime- 12am-1am Time to fall asleep- varies  Nocturnal awakenings- 1-2 times Out of bed/start of day- 7am or 11am Weight changes- Dont know Do you operate heavy machinery- NO Do you currently wear CPAP- Yes Do you current wear oxygen- No [facility]- 24  No Known Allergies  Immunization History  Administered Date(s) Administered   Influenza Inj Mdck Quad Pf 12/16/2018   PFIZER(Purple Top)SARS-COV-2 Vaccination 05/08/2020    Past Medical History:  Diagnosis Date   Arthritis     Asthma    GERD (gastroesophageal reflux disease)    pt denies    Hyperlipemia 11/07/2011    Hypothyroidism    OSA on CPAP    Thyroid disease     Tobacco History: Social History   Tobacco Use  Smoking Status Never  Smokeless Tobacco Never   Counseling given: Not Answered   Outpatient Medications Prior to Visit  Medication Sig Dispense Refill   acetaminophen (TYLENOL) 500 MG tablet Take 1 tablet (500 mg total) by mouth every 6 (six) hours as needed for mild pain or moderate pain. 60 tablet 0   atorvastatin (LIPITOR) 40 MG tablet Take 40 mg by mouth at bedtime.      hydrochlorothiazide (HYDRODIURIL) 25 MG tablet Take 25 mg by mouth daily.     levothyroxine (SYNTHROID) 50 MCG tablet Take 1 tablet (50 mcg total) by mouth daily before breakfast. (Patient taking differently: Take 100 mcg by mouth daily before breakfast.) 30 tablet 0   meloxicam (MOBIC) 15 MG tablet Take 1 tablet (15 mg total) by mouth daily. 30 tablet 0   omeprazole (PRILOSEC) 20 MG capsule 1 cap(s) orally once a day for 30 days     PRESCRIPTION MEDICATION See admin instructions. CPAP- At bedtime     PROAIR HFA 108 (90 Base) MCG/ACT inhaler Inhale 2 puffs into the lungs in the morning, at noon, in the evening, and at bedtime.     ferrous sulfate 325 (65 FE) MG tablet Take 1 tablet (325 mg total)  by mouth daily. (Patient not taking: No sig reported) 30 tablet 0   fluticasone (FLONASE) 50 MCG/ACT nasal spray Place 2 sprays into both nostrils daily as needed for allergies or rhinitis. (Patient not taking: Reported on 05/17/2022)     sucralfate (CARAFATE) 1 GM/10ML suspension Take 10 mLs (1 g total) by mouth 4 (four) times daily -  with meals and at bedtime. (Patient not taking: Reported on 03/08/2021) 420 mL 0   SYMBICORT 80-4.5 MCG/ACT inhaler Inhale 2 puffs into the lungs 2 (two) times daily. (Patient not taking: Reported on 05/17/2022)     aspirin EC 81 MG tablet Take 1 tablet (81 mg total) by mouth 2 (two) times daily. For  DVT prophylaxis for 30 days after surgery. (Patient not taking: Reported on 05/17/2022) 60 tablet 0   methocarbamol (ROBAXIN) 500 MG tablet Take 1 tablet (500 mg total) by mouth every 8 (eight) hours as needed for muscle spasms. (Patient not taking: Reported on 05/17/2022) 20 tablet 0   omeprazole (PRILOSEC) 20 MG capsule Take 1 capsule (20 mg total) by mouth daily. (Patient not taking: Reported on 05/17/2022) 30 capsule 0   ondansetron (ZOFRAN) 4 MG tablet Take 1 tablet (4 mg total) by mouth every 8 (eight) hours as needed for nausea or vomiting. (Patient not taking: Reported on 05/17/2022) 8 tablet 0   oxyCODONE (ROXICODONE) 5 MG immediate release tablet Take 1 tablet (5 mg total) by mouth every 8 (eight) hours as needed for severe pain. (Patient not taking: Reported on 05/17/2022) 21 tablet 0   No facility-administered medications prior to visit.   Review of Systems  Review of Systems  Constitutional:  Positive for fatigue.  HENT: Negative.    Respiratory: Negative.    Psychiatric/Behavioral:  Positive for sleep disturbance.     Physical Exam  BP 102/60 (BP Location: Left Arm, Patient Position: Sitting, Cuff Size: Large)   Pulse 78   Temp 98.4 F (36.9 C) (Oral)   Ht 4\' 9"  (1.448 m)   Wt 160 lb (72.6 kg)   SpO2 100%   BMI 34.62 kg/m  Physical Exam Constitutional:      Appearance: Normal appearance.  HENT:     Head: Normocephalic.  Cardiovascular:     Rate and Rhythm: Normal rate and regular rhythm.  Pulmonary:     Effort: Pulmonary effort is normal.     Breath sounds: Normal breath sounds.  Musculoskeletal:        General: Normal range of motion.  Skin:    General: Skin is warm and dry.  Neurological:     General: No focal deficit present.     Mental Status: She is alert and oriented to person, place, and time. Mental status is at baseline.  Psychiatric:        Mood and Affect: Mood normal.        Behavior: Behavior normal.        Thought Content: Thought content normal.         Judgment: Judgment normal.     Lab Results:  CBC    Component Value Date/Time   WBC 5.6 06/28/2021 1755   RBC 5.03 06/28/2021 1755   HGB 11.6 (L) 06/28/2021 1755   HCT 37.2 06/28/2021 1755   PLT 312 06/28/2021 1755   MCV 74.0 (L) 06/28/2021 1755   MCH 23.1 (L) 06/28/2021 1755   MCHC 31.2 06/28/2021 1755   RDW 13.6 06/28/2021 1755   LYMPHSABS 2.3 06/28/2021 1755   MONOABS 0.4 06/28/2021 1755  EOSABS 0.0 06/28/2021 1755   BASOSABS 0.0 06/28/2021 1755    BMET    Component Value Date/Time   NA 135 06/28/2021 1755   K 3.7 06/28/2021 1755   CL 98 06/28/2021 1755   CO2 25 06/28/2021 1755   GLUCOSE 80 06/28/2021 1755   BUN 10 06/28/2021 1755   CREATININE 0.78 06/28/2021 1755   CALCIUM 9.7 06/28/2021 1755   GFRNONAA >60 06/28/2021 1755   GFRAA >60 01/21/2017 0615    BNP No results found for: BNP  ProBNP No results found for: PROBNP  Imaging: No results found.   Assessment & Plan:   OSA on CPAP - Overdue annual follow-up for OSA.  Patient is maintained on CPAP and reports compliance with use.  Airview download between 04/15/2022 and 05/14/2022 showed 97% compliance with use, average usage 3 hours 42 minutes.  Current pressure 5 to 15 cm H2O.  Patient experiencing large amount of air leaks.  No significant residual apneas, AHI 0.7.  No pressure changes needed today.  Recommend  mask fitting with DME company.  Renew CPAP supplies with [facility] [facility].  Patient to continue work on weight loss efforts.  She does not drive.  Follow-up in 3 months or sooner if needed.   Martyn Ehrich, NP 05/17/2022

## 2022-05-17 NOTE — Patient Instructions (Addendum)
Recommendations - Continue her CPAP every night for 4 to 6 hours or longer - Do not drive experiencing excessive daytime sleepiness or fatigue - Please bring CPAP machine to your DME company [employer] for them to pull compliance download and for mask fitting  Address: [address] [place], [place] [address] Hours:  Open ? Closes 5?PM Phone: [phone]  Orders - Compliance download faxed to [place] [phone] - Mask fitting with DME company - CPAP supplies with [facility]  Follow-up - 3 months with Waynetta Sandy NP

## 2022-08-14 ENCOUNTER — Encounter: Payer: Medicaid Other | Admitting: Obstetrics and Gynecology

## 2022-08-17 ENCOUNTER — Ambulatory Visit: Payer: Medicaid Other | Admitting: Primary Care

## 2022-08-17 NOTE — Progress Notes (Deleted)
@Patient  ID: [name] , female    DOB: [birth year], 61 y.o.   MRN: [id number]  No chief complaint on file.   Referring provider: [id number], PA  HPI:  61 year old female, never smoked.  Past medical history significant for OSA on CPAP, GERD, hypothyroidism, dyslipidemia, obesity.  Patient of Dr. 77, last seen on 01/03/2019.  05/17/2022 Patient presents today for sleep consult/overdue OSA follow-up. She is doing well. No acute issues. She is wearing CPAP every night. She sleeps well at night. Typical bedtime is between 11pm-1am. It does not take her long to fall asleep. She wakes up twice a night. She starts her day 7-8am. She falls asleep easily during the day if not doing anything active. She does not drive. CPAP machine is working. She uses a full face mask. Her family member states that she goes to bed very late and wakes up early. She is always tired. Mask is leaking a lot. Unable to access compliance report today, she did not have SD card in her machine. DME is [employer]. [facility] [address].   Airview download 04/15/2022 - 05/14/2022 Usage 29/30 days (97%); 15 days (50%) greater than 4 hours Average usage days used 3 hours and 42 minutes Pressure 5 to 15 cm H2O (10.4 cm H2O-95%) Air leaks 39.4 L/min AHI 0.7  Sleep questionnaire Symptoms- daytime sleepiness Prior sleep study- Yes Bedtime- 12am-1am Time to fall asleep- varies  Nocturnal awakenings- 1-2 times Out of bed/start of day- 7am or 11am Weight changes- Dont know Do you operate heavy machinery- NO Do you currently wear CPAP- Yes Do you current wear oxygen- No [facility]- 24  Orders - Compliance download faxed to [facility] 05/16/2022 - Mask fitting with DME company - CPAP supplies with [facility] [facility]  08/17/2022 - Interim hx  Patient presents today for 3 month follow-up.    Airview download 05/18/2022 - 08/15/2022 Usage days 83/90 days (92%); 45 days (50%) greater than 4 hours Average usage 3 hours and  31 minutes Pressure 5 to 15 cm H2O (10.1 cm H2O) Air leaks 56.2 L/min (95%)  AHI 2.2     No Known Allergies  Immunization History  Administered Date(s) Administered   Influenza Inj Mdck Quad Pf 12/16/2018   PFIZER(Purple Top)SARS-COV-2 Vaccination 05/08/2020    Past Medical History:  Diagnosis Date   Arthritis    Asthma    GERD (gastroesophageal reflux disease)    pt denies    Hyperlipemia 11/07/2011    Hypothyroidism    OSA on CPAP    Thyroid disease     Tobacco History: Social History   Tobacco Use  Smoking Status Never  Smokeless Tobacco Never   Counseling given: Not Answered   Outpatient Medications Prior to Visit  Medication Sig Dispense Refill   acetaminophen (TYLENOL) 500 MG tablet Take 1 tablet (500 mg total) by mouth every 6 (six) hours as needed for mild pain or moderate pain. 60 tablet 0   atorvastatin (LIPITOR) 40 MG tablet Take 40 mg by mouth at bedtime.      ferrous sulfate 325 (65 FE) MG tablet Take 1 tablet (325 mg total) by mouth daily. (Patient not taking: No sig reported) 30 tablet 0   fluticasone (FLONASE) 50 MCG/ACT nasal spray Place 2 sprays into both nostrils daily as needed for allergies or rhinitis. (Patient not taking: Reported on 05/17/2022)     hydrochlorothiazide (HYDRODIURIL) 25 MG tablet Take 25 mg by mouth daily.     levothyroxine (SYNTHROID) 50 MCG tablet Take  1 tablet (50 mcg total) by mouth daily before breakfast. (Patient taking differently: Take 100 mcg by mouth daily before breakfast.) 30 tablet 0   meloxicam (MOBIC) 15 MG tablet Take 1 tablet (15 mg total) by mouth daily. 30 tablet 0   omeprazole (PRILOSEC) 20 MG capsule 1 cap(s) orally once a day for 30 days     PRESCRIPTION MEDICATION See admin instructions. CPAP- At bedtime     PROAIR HFA 108 (90 Base) MCG/ACT inhaler Inhale 2 puffs into the lungs in the morning, at noon, in the evening, and at bedtime.     sucralfate (CARAFATE) 1 GM/10ML suspension Take 10 mLs (1 g total) by  mouth 4 (four) times daily -  with meals and at bedtime. (Patient not taking: Reported on 03/08/2021) 420 mL 0   SYMBICORT 80-4.5 MCG/ACT inhaler Inhale 2 puffs into the lungs 2 (two) times daily. (Patient not taking: Reported on 05/17/2022)     No facility-administered medications prior to visit.      Review of Systems  Review of Systems   Physical Exam  There were no vitals taken for this visit. Physical Exam   Lab Results:  CBC    Component Value Date/Time   WBC 5.6 06/28/2021 1755   RBC 5.03 06/28/2021 1755   HGB 11.6 (L) 06/28/2021 1755   HCT 37.2 06/28/2021 1755   PLT 312 06/28/2021 1755   MCV 74.0 (L) 06/28/2021 1755   MCH 23.1 (L) 06/28/2021 1755   MCHC 31.2 06/28/2021 1755   RDW 13.6 06/28/2021 1755   LYMPHSABS 2.3 06/28/2021 1755   MONOABS 0.4 06/28/2021 1755   EOSABS 0.0 06/28/2021 1755   BASOSABS 0.0 06/28/2021 1755    BMET    Component Value Date/Time   NA 135 06/28/2021 1755   K 3.7 06/28/2021 1755   CL 98 06/28/2021 1755   CO2 25 06/28/2021 1755   GLUCOSE 80 06/28/2021 1755   BUN 10 06/28/2021 1755   CREATININE 0.78 06/28/2021 1755   CALCIUM 9.7 06/28/2021 1755   GFRNONAA >60 06/28/2021 1755   GFRAA >60 01/21/2017 0615    BNP No results found for: "BNP"  ProBNP No results found for: "PROBNP"  Imaging: No results found.   Assessment & Plan:   No problem-specific Assessment & Plan notes found for this encounter.     Glenford Bayley, NP 08/17/2022

## 2023-04-19 ENCOUNTER — Other Ambulatory Visit: Payer: Self-pay | Admitting: Physician Assistant

## 2023-04-19 DIAGNOSIS — Z1231 Encounter for screening mammogram for malignant neoplasm of breast: Secondary | ICD-10-CM

## 2023-06-29 ENCOUNTER — Emergency Department (HOSPITAL_COMMUNITY): Payer: Medicaid Other

## 2023-06-29 ENCOUNTER — Emergency Department (HOSPITAL_COMMUNITY)
Admission: EM | Admit: 2023-06-29 | Discharge: 2023-06-29 | Disposition: A | Discharge status: Home / Self Care | Payer: Medicaid Other | Attending: Emergency Medicine | Admitting: Emergency Medicine

## 2023-06-29 ENCOUNTER — Other Ambulatory Visit: Payer: Self-pay

## 2023-06-29 DIAGNOSIS — R42 Dizziness and giddiness: Secondary | ICD-10-CM | POA: Diagnosis not present

## 2023-06-29 DIAGNOSIS — E876 Hypokalemia: Secondary | ICD-10-CM | POA: Insufficient documentation

## 2023-06-29 LAB — CBC WITH DIFFERENTIAL/PLATELET
Abs Immature Granulocytes: 0.02 10*3/uL (ref 0.00–0.07)
Basophils Absolute: 0 10*3/uL (ref 0.0–0.1)
Basophils Relative: 0 %
Eosinophils Absolute: 0.1 10*3/uL (ref 0.0–0.5)
Eosinophils Relative: 1 %
HCT: 34.9 % — ABNORMAL LOW (ref 36.0–46.0)
Hemoglobin: 10.9 g/dL — ABNORMAL LOW (ref 12.0–15.0)
Immature Granulocytes: 0 %
Lymphocytes Relative: 30 %
Lymphs Abs: 2.4 10*3/uL (ref 0.7–4.0)
MCH: 24.2 pg — ABNORMAL LOW (ref 26.0–34.0)
MCHC: 31.2 g/dL (ref 30.0–36.0)
MCV: 77.4 fL — ABNORMAL LOW (ref 80.0–100.0)
Monocytes Absolute: 0.4 10*3/uL (ref 0.1–1.0)
Monocytes Relative: 5 %
Neutro Abs: 5.2 10*3/uL (ref 1.7–7.7)
Neutrophils Relative %: 64 %
Platelets: 307 10*3/uL (ref 150–400)
RBC: 4.51 MIL/uL (ref 3.87–5.11)
RDW: 14.9 % (ref 11.5–15.5)
WBC: 8.1 10*3/uL (ref 4.0–10.5)
nRBC: 0 % (ref 0.0–0.2)

## 2023-06-29 LAB — I-STAT VENOUS BLOOD GAS, ED
Acid-Base Excess: 2 mmol/L (ref 0.0–2.0)
Bicarbonate: 21.9 mmol/L (ref 20.0–28.0)
Calcium, Ion: 1.07 mmol/L — ABNORMAL LOW (ref 1.15–1.40)
HCT: 36 % (ref 36.0–46.0)
Hemoglobin: 12.2 g/dL (ref 12.0–15.0)
O2 Saturation: 96 %
Potassium: 3 mmol/L — ABNORMAL LOW (ref 3.5–5.1)
Sodium: 139 mmol/L (ref 135–145)
TCO2: 23 mmol/L (ref 22–32)
pCO2, Ven: 22.4 mmHg — ABNORMAL LOW (ref 44–60)
pH, Ven: 7.598 — ABNORMAL HIGH (ref 7.25–7.43)
pO2, Ven: 66 mmHg — ABNORMAL HIGH (ref 32–45)

## 2023-06-29 LAB — URINALYSIS, W/ REFLEX TO CULTURE (INFECTION SUSPECTED)
Bacteria, UA: NONE SEEN
Bilirubin Urine: NEGATIVE
Glucose, UA: NEGATIVE mg/dL
Hgb urine dipstick: NEGATIVE
Ketones, ur: NEGATIVE mg/dL
Leukocytes,Ua: NEGATIVE
Nitrite: NEGATIVE
Protein, ur: NEGATIVE mg/dL
Specific Gravity, Urine: 1.015 (ref 1.005–1.030)
pH: 6 (ref 5.0–8.0)

## 2023-06-29 LAB — TYPE AND SCREEN
ABO/RH(D): O POS
Antibody Screen: NEGATIVE

## 2023-06-29 LAB — COMPREHENSIVE METABOLIC PANEL
ALT: 17 U/L (ref 0–44)
AST: 21 U/L (ref 15–41)
Albumin: 3.4 g/dL — ABNORMAL LOW (ref 3.5–5.0)
Alkaline Phosphatase: 33 U/L — ABNORMAL LOW (ref 38–126)
Anion gap: 13 (ref 5–15)
BUN: 13 mg/dL (ref 8–23)
CO2: 18 mmol/L — ABNORMAL LOW (ref 22–32)
Calcium: 9.3 mg/dL (ref 8.9–10.3)
Chloride: 104 mmol/L (ref 98–111)
Creatinine, Ser: 0.7 mg/dL (ref 0.44–1.00)
GFR, Estimated: >60 mL/min (ref >60)
Glucose, Bld: 134 mg/dL — ABNORMAL HIGH (ref 70–99)
Potassium: 3 mmol/L — ABNORMAL LOW (ref 3.5–5.1)
Sodium: 135 mmol/L (ref 135–145)
Total Bilirubin: 0.3 mg/dL (ref 0.3–1.2)
Total Protein: 7.4 g/dL (ref 6.5–8.1)

## 2023-06-29 LAB — TROPONIN I (HIGH SENSITIVITY)
Troponin I (High Sensitivity): <2 ng/L (ref <18)
Troponin I (High Sensitivity): 2 ng/L (ref <18)

## 2023-06-29 LAB — CBG MONITORING, ED: Glucose-Capillary: 140 mg/dL — ABNORMAL HIGH (ref 70–99)

## 2023-06-29 LAB — LIPASE, BLOOD: Lipase: 29 U/L (ref 11–51)

## 2023-06-29 LAB — ETHANOL: Alcohol, Ethyl (B): <10 mg/dL (ref <10)

## 2023-06-29 MED ORDER — LORAZEPAM 2 MG/ML IJ SOLN
0.5000 mg | Freq: Once | INTRAMUSCULAR | Status: AC
  Administered 2023-06-29: 0.5 mg via INTRAVENOUS
  Filled 2023-06-29: qty 1

## 2023-06-29 MED ORDER — POTASSIUM CHLORIDE CRYS ER 20 MEQ PO TBCR
40.0000 meq | EXTENDED_RELEASE_TABLET | Freq: Once | ORAL | Status: AC
  Administered 2023-06-29: 40 meq via ORAL
  Filled 2023-06-29: qty 2

## 2023-06-29 MED ORDER — MECLIZINE HCL 25 MG PO TABS
25.0000 mg | ORAL_TABLET | Freq: Once | ORAL | Status: AC
  Administered 2023-06-29: 25 mg via ORAL
  Filled 2023-06-29: qty 1

## 2023-06-29 MED ORDER — MECLIZINE HCL 12.5 MG PO TABS: 12.5000 mg | ORAL_TABLET | Freq: Three times a day (TID) | ORAL | 0 refills | Status: AC | PRN

## 2023-06-29 NOTE — ED Provider Notes (Signed)
[place] EMERGENCY DEPARTMENT AT [facility] Provider Note   CSN: [id number] Arrival date & time: 06/29/23  1914     History  Chief Complaint  Patient presents with   Dizziness    Pt coming from home with confusion, dizziness, n/v and HA. Pt woke this way and was last seen normal at 0100 AM.     [name] is a 62 y.o. female.  62 year old female with prior medical history as detailed below presents for evaluation.  Patient complains of generalized weakness.  Patient reports that she woke up feeling this way.  She complains of headache, nausea, generalized weakness.  She reports that she felt fine yesterday when she went to bed.  She denies fever.  She denies chest pain or shortness of breath.  She complains of diffuse headache and diffuse generalized weakness.  She also appears to have some degree of dizziness or vertigo.  She reports that when she stands up or attempts to stand she feels like the room is spinning around her.  The history is provided by the patient, the EMS personnel and medical records.       Home Medications Prior to Admission medications   Medication Sig Start Date End Date Taking? Authorizing Provider  acetaminophen (TYLENOL) 500 MG tablet Take 1 tablet (500 mg total) by mouth every 6 (six) hours as needed for mild pain or moderate pain. 03/15/21   Jenne Pane, PA-C  atorvastatin (LIPITOR) 40 MG tablet Take 40 mg by mouth at bedtime.     [provider]  ferrous sulfate 325 (65 FE) MG tablet Take 1 tablet (325 mg total) by mouth daily. Patient not taking: No sig reported 12/16/14   Doug Sou, MD  fluticasone [facility]) 50 MCG/ACT nasal spray Place 2 sprays into both nostrils daily as needed for allergies or rhinitis. Patient not taking: Reported on 05/17/2022    [provider]  hydrochlorothiazide (HYDRODIURIL) 25 MG tablet Take 25 mg by mouth daily. 02/18/21   [provider]  levothyroxine (SYNTHROID)  50 MCG tablet Take 1 tablet (50 mcg total) by mouth daily before breakfast. Patient taking differently: Take 100 mcg by mouth daily before breakfast. 10/09/20   Joseph Art, DO  meloxicam (MOBIC) 15 MG tablet Take 1 tablet (15 mg total) by mouth daily. 03/15/21   Jenne Pane, PA-C  omeprazole (PRILOSEC) 20 MG capsule 1 cap(s) orally once a day for 30 days    [provider]  PRESCRIPTION MEDICATION See admin instructions. CPAP- At bedtime    [provider]  PROAIR HFA 108 (90 Base) MCG/ACT inhaler Inhale 2 puffs into the lungs in the morning, at noon, in the evening, and at bedtime.    [provider]  sucralfate (CARAFATE) 1 GM/10ML suspension Take 10 mLs (1 g total) by mouth 4 (four) times daily -  with meals and at bedtime. Patient not taking: Reported on 03/08/2021 10/08/20   Joseph Art, DO  SYMBICORT 80-4.5 MCG/ACT inhaler Inhale 2 puffs into the lungs 2 (two) times daily. Patient not taking: Reported on 05/17/2022 02/21/21   [provider]      Allergies    Patient has no known allergies.    Review of Systems   Review of Systems  All other systems reviewed and are negative.   Physical Exam Updated Vital Signs BP 126/69 (BP Location: Left Arm)   Pulse 74   Temp 98.3 F (36.8 C) (Oral)   Resp Marland Kitchen)  23   Ht 4\' 9"  (1.448 m)   Wt 77.1 kg   SpO2 100%   BMI 36.79 kg/m  Physical Exam Vitals and nursing note reviewed.  Constitutional:      General: She is not in acute distress.    Appearance: Normal appearance. She is well-developed.  HENT:     Head: Normocephalic and atraumatic.  Eyes:     Conjunctiva/sclera: Conjunctivae normal.     Pupils: Pupils are equal, round, and reactive to light.  Cardiovascular:     Rate and Rhythm: Normal rate and regular rhythm.     Heart sounds: Normal heart sounds.  Pulmonary:     Effort: Pulmonary effort is normal. No respiratory distress.     Breath sounds: Normal breath sounds.  Abdominal:      General: There is no distension.     Palpations: Abdomen is soft.     Tenderness: There is no abdominal tenderness.  Musculoskeletal:        General: No deformity. Normal range of motion.     Cervical back: Normal range of motion and neck supple.  Skin:    General: Skin is warm and dry.  Neurological:     General: No focal deficit present.     Mental Status: She is alert and oriented to person, place, and time.     Motor: Weakness present.     Comments: Alert, oriented x 3, soft speech, no facial droop, moves all 4 extremities equally, appears to be globally weak     ED Results / Procedures / Treatments   Labs (all labs ordered are listed, but only abnormal results are displayed) Labs Reviewed  CBG MONITORING, ED - Abnormal; Notable for the following components:      Result Value   Glucose-Capillary 140 (*)    All other components within normal limits  CBC WITH DIFFERENTIAL/PLATELET  COMPREHENSIVE METABOLIC PANEL  ETHANOL  URINALYSIS, W/ REFLEX TO CULTURE (INFECTION SUSPECTED)  LIPASE, BLOOD  I-STAT VENOUS BLOOD GAS, ED  TYPE AND SCREEN  TROPONIN I (HIGH SENSITIVITY)    EKG EKG Interpretation Date/Time:  Friday June 29 2023 10:03:15 EDT Ventricular Rate:  69 PR Interval:  172 QRS Duration:  101 QT Interval:  391 QTC Calculation: 419 R Axis:   18  Text Interpretation: Sinus rhythm Low voltage, precordial leads Confirmed by Kristine Royal [phone]) on 06/29/2023 10:08:17 AM  Radiology No results found.  Procedures Procedures    Medications Ordered in ED Medications - No data to display  ED Course/ Medical Decision Making/ A&P                             Medical Decision Making Amount and/or Complexity of Data Reviewed Labs: ordered. Radiology: ordered.  Risk Prescription drug management.    Medical Screen Complete  This patient presented to the ED with complaint of weakness, possible vertigo.  This complaint involves an extensive number of  treatment options. The initial differential diagnosis includes, but is not limited to, vertigo, CNS event such as stroke, metabolic abnormality, infection, etc.  This presentation is: Acute, Chronic, Self-Limited, Previously Undiagnosed, Uncertain Prognosis, Complicated, Systemic Symptoms, and Threat to Life/Bodily Function  Patient is reporting acute weakness and likely vertiginous complaints.  Onset of symptoms was this morning when she awoke.  She apparently felt fine last night when she went to bed.  Screening labs obtained including UA and baseline labs are without significant abnormality.  Patient's potassium  is mildly decreased at 3.0.  Supplementation given here in the ED.  CT imaging is without acute abnormality.  On reevaluation the patient continued to complain of significant dizziness and vertiginous symptoms with attempted standing.  MRI brain demonstrates no acute abnormality.  Patient feels significantly improved after administration of meclizine and small dose of Ativan.  Patient is now able to ambulate without difficulty.  She desires discharge home.  Importance of close follow-up is stressed.  Strict return precautions given and understood.   Additional history obtained: External records from outside sources obtained and reviewed including prior ED visits and prior Inpatient records.    Lab Tests:  I ordered and personally interpreted labs.    Imaging Studies ordered:  I ordered imaging studies including CT head, CT chest, chest x-ray, MRI brain I independently visualized and interpreted obtained imaging which showed NAD I agree with the radiologist interpretation.   Cardiac Monitoring:  The patient was maintained on a cardiac monitor.  I personally viewed and interpreted the cardiac monitor which showed an underlying rhythm of: NSR   Medicines ordered:  I ordered medication including Ativan, meclizine for suspected vertigo Reevaluation of the patient  after these medicines showed that the patient: improved    Problem List / ED Course:  Vertigo   Reevaluation:  After the interventions noted above, I reevaluated the patient and found that they have: improved   Disposition:  After consideration of the diagnostic results and the patients response to treatment, I feel that the patent would benefit from close outpatient follow-up.          Final Clinical Impression(s) / ED Diagnoses Final diagnoses:  Vertigo    Rx / DC Orders ED Discharge Orders          Ordered    meclizine (ANTIVERT) 12.5 MG tablet  3 times daily PRN        06/29/23 1537              Wynetta Fines, MD 06/29/23 1539

## 2023-06-29 NOTE — Discharge Instructions (Signed)
Return for any problem.  ?

## 2023-06-29 NOTE — ED Notes (Signed)
NT attempted to ambulate pt, pt stated she could not ambulate at this time, she still feels dizzy will try again a little later.

## 2023-06-29 NOTE — ED Notes (Signed)
When tech went to ambulate pt, she stated that she was feeling too dizzy to try and walk. Said she would try later.

## 2023-09-20 ENCOUNTER — Emergency Department (HOSPITAL_COMMUNITY): Payer: Medicaid Other

## 2023-09-20 ENCOUNTER — Other Ambulatory Visit: Payer: Self-pay

## 2023-09-20 ENCOUNTER — Encounter (HOSPITAL_COMMUNITY): Payer: Self-pay

## 2023-09-20 ENCOUNTER — Emergency Department (HOSPITAL_COMMUNITY)
Admission: EM | Admit: 2023-09-20 | Discharge: 2023-09-20 | Disposition: A | Discharge status: Home / Self Care | Payer: Medicaid Other | Attending: Emergency Medicine | Admitting: Emergency Medicine

## 2023-09-20 DIAGNOSIS — Z1152 Encounter for screening for COVID-19: Secondary | ICD-10-CM | POA: Diagnosis not present

## 2023-09-20 DIAGNOSIS — Z7951 Long term (current) use of inhaled steroids: Secondary | ICD-10-CM | POA: Diagnosis not present

## 2023-09-20 DIAGNOSIS — R519 Headache, unspecified: Secondary | ICD-10-CM | POA: Insufficient documentation

## 2023-09-20 DIAGNOSIS — J45909 Unspecified asthma, uncomplicated: Secondary | ICD-10-CM | POA: Diagnosis not present

## 2023-09-20 DIAGNOSIS — R112 Nausea with vomiting, unspecified: Secondary | ICD-10-CM | POA: Diagnosis not present

## 2023-09-20 DIAGNOSIS — M7918 Myalgia, other site: Secondary | ICD-10-CM

## 2023-09-20 DIAGNOSIS — R110 Nausea: Secondary | ICD-10-CM

## 2023-09-20 DIAGNOSIS — R1013 Epigastric pain: Secondary | ICD-10-CM | POA: Diagnosis not present

## 2023-09-20 DIAGNOSIS — R109 Unspecified abdominal pain: Secondary | ICD-10-CM | POA: Diagnosis present

## 2023-09-20 LAB — COMPREHENSIVE METABOLIC PANEL
ALT: 16 U/L (ref 0–44)
AST: 21 U/L (ref 15–41)
Albumin: 3.3 g/dL — ABNORMAL LOW (ref 3.5–5.0)
Alkaline Phosphatase: 29 U/L — ABNORMAL LOW (ref 38–126)
Anion gap: 8 (ref 5–15)
BUN: 13 mg/dL (ref 8–23)
CO2: 26 mmol/L (ref 22–32)
Calcium: 9.2 mg/dL (ref 8.9–10.3)
Chloride: 105 mmol/L (ref 98–111)
Creatinine, Ser: 0.82 mg/dL (ref 0.44–1.00)
GFR, Estimated: >60 mL/min (ref >60)
Glucose, Bld: 105 mg/dL — ABNORMAL HIGH (ref 70–99)
Potassium: 3.3 mmol/L — ABNORMAL LOW (ref 3.5–5.1)
Sodium: 139 mmol/L (ref 135–145)
Total Bilirubin: 0.6 mg/dL (ref 0.3–1.2)
Total Protein: 7.4 g/dL (ref 6.5–8.1)

## 2023-09-20 LAB — CBC WITH DIFFERENTIAL/PLATELET
Abs Immature Granulocytes: 0.02 10*3/uL (ref 0.00–0.07)
Basophils Absolute: 0 10*3/uL (ref 0.0–0.1)
Basophils Relative: 0 %
Eosinophils Absolute: 0 10*3/uL (ref 0.0–0.5)
Eosinophils Relative: 0 %
HCT: 34.1 % — ABNORMAL LOW (ref 36.0–46.0)
Hemoglobin: 10.6 g/dL — ABNORMAL LOW (ref 12.0–15.0)
Immature Granulocytes: 0 %
Lymphocytes Relative: 15 %
Lymphs Abs: 1.2 10*3/uL (ref 0.7–4.0)
MCH: 23.7 pg — ABNORMAL LOW (ref 26.0–34.0)
MCHC: 31.1 g/dL (ref 30.0–36.0)
MCV: 76.3 fL — ABNORMAL LOW (ref 80.0–100.0)
Monocytes Absolute: 0.4 10*3/uL (ref 0.1–1.0)
Monocytes Relative: 5 %
Neutro Abs: 6.3 10*3/uL (ref 1.7–7.7)
Neutrophils Relative %: 80 %
Platelets: 303 10*3/uL (ref 150–400)
RBC: 4.47 MIL/uL (ref 3.87–5.11)
RDW: 14 % (ref 11.5–15.5)
WBC: 7.9 10*3/uL (ref 4.0–10.5)
nRBC: 0 % (ref 0.0–0.2)

## 2023-09-20 LAB — URINALYSIS, ROUTINE W REFLEX MICROSCOPIC
Bilirubin Urine: NEGATIVE
Glucose, UA: NEGATIVE mg/dL
Hgb urine dipstick: NEGATIVE
Ketones, ur: NEGATIVE mg/dL
Leukocytes,Ua: NEGATIVE
Nitrite: NEGATIVE
Protein, ur: NEGATIVE mg/dL
Specific Gravity, Urine: 1.018 (ref 1.005–1.030)
pH: 7 (ref 5.0–8.0)

## 2023-09-20 LAB — RESP PANEL BY RT-PCR (RSV, FLU A&B, COVID)  RVPGX2
Influenza A by PCR: NEGATIVE
Influenza B by PCR: NEGATIVE
Resp Syncytial Virus by PCR: NEGATIVE
SARS Coronavirus 2 by RT PCR: NEGATIVE

## 2023-09-20 LAB — RAPID URINE DRUG SCREEN, HOSP PERFORMED
Amphetamines: NOT DETECTED
Barbiturates: NOT DETECTED
Benzodiazepines: NOT DETECTED
Cocaine: NOT DETECTED
Opiates: POSITIVE — AB
Tetrahydrocannabinol: NOT DETECTED

## 2023-09-20 LAB — LIPASE, BLOOD: Lipase: 24 U/L (ref 11–51)

## 2023-09-20 MED ORDER — IOHEXOL 350 MG/ML SOLN
85.0000 mL | Freq: Once | INTRAVENOUS | Status: AC | PRN
  Administered 2023-09-20: 85 mL via INTRAVENOUS

## 2023-09-20 MED ORDER — SODIUM CHLORIDE 0.9 % IV BOLUS
1000.0000 mL | Freq: Once | INTRAVENOUS | Status: AC
  Administered 2023-09-20: 1000 mL via INTRAVENOUS

## 2023-09-20 MED ORDER — ONDANSETRON HCL 4 MG/2ML IJ SOLN
4.0000 mg | Freq: Once | INTRAMUSCULAR | Status: AC
  Administered 2023-09-20: 4 mg via INTRAVENOUS
  Filled 2023-09-20: qty 2

## 2023-09-20 MED ORDER — MORPHINE SULFATE (PF) 4 MG/ML IV SOLN
4.0000 mg | Freq: Once | INTRAVENOUS | Status: AC
  Administered 2023-09-20: 4 mg via INTRAVENOUS
  Filled 2023-09-20: qty 1

## 2023-09-20 NOTE — ED Provider Notes (Signed)
Patient's care assumed at 3:00 PM.  Patient is pending CT angio chest and abdomen.  Patient complains of headache nausea ramping and bodyaches.  Patient is pending CT scans  CT angio head and neck no acute.  CT angio chest abdomen pelvis shows no acute changes.  Patient counseled on results.  Patient is advised to follow-up primary care physician for recheck.   Elson Areas, PA-C 09/20/23 1857    Royanne Foots, DO 09/24/23 2033

## 2023-09-20 NOTE — ED Triage Notes (Signed)
Patient [name] EMS from home with complaints of nausea, vomiting and abdominal pain for 1 day. She is unable to keep food or liquids down at this time. She also complaining of headache as well in route with EMS. Patient is alert and aware   4mg  zofram   650mg  of tylenol  18 rt AC  of NS   HR 65 104/70 137 CBG 99% on room air

## 2023-09-20 NOTE — ED Provider Notes (Signed)
[place] EMERGENCY DEPARTMENT AT [facility] Provider Note   CSN: [id number] Arrival date & time: 09/20/23  1233     History  Chief Complaint  Patient presents with   Abdominal Pain   Nausea   Emesis    [name] is a 62 y.o. female.  The history is provided by the patient, the spouse, medical records and the EMS personnel. No language interpreter was used.  Abdominal Pain Associated symptoms: vomiting   Emesis Associated symptoms: abdominal pain      62 year old female significant history of thyroid disease, hyperlipidemia, GERD, asthma, obesity, brought here via EMS from home with complaints of abdominal pain.  Patient endorsed diffuse headache that started since yesterday.  Headache is gradual onset starting the front and radiates towards the back of her head.  She also endorsed some chills, nausea and vomiting having upper abdominal pain and generalized fatigue since yesterday.  Symptom is persistent, headache is new.  No complaints of vision changes denies any focal numbness or focal weakness denies any urinary symptoms.  EMS did give patient Tylenol as well as Zofran and IV fluid prior to arrival.  Home Medications Prior to Admission medications   Medication Sig Start Date End Date Taking? Authorizing Provider  acetaminophen (TYLENOL) 500 MG tablet Take 1 tablet (500 mg total) by mouth every 6 (six) hours as needed for mild pain or moderate pain. 03/15/21   Jenne Pane, PA-C  atorvastatin (LIPITOR) 40 MG tablet Take 40 mg by mouth at bedtime.     [provider]  ferrous sulfate 325 (65 FE) MG tablet Take 1 tablet (325 mg total) by mouth daily. Patient not taking: No sig reported 12/16/14   Doug Sou, MD  fluticasone [facility]) 50 MCG/ACT nasal spray Place 2 sprays into both nostrils daily as needed for allergies or rhinitis. Patient not taking: Reported on 05/17/2022    [provider]  hydrochlorothiazide (HYDRODIURIL) 25 MG  tablet Take 25 mg by mouth daily. 02/18/21   [provider]  levothyroxine (SYNTHROID) 50 MCG tablet Take 1 tablet (50 mcg total) by mouth daily before breakfast. Patient taking differently: Take 100 mcg by mouth daily before breakfast. 10/09/20   Joseph Art, DO  meclizine (ANTIVERT) 12.5 MG tablet Take 1 tablet (12.5 mg total) by mouth 3 (three) times daily as needed for dizziness. 06/29/23   Wynetta Fines, MD  meloxicam (MOBIC) 15 MG tablet Take 1 tablet (15 mg total) by mouth daily. 03/15/21   Jenne Pane, PA-C  omeprazole (PRILOSEC) 20 MG capsule 1 cap(s) orally once a day for 30 days    [provider]  PRESCRIPTION MEDICATION See admin instructions. CPAP- At bedtime    [provider]  PROAIR HFA 108 (90 Base) MCG/ACT inhaler Inhale 2 puffs into the lungs in the morning, at noon, in the evening, and at bedtime.    [provider]  sucralfate (CARAFATE) 1 GM/10ML suspension Take 10 mLs (1 g total) by mouth 4 (four) times daily -  with meals and at bedtime. Patient not taking: Reported on 03/08/2021 10/08/20   Joseph Art, DO  SYMBICORT 80-4.5 MCG/ACT inhaler Inhale 2 puffs into the lungs 2 (two) times daily. Patient not taking: Reported on 05/17/2022 02/21/21   [provider]      Allergies    Patient has no known allergies.    Review of Systems   Review of Systems  Gastrointestinal:  Positive for abdominal pain and  vomiting.  All other systems reviewed and are negative.   Physical Exam Updated Vital Signs BP 120/70 (BP Location: Left Arm)   Pulse 67   Temp 98.2 F (36.8 C) (Oral)   Resp (!) 25   SpO2 100%  Physical Exam Vitals and nursing note reviewed.  Constitutional:      General: She is not in acute distress.    Appearance: She is well-developed.     Comments: Patient appears uncomfortable with her eyes closed laying in bed she is nontoxic   HENT:     Head: Normocephalic and atraumatic.     Mouth/Throat:      Mouth: Mucous membranes are moist.  Eyes:     Extraocular Movements: Extraocular movements intact.     Conjunctiva/sclera: Conjunctivae normal.     Pupils: Pupils are equal, round, and reactive to light.  Cardiovascular:     Rate and Rhythm: Normal rate and regular rhythm.     Pulses: Normal pulses.     Heart sounds: Normal heart sounds.  Pulmonary:     Effort: Pulmonary effort is normal.  Abdominal:     Palpations: Abdomen is soft.     Tenderness: There is abdominal tenderness (Mild epigastric tenderness no guarding no rebound tenderness).  Musculoskeletal:     Cervical back: Normal range of motion and neck supple. No rigidity or tenderness.  Skin:    Findings: No rash.  Neurological:     Mental Status: She is alert.     GCS: GCS eye subscore is 4. GCS verbal subscore is 5. GCS motor subscore is 6.     Cranial Nerves: Cranial nerves 2-12 are intact.     Sensory: Sensation is intact.     Motor: Motor function is intact.     Coordination: Coordination is intact.  Psychiatric:        Mood and Affect: Mood normal.     ED Results / Procedures / Treatments   Labs (all labs ordered are listed, but only abnormal results are displayed) Labs Reviewed  CBC WITH DIFFERENTIAL/PLATELET - Abnormal; Notable for the following components:      Result Value   Hemoglobin 10.6 (*)    HCT 34.1 (*)    MCV 76.3 (*)    MCH 23.7 (*)    All other components within normal limits  COMPREHENSIVE METABOLIC PANEL - Abnormal; Notable for the following components:   Potassium 3.3 (*)    Glucose, Bld 105 (*)    Albumin 3.3 (*)    Alkaline Phosphatase 29 (*)    All other components within normal limits  RESP PANEL BY RT-PCR (RSV, FLU A&B, COVID)  RVPGX2  LIPASE, BLOOD  URINALYSIS, ROUTINE W REFLEX MICROSCOPIC  RAPID URINE DRUG SCREEN, HOSP PERFORMED    EKG None  Radiology No results found.  Procedures Procedures    Medications Ordered in ED Medications  ondansetron (ZOFRAN) injection 4  mg (4 mg Intravenous Given 09/20/23 1322)  morphine (PF) 4 MG/ML injection 4 mg (4 mg Intravenous Given 09/20/23 1326)  sodium chloride 0.9 % bolus 1,000 mL (0 mLs Intravenous Stopped 09/20/23 1450)  iohexol (OMNIPAQUE) 350 MG/ML injection 85 mL (85 mLs Intravenous Contrast Given 09/20/23 1522)    ED Course/ Medical Decision Making/ A&P                                 Medical Decision Making Amount and/or Complexity of Data Reviewed Labs: ordered.  Radiology: ordered.  Risk Prescription drug management.   BP 120/70 (BP Location: Left Arm)   Pulse 67   Temp 98.2 F (36.8 C) (Oral)   Resp (!) 25   SpO2 100%   61:49 PM 62 year old female significant history of thyroid disease, hyperlipidemia, GERD, asthma, obesity, brought here via EMS from home with complaints of abdominal pain.  Patient endorsed diffuse headache that started since yesterday.  Headache is gradual onset starting the front and radiates towards the back of her head.  She also endorsed some chills, nausea and vomiting having upper abdominal pain and generalized fatigue since yesterday.  Symptom is persistent, headache is new.  No complaints of vision changes denies any focal numbness or focal weakness denies any urinary symptoms.  EMS did give patient Tylenol as well as Zofran and IV fluid prior to arrival.  On exam patient is laying bed appears uncomfortable with eyes closed.  She does not exhibit any nuchal rigidity concerning for meningitis.  She is without any focal neurodeficit.  Abdominal exam with some epigastric tenderness but no guarding or rebound tenderness.  Heart with normal rate and rhythm, lungs are clear to auscultation.  She has equal strength to all 4 extremities.  Vital sign review overall reassuring no fever no hypoxia.  Nurse notified that patient appears much more uncomfortable after she received additional dose of morphine.  On reexamination she complaining of pain to her chest and back as well.  Given  her presentation, will obtain CTA of the head and neck as well as CT of the chest abdomen pelvis to rule out any dissection or other acute emergent medical condition.  Care discussed with DR. [employer] ordered, independently viewed and interpreted by me.  Labs remarkable for reassuring labs -The patient was maintained on a cardiac monitor.  I personally viewed and interpreted the cardiac monitored which showed an underlying rhythm of: NSR -Imaging is currently pending -This patient presents to the ED for concern of headache/chest pain/abd pain/back pain, this involves an extensive number of treatment options, and is a complaint that carries with it a high risk of complications and morbidity.  The differential diagnosis includes dissection, MI, PE -Co morbidities that complicate the patient evaluation includes HLD, GERD -Treatment includes NS, zofran, morphine -Reevaluation of the patient after these medicines showed that the patient improved -PCP office notes or outside notes reviewed -Discussion with oncoming team who will continue work up -Escalation to admission/observation considered: dispo pending.          Final Clinical Impression(s) / ED Diagnoses Final diagnoses:  None    Rx / DC Orders ED Discharge Orders     None         Fayrene Helper, PA-C 09/20/23 1532    Rondel Baton, MD 09/20/23 [phone]

## 2023-09-20 NOTE — Discharge Instructions (Addendum)
Return if any problems.  Tylenol every 4 hours.  Return if any problems.   

## 2024-03-27 ENCOUNTER — Other Ambulatory Visit: Payer: Self-pay

## 2024-03-27 ENCOUNTER — Encounter (HOSPITAL_COMMUNITY): Payer: Self-pay

## 2024-03-27 ENCOUNTER — Emergency Department (HOSPITAL_COMMUNITY)
Admission: EM | Admit: 2024-03-27 | Discharge: 2024-03-28 | Disposition: A | Discharge status: Home / Self Care | Attending: Emergency Medicine | Admitting: Emergency Medicine

## 2024-03-27 DIAGNOSIS — M79601 Pain in right arm: Secondary | ICD-10-CM | POA: Diagnosis not present

## 2024-03-27 DIAGNOSIS — R109 Unspecified abdominal pain: Secondary | ICD-10-CM | POA: Diagnosis present

## 2024-03-27 DIAGNOSIS — R1110 Vomiting, unspecified: Secondary | ICD-10-CM | POA: Insufficient documentation

## 2024-03-27 DIAGNOSIS — M79602 Pain in left arm: Secondary | ICD-10-CM | POA: Diagnosis not present

## 2024-03-27 DIAGNOSIS — R1084 Generalized abdominal pain: Secondary | ICD-10-CM | POA: Insufficient documentation

## 2024-03-27 DIAGNOSIS — R079 Chest pain, unspecified: Secondary | ICD-10-CM | POA: Diagnosis not present

## 2024-03-27 LAB — URINALYSIS, ROUTINE W REFLEX MICROSCOPIC
Bilirubin Urine: NEGATIVE
Glucose, UA: NEGATIVE mg/dL
Hgb urine dipstick: NEGATIVE
Ketones, ur: 5 mg/dL — AB
Leukocytes,Ua: NEGATIVE
Nitrite: NEGATIVE
Protein, ur: NEGATIVE mg/dL
Specific Gravity, Urine: 1.019 (ref 1.005–1.030)
pH: 7 (ref 5.0–8.0)

## 2024-03-27 LAB — COMPREHENSIVE METABOLIC PANEL WITH GFR
ALT: 18 U/L (ref 0–44)
AST: 24 U/L (ref 15–41)
Albumin: 3.5 g/dL (ref 3.5–5.0)
Alkaline Phosphatase: 31 U/L — ABNORMAL LOW (ref 38–126)
Anion gap: 12 (ref 5–15)
BUN: 12 mg/dL (ref 8–23)
CO2: 20 mmol/L — ABNORMAL LOW (ref 22–32)
Calcium: 9.5 mg/dL (ref 8.9–10.3)
Chloride: 105 mmol/L (ref 98–111)
Creatinine, Ser: 0.69 mg/dL (ref 0.44–1.00)
GFR, Estimated: >60 mL/min (ref >60)
Glucose, Bld: 105 mg/dL — ABNORMAL HIGH (ref 70–99)
Potassium: 3.1 mmol/L — ABNORMAL LOW (ref 3.5–5.1)
Sodium: 137 mmol/L (ref 135–145)
Total Bilirubin: 1 mg/dL (ref 0.0–1.2)
Total Protein: 7.7 g/dL (ref 6.5–8.1)

## 2024-03-27 LAB — TROPONIN I (HIGH SENSITIVITY)
Troponin I (High Sensitivity): 3 ng/L (ref <18)
Troponin I (High Sensitivity): 2 ng/L (ref <18)

## 2024-03-27 LAB — CBC WITH DIFFERENTIAL/PLATELET
Abs Immature Granulocytes: 0.03 10*3/uL (ref 0.00–0.07)
Basophils Absolute: 0 10*3/uL (ref 0.0–0.1)
Basophils Relative: 0 %
Eosinophils Absolute: 0 10*3/uL (ref 0.0–0.5)
Eosinophils Relative: 0 %
HCT: 35.6 % — ABNORMAL LOW (ref 36.0–46.0)
Hemoglobin: 11.5 g/dL — ABNORMAL LOW (ref 12.0–15.0)
Immature Granulocytes: 0 %
Lymphocytes Relative: 6 %
Lymphs Abs: 0.5 10*3/uL — ABNORMAL LOW (ref 0.7–4.0)
MCH: 24.1 pg — ABNORMAL LOW (ref 26.0–34.0)
MCHC: 32.3 g/dL (ref 30.0–36.0)
MCV: 74.5 fL — ABNORMAL LOW (ref 80.0–100.0)
Monocytes Absolute: 0.2 10*3/uL (ref 0.1–1.0)
Monocytes Relative: 3 %
Neutro Abs: 8.3 10*3/uL — ABNORMAL HIGH (ref 1.7–7.7)
Neutrophils Relative %: 91 %
Platelets: 293 10*3/uL (ref 150–400)
RBC: 4.78 MIL/uL (ref 3.87–5.11)
RDW: 13.5 % (ref 11.5–15.5)
WBC: 9.1 10*3/uL (ref 4.0–10.5)
nRBC: 0 % (ref 0.0–0.2)

## 2024-03-27 LAB — LIPASE, BLOOD: Lipase: 23 U/L (ref 11–51)

## 2024-03-27 MED ORDER — ALUM & MAG HYDROXIDE-SIMETH 200-200-20 MG/5ML PO SUSP
30.0000 mL | Freq: Once | ORAL | Status: AC
  Administered 2024-03-27: 30 mL via ORAL
  Filled 2024-03-27: qty 30

## 2024-03-27 MED ORDER — ONDANSETRON 4 MG PO TBDP
4.0000 mg | ORAL_TABLET | Freq: Once | ORAL | Status: AC
  Administered 2024-03-27: 4 mg via ORAL
  Filled 2024-03-27: qty 1

## 2024-03-27 NOTE — ED Provider Triage Note (Signed)
 Emergency Medicine Provider Triage Evaluation Note  [name] , a 63 y.o. female  was evaluated in triage.  Pt complains of chest pain, abdominal pain, nausea, and vomiting.  The patient was seen here yesterday for similar symptoms.  She had extensive workup including CT scan of her chest, abdomen, and pelvis that were negative.  Labs were reassuring.  Patient reports persistent vomiting.  Return to the ER today due to these persistent symptoms.  Review of Systems  Positive: Chest pain, abdominal pain Negative: Fever  Physical Exam  BP 116/69 (BP Location: Left Arm)   Pulse (!) 101   Temp 98 F (36.7 C) (Oral)   Resp 20   SpO2 100% Comment: Simultaneous filing. User may not have seen previous data. Gen:   Awake, no distress   Resp:  Normal effort  MSK:   Moves extremities without difficulty  Other:  Abdomen soft  Medical Decision Making  Medically screening exam initiated at 8:14 PM.  Appropriate orders placed.  [name] was informed that the remainder of the evaluation will be completed by another provider, this initial triage assessment does not replace that evaluation, and the importance of remaining in the ED until their evaluation is complete.  Repeat labs ordered including an EKG and troponin to evaluate for ACS.  Zofran ordered for nausea as well as GI cocktail.   Durwin Glaze, MD 03/27/24 2015

## 2024-03-27 NOTE — ED Triage Notes (Addendum)
 Patient BIB [facility] after initially calling for abdominal pain, then complained of bilateral arm pain, then chest pain. EMS started 18g in the [facility], gave 4mg  zofran for nausea, patient still c/o pain. Seen previously for same. VSS.

## 2024-03-28 ENCOUNTER — Emergency Department (HOSPITAL_COMMUNITY)

## 2024-03-28 MED ORDER — ONDANSETRON HCL 4 MG/2ML IJ SOLN
4.0000 mg | Freq: Once | INTRAMUSCULAR | Status: AC
  Administered 2024-03-28: 4 mg via INTRAVENOUS
  Filled 2024-03-28: qty 2

## 2024-03-28 MED ORDER — ALUM & MAG HYDROXIDE-SIMETH 200-200-20 MG/5ML PO SUSP: 30.0000 mL | Freq: Once | ORAL | Status: DC

## 2024-03-28 MED ORDER — SODIUM CHLORIDE 0.9 % IV BOLUS
1000.0000 mL | Freq: Once | INTRAVENOUS | Status: AC
  Administered 2024-03-28: 1000 mL via INTRAVENOUS

## 2024-03-28 MED ORDER — ONDANSETRON 4 MG PO TBDP: ORAL_TABLET | ORAL | 0 refills | Status: AC

## 2024-03-28 MED ORDER — MORPHINE SULFATE (PF) 4 MG/ML IV SOLN
4.0000 mg | Freq: Once | INTRAVENOUS | Status: AC
  Administered 2024-03-28: 4 mg via INTRAVENOUS
  Filled 2024-03-28: qty 1

## 2024-03-28 MED ORDER — IOHEXOL 350 MG/ML SOLN
75.0000 mL | Freq: Once | INTRAVENOUS | Status: AC | PRN
  Administered 2024-03-28: 75 mL via INTRAVENOUS

## 2024-03-28 NOTE — ED Provider Notes (Signed)
 [place] EMERGENCY DEPARTMENT AT [facility] Provider Note   CSN: [id number] Arrival date & time: 03/27/24  1943     History  Chief Complaint  Patient presents with   Emesis   Abdominal Pain   Chest Pain   Arm Pain    [name] is a 63 y.o. female.  63 yo F with a chief complaints of abdominal pain.  This is periumbilical.  Going on for the past week or so.  Not sure what seems to make it better or worse.  Got much worse today.  Feels like it radiated into her chest.  Having some difficulty catching her breath with.  Denies nausea or vomiting.  Feeling hot and cold off and on.   Emesis Associated symptoms: abdominal pain   Abdominal Pain Associated symptoms: chest pain and vomiting   Chest Pain Associated symptoms: abdominal pain and vomiting   Arm Pain Associated symptoms include chest pain and abdominal pain.       Home Medications Prior to Admission medications   Medication Sig Start Date End Date Taking? Authorizing Provider  ondansetron (ZOFRAN-ODT) 4 MG disintegrating tablet 4mg  ODT q4 hours prn nausea/vomit 03/28/24  Yes Melene Plan, DO  atorvastatin (LIPITOR) 40 MG tablet Take 40 mg by mouth at bedtime.     [provider]  cetirizine (ZYRTEC ALLERGY) 10 MG tablet Take 10 mg by mouth daily as needed for allergies.    [provider]  hydrochlorothiazide (HYDRODIURIL) 25 MG tablet Take 25 mg by mouth daily. 02/18/21   [provider]  levothyroxine (SYNTHROID) 50 MCG tablet Take 1 tablet (50 mcg total) by mouth daily before breakfast. Patient taking differently: Take 100 mcg by mouth daily before breakfast. 10/09/20   Joseph Art, DO  meclizine (ANTIVERT) 12.5 MG tablet Take 1 tablet (12.5 mg total) by mouth 3 (three) times daily as needed for dizziness. 06/29/23   Wynetta Fines, MD  omeprazole (PRILOSEC) 20 MG capsule Take 20 mg by mouth daily.    [provider]  PRESCRIPTION MEDICATION See admin  instructions. CPAP- At bedtime    [provider]  PROAIR HFA 108 (90 Base) MCG/ACT inhaler Inhale 2 puffs into the lungs every 6 (six) hours as needed for wheezing or shortness of breath.    [provider]  SYMBICORT 80-4.5 MCG/ACT inhaler Inhale 2 puffs into the lungs 2 (two) times daily as needed (SOB). 02/21/21   [provider]      Allergies    Patient has no known allergies.    Review of Systems   Review of Systems  Cardiovascular:  Positive for chest pain.  Gastrointestinal:  Positive for abdominal pain and vomiting.    Physical Exam Updated Vital Signs BP (!) 94/47   Pulse 88   Temp 99.2 F (37.3 C)   Resp (!) 24   SpO2 95%  Physical Exam Vitals and nursing note reviewed.  Constitutional:      General: She is not in acute distress.    Appearance: She is well-developed. She is not diaphoretic.  HENT:     Head: Normocephalic and atraumatic.  Eyes:     Pupils: Pupils are equal, round, and reactive to light.  Cardiovascular:     Rate and Rhythm: Normal rate and regular rhythm.     Heart sounds: No murmur heard.    No friction rub. No gallop.  Pulmonary:     Effort: Pulmonary effort is normal.  Breath sounds: No wheezing or rales.  Abdominal:     General: There is no distension.     Palpations: Abdomen is soft.     Tenderness: There is abdominal tenderness.     Comments: Patient points to her umbilical region.  No obvious pain along the umbilicus.  No appreciable hernia.  She does have pain focally to the right upper quadrant.  Musculoskeletal:        General: No tenderness.     Cervical back: Normal range of motion and neck supple.  Skin:    General: Skin is warm and dry.  Neurological:     Mental Status: She is alert and oriented to person, place, and time.  Psychiatric:        Behavior: Behavior normal.     ED Results / Procedures / Treatments   Labs (all labs ordered are listed, but only abnormal results are  displayed) Labs Reviewed  CBC WITH DIFFERENTIAL/PLATELET - Abnormal; Notable for the following components:      Result Value   Hemoglobin 11.5 (*)    HCT 35.6 (*)    MCV 74.5 (*)    MCH 24.1 (*)    Neutro Abs 8.3 (*)    Lymphs Abs 0.5 (*)    All other components within normal limits  COMPREHENSIVE METABOLIC PANEL WITH GFR - Abnormal; Notable for the following components:   Potassium 3.1 (*)    CO2 20 (*)    Glucose, Bld 105 (*)    Alkaline Phosphatase 31 (*)    All other components within normal limits  URINALYSIS, ROUTINE W REFLEX MICROSCOPIC - Abnormal; Notable for the following components:   Ketones, ur 5 (*)    All other components within normal limits  LIPASE, BLOOD  TROPONIN I (HIGH SENSITIVITY)  TROPONIN I (HIGH SENSITIVITY)    EKG EKG Interpretation Date/Time:  Thursday March 27 2024 20:33:24 EDT Ventricular Rate:  77 PR Interval:  156 QRS Duration:  74 QT Interval:  386 QTC Calculation: 436 R Axis:   23  Text Interpretation: Normal sinus rhythm Normal ECG No significant change since last tracing Confirmed by Melene Plan [phone]) on 03/28/2024 12:38:07 AM  Radiology CT ABDOMEN PELVIS W CONTRAST Addendum Date: 03/28/2024 ADDENDUM REPORT: 03/28/2024 03:34 ADDENDUM: 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 Electronically Signed   By: Jasmine Pang M.D.   On: 03/28/2024 03:34   Result Date: 03/28/2024 CLINICAL DATA:  Abdominal pain nausea vomiting EXAM: CT ABDOMEN AND PELVIS WITH CONTRAST TECHNIQUE: Multidetector CT imaging of the abdomen and pelvis was performed using the standard protocol following bolus administration of intravenous contrast. RADIATION DOSE REDUCTION: This exam was performed according to the departmental dose-optimization program which includes automated exposure control, adjustment of the mA and/or kV according to patient size and/or use of iterative reconstruction technique. CONTRAST:  75mL OMNIPAQUE IOHEXOL 350 MG/ML SOLN COMPARISON:  CT  09/20/2023 FINDINGS: Lower chest: Lung bases are clear. Hepatobiliary: No focal liver abnormality is seen. Status post cholecystectomy. No biliary dilatation. Probable granuloma in the posterior right hepatic lobe. Pancreas: Unremarkable. No pancreatic ductal dilatation or surrounding inflammatory changes. Spleen: Normal in size without focal abnormality. Adrenals/Urinary Tract: Adrenal glands are normal. Cortical scarring within the left greater than right kidneys. Cyst in the left kidney for which no imaging follow-up is recommended. Bilateral kidney stones, measuring up to 9 mm in the lower pole of the right kidney. The bladder is slightly thick walled but nearly empty Stomach/Bowel: The stomach is nonenlarged. No  dilated small bowel. No acute bowel wall thickening. Vascular/Lymphatic: Aortic atherosclerosis. No enlarged abdominal or pelvic lymph nodes. Reproductive: Hysterectomy.  No adnexal mass Other: Negative for pelvic effusion or free air. Moderate fat containing left periumbilical hernia. Musculoskeletal: No acute or suspicious osseous abnormality. IMPRESSION: 1. No CT evidence for acute intra-abdominal or pelvic abnormality. 2. Bilateral kidney stones without hydronephrosis. Cortical scarring within the left greater than right kidneys. 3. Moderate fat containing left Peri umbilical hernia. 4. Aortic atherosclerosis. Electronically Signed: By: Jasmine Pang M.D. On: 03/28/2024 03:05    Procedures Procedures    Medications Ordered in ED Medications  ondansetron (ZOFRAN-ODT) disintegrating tablet 4 mg (4 mg Oral Given 03/27/24 2041)  alum & mag hydroxide-simeth (MAALOX/MYLANTA) 200-200-20 MG/5ML suspension 30 mL (30 mLs Oral Given 03/27/24 2041)  morphine (PF) 4 MG/ML injection 4 mg (4 mg Intravenous Given 03/28/24 0313)  ondansetron (ZOFRAN) injection 4 mg (4 mg Intravenous Given 03/28/24 0310)  sodium chloride 0.9 % bolus 1,000 mL (1,000 mLs Intravenous New Bag/Given 03/28/24 0309)  iohexol  (OMNIPAQUE) 350 MG/ML injection 75 mL (75 mLs Intravenous Contrast Given 03/28/24 0253)    ED Course/ Medical Decision Making/ A&P                                 Medical Decision Making Amount and/or Complexity of Data Reviewed Radiology: ordered.  Risk Prescription drug management.   63 yo F with a chief complaints of abdominal pain.  Going on for about a week or so but much worse today.  She does have focal right upper quadrant tenderness.  I did offer to obtain imaging.  She is currently declining.  Will treat pain and nausea.  Bolus of IV fluids.  Reassess.  LFTs and lipase are unremarkable.  No leukocytosis.  No acute anemia.  Troponin negative x 2.  UA negative for infection.   Patient did elect to have imaging performed.  CT abdomen pelvis without obvious acute intra-abdominal pathology.  Discharged home.  4:15 AM:  I have discussed the diagnosis/risks/treatment options with the patient.  Evaluation and diagnostic testing in the emergency department does not suggest an emergent condition requiring admission or immediate intervention beyond what has been performed at this time.  They will follow up with PCP. We also discussed returning to the ED immediately if new or worsening sx occur. We discussed the sx which are most concerning (e.g., sudden worsening pain, fever, inability to tolerate by mouth) that necessitate immediate return. Medications administered to the patient during their visit and any new prescriptions provided to the patient are listed below.  Medications given during this visit Medications  ondansetron (ZOFRAN-ODT) disintegrating tablet 4 mg (4 mg Oral Given 03/27/24 2041)  alum & mag hydroxide-simeth (MAALOX/MYLANTA) 200-200-20 MG/5ML suspension 30 mL (30 mLs Oral Given 03/27/24 2041)  morphine (PF) 4 MG/ML injection 4 mg (4 mg Intravenous Given 03/28/24 0313)  ondansetron (ZOFRAN) injection 4 mg (4 mg Intravenous Given 03/28/24 0310)  sodium chloride 0.9 % bolus  1,000 mL (1,000 mLs Intravenous New Bag/Given 03/28/24 0309)  iohexol (OMNIPAQUE) 350 MG/ML injection 75 mL (75 mLs Intravenous Contrast Given 03/28/24 0253)     The patient appears reasonably screen and/or stabilized for discharge and I doubt any other medical condition or other [facility] requiring further screening, evaluation, or treatment in the ED at this time prior to discharge.          Final Clinical Impression(s) /  ED Diagnoses Final diagnoses:  Generalized abdominal pain    Rx / DC Orders ED Discharge Orders          Ordered    ondansetron (ZOFRAN-ODT) 4 MG disintegrating tablet        03/28/24 0308              Melene Plan, DO 03/28/24 [phone]

## 2024-03-28 NOTE — Discharge Instructions (Signed)
Try pepcid or tagamet up to twice a day.  Try to avoid things that may make this worse, most commonly these are spicy foods tomato based products fatty foods chocolate and peppermint.  Alcohol and tobacco can also make this worse.  Return to the emergency department for sudden worsening pain fever or inability to eat or drink.  

## 2024-03-28 NOTE — ED Notes (Signed)
 Pt verbalized understanding of discharge instructions. Pt wheeled from ed. Pt family assisted pt to car. Family to drive home.

## 2024-05-09 ENCOUNTER — Emergency Department (HOSPITAL_COMMUNITY)
Admission: EM | Admit: 2024-05-09 | Discharge: 2024-05-09 | Disposition: A | Discharge status: Home / Self Care | Attending: Emergency Medicine | Admitting: Emergency Medicine

## 2024-05-09 ENCOUNTER — Other Ambulatory Visit: Payer: Self-pay

## 2024-05-09 ENCOUNTER — Encounter (HOSPITAL_COMMUNITY): Payer: Self-pay

## 2024-05-09 ENCOUNTER — Emergency Department (HOSPITAL_COMMUNITY)

## 2024-05-09 DIAGNOSIS — E039 Hypothyroidism, unspecified: Secondary | ICD-10-CM | POA: Diagnosis not present

## 2024-05-09 DIAGNOSIS — K5790 Diverticulosis of intestine, part unspecified, without perforation or abscess without bleeding: Secondary | ICD-10-CM | POA: Diagnosis not present

## 2024-05-09 DIAGNOSIS — R1084 Generalized abdominal pain: Secondary | ICD-10-CM

## 2024-05-09 DIAGNOSIS — J45909 Unspecified asthma, uncomplicated: Secondary | ICD-10-CM | POA: Insufficient documentation

## 2024-05-09 DIAGNOSIS — N200 Calculus of kidney: Secondary | ICD-10-CM | POA: Diagnosis not present

## 2024-05-09 DIAGNOSIS — E876 Hypokalemia: Secondary | ICD-10-CM | POA: Diagnosis not present

## 2024-05-09 LAB — COMPREHENSIVE METABOLIC PANEL WITH GFR
ALT: 17 U/L (ref 0–44)
AST: 19 U/L (ref 15–41)
Albumin: 3.4 g/dL — ABNORMAL LOW (ref 3.5–5.0)
Alkaline Phosphatase: 36 U/L — ABNORMAL LOW (ref 38–126)
Anion gap: 7 (ref 5–15)
BUN: 13 mg/dL (ref 8–23)
CO2: 26 mmol/L (ref 22–32)
Calcium: 9.2 mg/dL (ref 8.9–10.3)
Chloride: 104 mmol/L (ref 98–111)
Creatinine, Ser: 0.78 mg/dL (ref 0.44–1.00)
GFR, Estimated: >60 mL/min (ref >60)
Glucose, Bld: 99 mg/dL (ref 70–99)
Potassium: 3.4 mmol/L — ABNORMAL LOW (ref 3.5–5.1)
Sodium: 137 mmol/L (ref 135–145)
Total Bilirubin: 0.4 mg/dL (ref 0.0–1.2)
Total Protein: 7.5 g/dL (ref 6.5–8.1)

## 2024-05-09 LAB — CBC
HCT: 35.9 % — ABNORMAL LOW (ref 36.0–46.0)
Hemoglobin: 11.4 g/dL — ABNORMAL LOW (ref 12.0–15.0)
MCH: 24.2 pg — ABNORMAL LOW (ref 26.0–34.0)
MCHC: 31.8 g/dL (ref 30.0–36.0)
MCV: 76.1 fL — ABNORMAL LOW (ref 80.0–100.0)
Platelets: 261 10*3/uL (ref 150–400)
RBC: 4.72 MIL/uL (ref 3.87–5.11)
RDW: 14.7 % (ref 11.5–15.5)
WBC: 7.3 10*3/uL (ref 4.0–10.5)
nRBC: 0 % (ref 0.0–0.2)

## 2024-05-09 LAB — URINALYSIS, ROUTINE W REFLEX MICROSCOPIC
Bilirubin Urine: NEGATIVE
Glucose, UA: NEGATIVE mg/dL
Hgb urine dipstick: NEGATIVE
Ketones, ur: NEGATIVE mg/dL
Leukocytes,Ua: NEGATIVE
Nitrite: NEGATIVE
Protein, ur: NEGATIVE mg/dL
Specific Gravity, Urine: 1.016 (ref 1.005–1.030)
pH: 6 (ref 5.0–8.0)

## 2024-05-09 LAB — LIPASE, BLOOD: Lipase: 29 U/L (ref 11–51)

## 2024-05-09 NOTE — ED Provider Notes (Signed)
 [place] EMERGENCY DEPARTMENT AT [facility] Provider Note   CSN: [id number] Arrival date & time: 05/09/24  1055     History  Chief Complaint  Patient presents with   Flank Pain    [name] is a 63 y.o. female.   Flank Pain     Patient has history of thyroid disease asthma hyperlipidemia hypothyroidism acid reflux.  She has been having pain on the right side of her abdomen for the last week.  Patient states its mostly in the right upper quadrant.  She has not had any nausea vomiting or diarrhea.  She does not notice symptoms getting worse with eating or drinking.  She went to her doctor's office today and was instructed to come to the ED.  Home Medications Prior to Admission medications   Medication Sig Start Date End Date Taking? Authorizing Provider  atorvastatin  (LIPITOR) 40 MG tablet Take 40 mg by mouth at bedtime.     [provider]  cetirizine (ZYRTEC ALLERGY) 10 MG tablet Take 10 mg by mouth daily as needed for allergies.    [provider]  hydrochlorothiazide  (HYDRODIURIL ) 25 MG tablet Take 25 mg by mouth daily. 02/18/21   [provider]  levothyroxine  (SYNTHROID ) 50 MCG tablet Take 1 tablet (50 mcg total) by mouth daily before breakfast. Patient taking differently: Take 100 mcg by mouth daily before breakfast. 10/09/20   Enrigue Harvard, DO  meclizine  (ANTIVERT ) 12.5 MG tablet Take 1 tablet (12.5 mg total) by mouth 3 (three) times daily as needed for dizziness. 06/29/23   Burnette Carte, MD  omeprazole  (PRILOSEC) 20 MG capsule Take 20 mg by mouth daily.    [provider]  ondansetron  (ZOFRAN -ODT) 4 MG disintegrating tablet 4mg  ODT q4 hours prn nausea/vomit 03/28/24   Floyd, Dan, DO  PRESCRIPTION MEDICATION See admin instructions. CPAP- At bedtime    [provider]  PROAIR  HFA 108 (90 Base) MCG/ACT inhaler Inhale 2 puffs into the lungs every 6 (six) hours as needed for wheezing or shortness of breath.     [provider]  SYMBICORT 80-4.5 MCG/ACT inhaler Inhale 2 puffs into the lungs 2 (two) times daily as needed (SOB). 02/21/21   [provider]      Allergies    Patient has no known allergies.    Review of Systems   Review of Systems  Genitourinary:  Positive for flank pain.    Physical Exam Updated Vital Signs BP 100/65   Pulse 65   Temp 97.6 F (36.4 C) (Oral)   Resp 15   Ht 1.448 m (4\' 9" )   Wt 78.5 kg   SpO2 100%   BMI 37.44 kg/m  Physical Exam Vitals and nursing note reviewed.  Constitutional:      General: She is not in acute distress.    Appearance: She is well-developed.  HENT:     Head: Normocephalic and atraumatic.     Right Ear: External ear normal.     Left Ear: External ear normal.  Eyes:     General: No scleral icterus.       Right eye: No discharge.        Left eye: No discharge.     Conjunctiva/sclera: Conjunctivae normal.  Neck:     Trachea: No tracheal deviation.  Cardiovascular:     Rate and Rhythm: Normal rate and regular rhythm.  Pulmonary:     Effort: Pulmonary effort is normal. No respiratory distress.  Breath sounds: Normal breath sounds. No stridor. No wheezing or rales.  Abdominal:     General: Bowel sounds are normal. There is no distension.     Palpations: Abdomen is soft.     Tenderness: There is abdominal tenderness. There is no guarding or rebound.     Comments: Ttp ruq  Musculoskeletal:        General: No tenderness or deformity.     Cervical back: Neck supple.  Skin:    General: Skin is warm and dry.     Findings: No rash.  Neurological:     General: No focal deficit present.     Mental Status: She is alert.     Cranial Nerves: No cranial nerve deficit, dysarthria or facial asymmetry.     Sensory: No sensory deficit.     Motor: No abnormal muscle tone or seizure activity.     Coordination: Coordination normal.  Psychiatric:        Mood and Affect: Mood normal.     ED Results / Procedures /  Treatments   Labs (all labs ordered are listed, but only abnormal results are displayed) Labs Reviewed  COMPREHENSIVE METABOLIC PANEL WITH GFR - Abnormal; Notable for the following components:      Result Value   Potassium 3.4 (*)    Albumin 3.4 (*)    Alkaline Phosphatase 36 (*)    All other components within normal limits  CBC - Abnormal; Notable for the following components:   Hemoglobin 11.4 (*)    HCT 35.9 (*)    MCV 76.1 (*)    MCH 24.2 (*)    All other components within normal limits  LIPASE, BLOOD  URINALYSIS, ROUTINE W REFLEX MICROSCOPIC    EKG EKG Interpretation Date/Time:  Friday May 09 2024 11:03:30 EDT Ventricular Rate:  72 PR Interval:  164 QRS Duration:  74 QT Interval:  382 QTC Calculation: 418 R Axis:   16  Text Interpretation: Normal sinus rhythm When compared with ECG of 27-Mar-2024 20:33, No significant change since last tracing Confirmed by Trish Furl [phone]) on 05/09/2024 11:53:20 AM  Radiology CT Renal Stone Study Result Date: 05/09/2024 CLINICAL DATA:  Abdominal/flank pain, stone suspected EXAM: CT ABDOMEN AND PELVIS WITHOUT CONTRAST TECHNIQUE: Multidetector CT imaging of the abdomen and pelvis was performed following the standard protocol without IV contrast. RADIATION DOSE REDUCTION: This exam was performed according to the departmental dose-optimization program which includes automated exposure control, adjustment of the mA and/or kV according to patient size and/or use of iterative reconstruction technique. COMPARISON:  March 28, 2024 FINDINGS: Of note, the lack of intravenous contrast limits evaluation of the solid organ parenchyma and vascularity. Lower chest: No focal airspace consolidation or pleural effusion. Hepatobiliary: No mass.Cholecystectomy. No intrahepatic or extrahepatic biliary ductal dilation. Pancreas: No mass or main ductal dilation. No peripancreatic inflammation or fluid collection. Spleen: Normal size. No mass. Adrenals/Urinary Tract:  No adrenal masses. Multiple bilateral renal cysts measuring up to 4 cm on the left. Proteinaceous or hemorrhagic cyst in the upper pole of the left kidney measuring 1.1 cm. Nonobstructive bilateral calyceal calculi. The largest in the right lower pole measures 1.1 cm. No hydronephrosis. Circumferential wall thickening of the urinary bladder. Stomach/Bowel: The stomach is decompressed without focal abnormality. No small bowel wall thickening or inflammation. No small bowel obstruction.No findings to suggest acute appendicitis. Descending colonic diverticulosis. No changes of acute diverticulitis. Vascular/Lymphatic: No aortic aneurysm. Scattered aortoiliac atherosclerosis. No intraabdominal or pelvic lymphadenopathy. Reproductive: Hysterectomy. No concerning adnexal  mass. No free pelvic fluid. Other: No pneumoperitoneum, ascites, or mesenteric inflammation. Musculoskeletal: No acute fracture or destructive lesion. Moderate-sized fat containing left paraumbilical ventral hernia measuring 6.9 cm in transverse dimension. No findings to suggest vascular compromise. Multilevel degenerative disc disease of the spine. Diffuse osteopenia. Moderate osteoarthritis of the hips. IMPRESSION: 1. Circumferential wall thickening of the urinary bladder, which may be due to underdistension or acute cystitis. Correlation with urinalysis is recommended. 2. Nonobstructive bilateral nephrolithiasis. The largest calculus in the right lower pole measures 1.1 cm. No hydronephrosis. 3. Descending colonic diverticulosis. No changes of acute diverticulitis. Aortic Atherosclerosis (ICD10-I70.0). Electronically Signed   By: Rance Burrows M.D.   On: 05/09/2024 18:15   US  Abdomen Limited RUQ (LIVER/GB) Result Date: 05/09/2024 CLINICAL DATA:  Abdominal pain.  Prior cholecystectomy EXAM: ULTRASOUND ABDOMEN LIMITED RIGHT UPPER QUADRANT COMPARISON:  CT 03/28/2024 FINDINGS: Gallbladder: Previous cholecystectomy Common bile duct: Diameter: 6 mm the  sonographer reports some questionable echogenic material along the common duct. Difficult to confirm. Liver: No focal lesion identified. Within normal limits in parenchymal echogenicity. Portal vein is patent on color Doppler imaging with normal direction of blood flow towards the liver. Other: None. IMPRESSION: Previous cholecystectomy. No ductal dilatation. Questionable echogenic material in the common duct reported by the sonographer is difficult to confirm on these images. Follow up MRCP could be performed to confirm presence or absence of abnormality. Electronically Signed   By: Adrianna Horde M.D.   On: 05/09/2024 14:19    Procedures Procedures    Medications Ordered in ED Medications - No data to display  ED Course/ Medical Decision Making/ A&P Clinical Course as of 05/10/24 0817  Fri May 09, 2024  1327 Comprehensive metabolic panel(!) No significant electrolyte abnormalities.  CBC unremarkable [JK]  1457 Ultrasound shows prior cholecystectomy no ductal dilatation noted. [JK]    Clinical Course User Index [JK] Trish Furl, MD                                 Medical Decision Making Problems Addressed: Generalized abdominal pain: acute illness or injury that poses a threat to life or bodily functions  Amount and/or Complexity of Data Reviewed Labs: ordered. Decision-making details documented in ED Course. Radiology: ordered and independent interpretation performed.   Patient presented to the ED for evaluation of right sided flank abdominal pain.  Patient noted to have tenderness palpation primarily in the right upper quadrant.  Patient's ED workup reassuring.  No evidence of hepatitis.  No signs of choledocholithiasis or other abnormality noted on her right upper quadrant ultrasound.  No evidence of pancreatitis.  CT scan was ordered to rule out ureteral stone.  Care turned over to Dr. Amey Ka at shift change.  Anticipate discharge if CT scan reassuring.        Final Clinical  Impression(s) / ED Diagnoses Final diagnoses:  Generalized abdominal pain  Kidney stones  Diverticulosis    Rx / DC Orders ED Discharge Orders     None         Trish Furl, MD 05/10/24 [phone]

## 2024-05-09 NOTE — ED Provider Notes (Signed)
 63 yo female w/ hx of cholecystectomy here with right flank pain Labs at baseline Pending CT renal stone study  Physical Exam  BP 100/65   Pulse 65   Temp 97.6 F (36.4 C) (Oral)   Resp 15   Ht 4\' 9"  (1.448 m)   Wt 78.5 kg   SpO2 100%   BMI 37.44 kg/m   Physical Exam  Procedures  Procedures  ED Course / MDM   Clinical Course as of 05/09/24 1856  Fri May 09, 2024  1327 Comprehensive metabolic panel(!) No significant electrolyte abnormalities.  CBC unremarkable [JK]  1457 Ultrasound shows prior cholecystectomy no ductal dilatation noted. [JK]    Clinical Course User Index [JK] Trish Furl, MD   Medical Decision Making Amount and/or Complexity of Data Reviewed Labs: ordered. Decision-making details documented in ED Course. Radiology: ordered.   CT imaging is unremarkable.  The patient is remained stable during her nearly 8-hour stay in the ED.  Her pain is quite minimal and not even present currently.  She reports that she has been taken Advil  at home which seems to help her pain over the past week, not specifically it is worse when walking.  I suspect this most likely a musculoskeletal issue.  I think she is stable for discharge with PCP follow-up.  I did discuss the incidental findings on her CT scan including kidney stones, but there is no evidence that she is passing 1 now.  UA does not show signs of infection, and she does not have any dysuria or UTI symptoms       Arvilla Birmingham, MD 05/09/24 [phone]

## 2024-05-09 NOTE — Discharge Instructions (Addendum)
 Please be aware that your CT scan showed that you have kidney stones inside your kidneys.  You are not passing these, and these are not the cause of your current pain.  But you may want to read about the information about kidney stones for the future.  Please follow-up with your doctor in 1 week to recheck your symptoms

## 2024-05-09 NOTE — ED Triage Notes (Signed)
 Pt here for right flank pain that started yesterday. Denies CP. Denies n/v/d

## 2024-05-20 ENCOUNTER — Other Ambulatory Visit: Payer: Self-pay | Admitting: Physician Assistant

## 2024-05-20 DIAGNOSIS — Z1231 Encounter for screening mammogram for malignant neoplasm of breast: Secondary | ICD-10-CM

## 2024-06-03 ENCOUNTER — Encounter: Payer: Self-pay | Admitting: Physician Assistant

## 2024-06-03 NOTE — Progress Notes (Deleted)
 ANNUAL EXAM Patient name: [name] MRN [id number]  Date of birth: 04-Jun-1961 Chief Complaint:   No chief complaint on file.  History of Present Illness:   [name] is a 63 y.o. No obstetric history on file. {race:25618} female being seen today for a routine annual exam.   Current complaints: ***  No LMP recorded. Patient has had a hysterectomy.  The pregnancy intention screening data noted above was reviewed. Potential methods of contraception were discussed. The patient elected to proceed with No data recorded.   Last pap ***. Results were: {Pap findings:25134}. H/O abnormal pap: {yes/yes***/no:23866} Last mammogram: 03/17/2016. Results were: normal. Family h/o breast cancer: {yes***/no:23838} Last colonoscopy: ***. Results were: {normal, abnormal, n/a:23837}. Family h/o colorectal cancer: {yes***/no:23838} Surgical history: Total abdominal hysterectomy- 2008      No data to display               No data to display           Review of Systems:   Pertinent items are noted in HPI Denies any headaches, blurred vision, fatigue, shortness of breath, chest pain, abdominal pain, abnormal vaginal discharge/itching/odor/irritation, problems with periods, bowel movements, urination, or intercourse unless otherwise stated above. Pertinent History Reviewed:  Reviewed past medical,surgical, social and family history.  Reviewed problem list, medications and allergies. Physical Assessment:  There were no vitals filed for this visit.There is no height or weight on file to calculate BMI.        Physical Examination:   General appearance - well appearing, and in no distress  Mental status - alert, oriented to person, place, and time  Psych:  She has a normal mood and affect  Skin - warm and dry, normal color, no suspicious lesions noted  Chest - effort normal, all lung fields clear to auscultation bilaterally  Heart - normal rate and regular rhythm  Neck:   midline trachea, no thyromegaly or nodules  Breasts - breasts appear normal, no suspicious masses, no skin or nipple changes or  axillary nodes  Abdomen - soft, nontender, nondistended, no masses or organomegaly  Pelvic - VULVA: normal appearing vulva with no masses, tenderness or lesions  VAGINA: normal appearing vagina with normal color and discharge, no lesions  CERVIX: normal appearing cervix without discharge or lesions, no CMT  Thin prep pap is {Desc; done/not:10129} *** HR HPV cotesting  UTERUS: uterus is felt to be normal size, shape, consistency and nontender   ADNEXA: No adnexal masses or tenderness noted.  Rectal - normal rectal, good sphincter tone, no masses felt. Hemoccult: ***  Extremities:  No swelling or varicosities noted  Chaperone present for exam  No results found for this or any previous visit (from the past 24 hours).  Assessment & Plan:  1. Encounter for annual routine gynecological examination (Primary)  - Cervical cancer screening: Discussed guidelines. Pap with HPV *** - Breast Health: Encouraged self breast awareness/SBE. Discussed limits of clinical breast exam for detecting breast cancer. Discussed importance of annual MXR. {Blank single:19197::Rx given for [facility] is up to date: ***} - Climacteric/Sexual health: Reviewed typical and atypical symptoms of menopause/peri-menopause. Discussed PMB and to call if any amount of spotting.  - Colonoscopy: {Blank single:19197::Per PCP,up to date,declines} - F/U 12 months and prn   No orders of the defined types were placed in this encounter.   Meds: No orders of the defined types were placed in this encounter.   Follow-up: No follow-ups on file.  Jhane Lorio E Ohm Dentler, PA-C  06/03/2024 5:11 PM

## 2024-06-05 ENCOUNTER — Ambulatory Visit
Admission: RE | Admit: 2024-06-05 | Discharge: 2024-06-05 | Disposition: A | Discharge status: Home / Self Care | Source: Ambulatory Visit | Attending: Physician Assistant | Admitting: Physician Assistant

## 2024-06-05 DIAGNOSIS — Z1231 Encounter for screening mammogram for malignant neoplasm of breast: Secondary | ICD-10-CM

## 2024-06-06 ENCOUNTER — Encounter: Admitting: Physician Assistant

## 2024-06-06 DIAGNOSIS — Z01419 Encounter for gynecological examination (general) (routine) without abnormal findings: Secondary | ICD-10-CM
